# Patient Record
Sex: Male | Born: 1935 | Race: White | Hispanic: No | Smoking: Former smoker
Health system: Southern US, Community
[De-identification: ages and names within clinical notes are randomized; demographics above are authoritative.]

## PROBLEM LIST (undated history)

## (undated) DIAGNOSIS — Z860100 Personal history of colon polyps, unspecified: Secondary | ICD-10-CM

## (undated) DIAGNOSIS — I10 Essential (primary) hypertension: Secondary | ICD-10-CM

## (undated) DIAGNOSIS — K5792 Diverticulitis of intestine, part unspecified, without perforation or abscess without bleeding: Secondary | ICD-10-CM

## (undated) DIAGNOSIS — K219 Gastro-esophageal reflux disease without esophagitis: Secondary | ICD-10-CM

## (undated) DIAGNOSIS — Z8719 Personal history of other diseases of the digestive system: Secondary | ICD-10-CM

## (undated) DIAGNOSIS — G459 Transient cerebral ischemic attack, unspecified: Secondary | ICD-10-CM

## (undated) DIAGNOSIS — C801 Malignant (primary) neoplasm, unspecified: Secondary | ICD-10-CM

## (undated) HISTORY — PX: HERNIA REPAIR: SHX51

## (undated) HISTORY — PX: COLONOSCOPY: SHX174

## (undated) HISTORY — PX: SKIN CANCER EXCISION: SHX779

## (undated) HISTORY — PX: ESOPHAGOGASTRODUODENOSCOPY: SHX1529

---

## 2006-03-19 ENCOUNTER — Ambulatory Visit: Payer: Self-pay | Admitting: Unknown Physician Specialty

## 2008-08-17 ENCOUNTER — Ambulatory Visit: Payer: Self-pay | Admitting: Unknown Physician Specialty

## 2008-11-09 ENCOUNTER — Ambulatory Visit: Payer: Self-pay

## 2009-02-01 ENCOUNTER — Ambulatory Visit: Payer: Self-pay | Admitting: Neurology

## 2009-09-18 ENCOUNTER — Ambulatory Visit: Payer: Self-pay | Admitting: Internal Medicine

## 2009-09-22 ENCOUNTER — Ambulatory Visit: Payer: Self-pay | Admitting: Internal Medicine

## 2009-11-06 ENCOUNTER — Ambulatory Visit: Payer: Self-pay | Admitting: Internal Medicine

## 2012-02-01 ENCOUNTER — Emergency Department: Payer: Self-pay | Admitting: Emergency Medicine

## 2012-02-01 LAB — COMPREHENSIVE METABOLIC PANEL
Alkaline Phosphatase: 50 U/L (ref 50–136)
Anion Gap: 13 (ref 7–16)
BUN: 31 mg/dL — ABNORMAL HIGH (ref 7–18)
Calcium, Total: 8.5 mg/dL (ref 8.5–10.1)
Co2: 24 mmol/L (ref 21–32)
EGFR (Non-African Amer.): >60
Glucose: 98 mg/dL (ref 65–99)
Osmolality: 284 (ref 275–301)
Potassium: 4 mmol/L (ref 3.5–5.1)
Sodium: 139 mmol/L (ref 136–145)

## 2012-02-01 LAB — CBC
RBC: 3.79 10*6/uL — ABNORMAL LOW (ref 4.40–5.90)
RDW: 12.4 % (ref 11.5–14.5)
WBC: 10.4 10*3/uL (ref 3.8–10.6)

## 2012-02-01 LAB — URINALYSIS, COMPLETE
Bacteria: NONE SEEN
Blood: NEGATIVE
Glucose,UR: NEGATIVE mg/dL (ref 0–75)
Protein: NEGATIVE
Specific Gravity: 1.02 (ref 1.003–1.030)
WBC UR: 3 /HPF (ref 0–5)

## 2012-02-01 LAB — TROPONIN I: Troponin-I: <0.02 ng/mL

## 2012-08-26 ENCOUNTER — Other Ambulatory Visit: Payer: Self-pay | Admitting: Orthopedic Surgery

## 2012-08-26 DIAGNOSIS — M542 Cervicalgia: Secondary | ICD-10-CM

## 2012-08-30 ENCOUNTER — Ambulatory Visit
Admission: RE | Admit: 2012-08-30 | Discharge: 2012-08-30 | Disposition: A | Discharge status: Home / Self Care | Payer: Medicare Other | Source: Ambulatory Visit | Attending: Orthopedic Surgery | Admitting: Orthopedic Surgery

## 2012-08-30 DIAGNOSIS — M542 Cervicalgia: Secondary | ICD-10-CM

## 2012-08-31 ENCOUNTER — Other Ambulatory Visit: Payer: Self-pay

## 2013-05-08 ENCOUNTER — Emergency Department: Payer: Self-pay | Admitting: Emergency Medicine

## 2013-05-08 LAB — CBC
HGB: 12.9 g/dL — ABNORMAL LOW (ref 13.0–18.0)
MCHC: 33.8 g/dL (ref 32.0–36.0)
MCV: 94 fL (ref 80–100)
Platelet: 147 10*3/uL — ABNORMAL LOW (ref 150–440)
RBC: 4.06 10*6/uL — ABNORMAL LOW (ref 4.40–5.90)
RDW: 12.8 % (ref 11.5–14.5)

## 2013-05-08 LAB — URINALYSIS, COMPLETE
Bacteria: NONE SEEN
Bilirubin,UR: NEGATIVE
Ketone: NEGATIVE
Nitrite: NEGATIVE
Ph: 5 (ref 4.5–8.0)
RBC,UR: <1 /HPF (ref 0–5)
Specific Gravity: 1.019 (ref 1.003–1.030)
Squamous Epithelial: NONE SEEN

## 2013-05-08 LAB — COMPREHENSIVE METABOLIC PANEL
Anion Gap: 4 — ABNORMAL LOW (ref 7–16)
Calcium, Total: 8.5 mg/dL (ref 8.5–10.1)
Glucose: 111 mg/dL — ABNORMAL HIGH (ref 65–99)
Osmolality: 288 (ref 275–301)
Potassium: 3.9 mmol/L (ref 3.5–5.1)
Sodium: 142 mmol/L (ref 136–145)
Total Protein: 6.6 g/dL (ref 6.4–8.2)

## 2013-05-12 ENCOUNTER — Ambulatory Visit: Payer: Self-pay | Admitting: Internal Medicine

## 2013-05-25 ENCOUNTER — Ambulatory Visit: Payer: Self-pay | Admitting: Internal Medicine

## 2014-05-19 ENCOUNTER — Ambulatory Visit: Payer: Self-pay | Admitting: Unknown Physician Specialty

## 2014-05-20 LAB — PATHOLOGY REPORT

## 2014-07-18 ENCOUNTER — Ambulatory Visit: Payer: Self-pay | Admitting: Orthopedic Surgery

## 2016-02-08 ENCOUNTER — Encounter: Payer: Self-pay | Admitting: Emergency Medicine

## 2016-02-08 ENCOUNTER — Inpatient Hospital Stay
Admission: EM | Admit: 2016-02-08 | Discharge: 2016-02-11 | DRG: 436 | Disposition: A | Discharge status: Home / Self Care | Payer: Medicare Other | Attending: Internal Medicine | Admitting: Internal Medicine

## 2016-02-08 ENCOUNTER — Emergency Department: Payer: Medicare Other

## 2016-02-08 DIAGNOSIS — R630 Anorexia: Secondary | ICD-10-CM | POA: Diagnosis not present

## 2016-02-08 DIAGNOSIS — Z87891 Personal history of nicotine dependence: Secondary | ICD-10-CM | POA: Diagnosis not present

## 2016-02-08 DIAGNOSIS — Z6822 Body mass index (BMI) 22.0-22.9, adult: Secondary | ICD-10-CM | POA: Diagnosis not present

## 2016-02-08 DIAGNOSIS — B9789 Other viral agents as the cause of diseases classified elsewhere: Secondary | ICD-10-CM | POA: Diagnosis present

## 2016-02-08 DIAGNOSIS — I959 Hypotension, unspecified: Secondary | ICD-10-CM | POA: Diagnosis not present

## 2016-02-08 DIAGNOSIS — R17 Unspecified jaundice: Secondary | ICD-10-CM

## 2016-02-08 DIAGNOSIS — K759 Inflammatory liver disease, unspecified: Secondary | ICD-10-CM | POA: Diagnosis present

## 2016-02-08 DIAGNOSIS — N390 Urinary tract infection, site not specified: Secondary | ICD-10-CM | POA: Diagnosis present

## 2016-02-08 DIAGNOSIS — C259 Malignant neoplasm of pancreas, unspecified: Principal | ICD-10-CM | POA: Diagnosis present

## 2016-02-08 DIAGNOSIS — C787 Secondary malignant neoplasm of liver and intrahepatic bile duct: Secondary | ICD-10-CM | POA: Diagnosis present

## 2016-02-08 DIAGNOSIS — K8021 Calculus of gallbladder without cholecystitis with obstruction: Secondary | ICD-10-CM | POA: Diagnosis present

## 2016-02-08 DIAGNOSIS — N179 Acute kidney failure, unspecified: Secondary | ICD-10-CM | POA: Diagnosis present

## 2016-02-08 DIAGNOSIS — I10 Essential (primary) hypertension: Secondary | ICD-10-CM | POA: Diagnosis present

## 2016-02-08 DIAGNOSIS — Z8673 Personal history of transient ischemic attack (TIA), and cerebral infarction without residual deficits: Secondary | ICD-10-CM | POA: Diagnosis not present

## 2016-02-08 DIAGNOSIS — K831 Obstruction of bile duct: Secondary | ICD-10-CM | POA: Diagnosis present

## 2016-02-08 DIAGNOSIS — D490 Neoplasm of unspecified behavior of digestive system: Secondary | ICD-10-CM | POA: Diagnosis not present

## 2016-02-08 DIAGNOSIS — K769 Liver disease, unspecified: Secondary | ICD-10-CM | POA: Insufficient documentation

## 2016-02-08 DIAGNOSIS — E43 Unspecified severe protein-calorie malnutrition: Secondary | ICD-10-CM | POA: Insufficient documentation

## 2016-02-08 DIAGNOSIS — K838 Other specified diseases of biliary tract: Secondary | ICD-10-CM | POA: Diagnosis not present

## 2016-02-08 HISTORY — DX: Transient cerebral ischemic attack, unspecified: G45.9

## 2016-02-08 HISTORY — DX: Diverticulitis of intestine, part unspecified, without perforation or abscess without bleeding: K57.92

## 2016-02-08 HISTORY — DX: Essential (primary) hypertension: I10

## 2016-02-08 LAB — COMPREHENSIVE METABOLIC PANEL
ALK PHOS: 204 U/L — AB (ref 38–126)
ALT: 466 U/L — ABNORMAL HIGH (ref 17–63)
AST: 210 U/L — AB (ref 15–41)
Albumin: 4.1 g/dL (ref 3.5–5.0)
Anion gap: 11 (ref 5–15)
BILIRUBIN TOTAL: 9.3 mg/dL — AB (ref 0.3–1.2)
BUN: 62 mg/dL — AB (ref 6–20)
CALCIUM: 9.5 mg/dL (ref 8.9–10.3)
CO2: 24 mmol/L (ref 22–32)
Chloride: 103 mmol/L (ref 101–111)
Creatinine, Ser: 2.04 mg/dL — ABNORMAL HIGH (ref 0.61–1.24)
GFR calc Af Amer: 34 mL/min — ABNORMAL LOW (ref >60)
GFR, EST NON AFRICAN AMERICAN: 29 mL/min — AB (ref >60)
GLUCOSE: 117 mg/dL — AB (ref 65–99)
Potassium: 4.2 mmol/L (ref 3.5–5.1)
Sodium: 138 mmol/L (ref 135–145)
TOTAL PROTEIN: 7.5 g/dL (ref 6.5–8.1)

## 2016-02-08 LAB — URINALYSIS COMPLETE WITH MICROSCOPIC (ARMC ONLY)
Glucose, UA: NEGATIVE mg/dL
HGB URINE DIPSTICK: NEGATIVE
KETONES UR: NEGATIVE mg/dL
NITRITE: NEGATIVE
Protein, ur: 30 mg/dL — AB
Specific Gravity, Urine: 1.013 (ref 1.005–1.030)
Squamous Epithelial / LPF: NONE SEEN
pH: 5 (ref 5.0–8.0)

## 2016-02-08 LAB — CBC
HEMATOCRIT: 39 % — AB (ref 40.0–52.0)
Hemoglobin: 13.2 g/dL (ref 13.0–18.0)
MCH: 31.5 pg (ref 26.0–34.0)
MCHC: 33.7 g/dL (ref 32.0–36.0)
MCV: 93.4 fL (ref 80.0–100.0)
PLATELETS: 157 10*3/uL (ref 150–440)
RBC: 4.18 MIL/uL — ABNORMAL LOW (ref 4.40–5.90)
RDW: 13.8 % (ref 11.5–14.5)
WBC: 8.5 10*3/uL (ref 3.8–10.6)

## 2016-02-08 LAB — TROPONIN I

## 2016-02-08 LAB — PROTIME-INR
INR: 1.05
Prothrombin Time: 13.9 seconds (ref 11.4–15.0)

## 2016-02-08 LAB — LIPASE, BLOOD: LIPASE: 66 U/L — AB (ref 11–51)

## 2016-02-08 LAB — APTT: APTT: 26 s (ref 24–36)

## 2016-02-08 MED ORDER — SODIUM CHLORIDE 0.9 % IV BOLUS (SEPSIS)
1000.0000 mL | INTRAVENOUS | Status: AC
  Administered 2016-02-08: 1000 mL via INTRAVENOUS

## 2016-02-08 MED ORDER — ONDANSETRON HCL 4 MG/2ML IJ SOLN
4.0000 mg | Freq: Once | INTRAMUSCULAR | Status: AC
  Administered 2016-02-08: 4 mg via INTRAVENOUS
  Filled 2016-02-08: qty 2

## 2016-02-08 MED ORDER — MORPHINE SULFATE (PF) 4 MG/ML IV SOLN
4.0000 mg | Freq: Once | INTRAVENOUS | Status: AC
  Administered 2016-02-08: 4 mg via INTRAVENOUS
  Filled 2016-02-08: qty 1

## 2016-02-08 MED ORDER — DEXTROSE 5 % IV SOLN
1.0000 g | INTRAVENOUS | Status: AC
  Administered 2016-02-08: 1 g via INTRAVENOUS
  Filled 2016-02-08: qty 10

## 2016-02-08 NOTE — ED Notes (Addendum)
Pt transported to US via stretcher.  

## 2016-02-08 NOTE — ED Notes (Signed)
Pt returned from U/S via stretcher. 

## 2016-02-08 NOTE — H&P (Signed)
[place] at [place] NAME: Cody Andrade    MR#:  [id number]  DATE OF BIRTH:  [birth year]  DATE OF ADMISSION:  02/08/2016  PRIMARY CARE PHYSICIAN: Dr Loney Hering   REQUESTING/REFERRING PHYSICIAN: dr Karma Greaser  CHIEF COMPLAINT:  Not feeling well since beginning of the year, 20 pound weight loss, loss of appetite, abdominal pain  HISTORY OF PRESENT ILLNESS:  Cody Andrade  is a 82 y.o. male with a known history of hypertension, TIA comes to the emergency room from [place] clinic after he was found to be jaundiced and had hypotension. Patient has been not feeling well since been in the ER had a 20 pound weight loss along with significant loss of appetite and abdominal pain. He was recently treated with antibiotics as outpatient for acute diverticulitis. This did not seem to help with his abdominal pain. Workup in the emergency room showed patient and total bilirubin of 9.3 his LFTs were elevated and ultrasound showed biliary ductal dilatation with lesions in the liver suggestive of metastatic disease. He is being admitted for further evaluation and management. Case was discussed with Dr. Jenetta Downer from GI.  PAST MEDICAL HISTORY:   Past Medical History  Diagnosis Date  . TIA (transient ischemic attack)   . Hypertension     PAST SURGICAL HISTOIRY:  History reviewed. No pertinent past surgical history.  SOCIAL HISTORY:   Social History  Substance Use Topics  . Smoking status: Former Research scientist (life sciences)  . Smokeless tobacco: Not on file  . Alcohol Use: Yes    FAMILY HISTORY:  No family history on file.  DRUG ALLERGIES:   Allergies  Allergen Reactions  . Iodine Nausea Only  . Lodine [Etodolac] Nausea And Vomiting    REVIEW OF SYSTEMS:  Review of Systems  Constitutional: Positive for weight loss and malaise/fatigue. Negative for fever and chills.  HENT: Negative for ear discharge, ear pain and nosebleeds.   Eyes: Negative for blurred vision, pain and  discharge.       Jaundiced plus  Respiratory: Negative for sputum production, shortness of breath, wheezing and stridor.   Cardiovascular: Negative for chest pain, palpitations, orthopnea and PND.  Gastrointestinal: Positive for abdominal pain. Negative for nausea, vomiting and diarrhea.  Genitourinary: Negative for urgency and frequency.  Musculoskeletal: Negative for back pain and joint pain.  Neurological: Positive for weakness. Negative for sensory change, speech change and focal weakness.  Psychiatric/Behavioral: Negative for depression and hallucinations. The patient is not nervous/anxious.      MEDICATIONS AT HOME:   Prior to Admission medications   Not on File      VITAL SIGNS:  Blood pressure 124/70, pulse 74, temperature 97.8 F (36.6 C), temperature source Oral, resp. rate 14, height 5\' 11"  (1.803 m), weight 75.751 kg (167 lb), SpO2 100 %.  PHYSICAL EXAMINATION:  GENERAL:  82 y.o.-year-old patient lying in the bed with no acute distress.  EYES: Pupils equal, round, reactive to light and accommodation. Positive scleral icterus. Extraocular muscles intact.  HEENT: Head atraumatic, normocephalic. Oropharynx and nasopharynx clear.  NECK:  Supple, no jugular venous distention. No thyroid enlargement, no tenderness.  LUNGS: Normal breath sounds bilaterally, no wheezing, rales,rhonchi or crepitation. No use of accessory muscles of respiration.  CARDIOVASCULAR: S1, S2 normal. No murmurs, rubs, or gallops.  ABDOMEN: Soft, nontender, nondistended. Bowel sounds present. No organomegaly or mass.  EXTREMITIES: No pedal edema, cyanosis, or clubbing.  NEUROLOGIC: Cranial nerves II through XII are intact. Muscle strength 5/5 in all  extremities. Sensation intact. Gait not checked.  PSYCHIATRIC: The patient is alert and oriented x 3.  SKIN: No obvious rash, lesion, or ulcer.   LABORATORY PANEL:   CBC  Recent Labs Lab 02/08/16 1558  WBC 8.5  HGB 13.2  HCT 39.0*  PLT 157    ------------------------------------------------------------------------------------------------------------------  Chemistries   Recent Labs Lab 02/08/16 1558  NA 138  K 4.2  CL 103  CO2 24  GLUCOSE 117*  BUN 62*  CREATININE 2.04*  CALCIUM 9.5  AST 210*  ALT 466*  ALKPHOS 204*  BILITOT 9.3*   ------------------------------------------------------------------------------------------------------------------  Cardiac Enzymes  Recent Labs Lab 02/08/16 1558  TROPONINI <0.03   ------------------------------------------------------------------------------------------------------------------  RADIOLOGY:  US Abdomen Limited Ruq  02/08/2016  CLINICAL DATA:  Abdominal pain and elevated LFTs for several weeks EXAM: US ABDOMEN LIMITED - RIGHT UPPER QUADRANT COMPARISON:  None. FINDINGS: Gallbladder: Well distended with echogenic material floating within the bile consistent with gallbladder sludge and likely small stones. No wall thickening or pericholecystic fluid is noted. Common bile duct: Diameter: Dilated to 11.7 mm. Liver: Multiple hypoechoic lesions are identified within the liver. These are suspicious for underlying metastatic disease. IMPRESSION: Biliary ductal dilatation as well as changes suggestive of hepatic metastatic disease. This may be related to an underlying pancreatic lesion. CT of the abdomen and pelvis with contrast (both oral and IV) is recommended. Electronically Signed   By: Inez Catalina M.D.   On: 02/08/2016 20:12    EKG:    IMPRESSION AND PLAN:   Cody Andrade  is a 82 y.o. male with a known history of hypertension, TIA comes to the emergency room from [facility] clinic after he was found to be jaundiced and had hypotension. Patient has been not feeling well since been in the ER had a 20 pound weight loss along with significant loss of appetite and abdominal pain.  1. Obstructive jaundice with abnormal ultrasound of the abdomen -Patient presented with  weakness, malaise, loss of appetite, weight loss of 20 pounds in 2 months -Admit to medical floor -Ultrasound revealed biliary duct without limitation with lesions in the liver suggestive of possible pancreatic mass -Case discussed with Dr. Candace Cruise from GI who plans doing ERCP in the morning. Before avoid further scanning  2. Acute renal failure -IV fluids for hydration. -Patient has normal baseline creatinine comes in with creatinine of 2.04 -Avoid nephrotoxins, monitor I's and O's, monitor creatinine  3. Hypertension we'll resume home meds Pharmacy tech to place in meds in the morning. -Patient has related hypotension at present. Came in with 90/51 improve with IV hydration to 124/70  4. History of TIA We'll hold off on Aggrenox given ERCP for tomorrow  5. DVT prophylaxis Lovenox once procedure is over tomorrow  The above was discussed with patient and patient's wife and daughter were present in the emergency room   All the records are reviewed and case discussed with ED provider. Management plans discussed with the patient, family and they are in agreement.  CODE STATUS: **Full  TOTAL TIME TAKING CARE OF THIS PATIENT: 50* minutes.    Demerius Podolak M.D on 02/08/2016 at 9:31 PM  Between 7am to 6pm - Pager - [phone]  After 6pm go to [email or web address] - password [email or web address] [facility]  [facility]  Office  [phone]  CC: Primary care physician; No primary care provider on file.

## 2016-02-08 NOTE — ED Notes (Signed)
Brought over from [facility] hx of abd pain with constipation  They noticed vertigo   And low blood pressure

## 2016-02-08 NOTE — ED Provider Notes (Signed)
[facility] Emergency Department Provider Note  ____________________________________________  Time seen: Approximately 6:37 PM  I have reviewed the triage vital signs and the nursing notes.   HISTORY  Chief Complaint Abdominal Pain    HPI Cody Andrade is a 82 y.o. male who is relatively healthy for his age with a history of a prior TIA and hypertension who is sent over from [place] clinic for evaluation of abdominal pain, constipation, hypotension, and jaundice.  The patient reports that he has not been feeling well for weeks.  He was treated for probable diverticulitis about 3 weeks ago and completed his course of treatment about 5 days ago, but he states that he did not feel any better after the medications, and in fact feels worse.  He is frequently nauseated and he describes left lower quadrant and middle abdominal pain that is aching, sometimes sharp, and severe.  It is worse at night and after he eats.  He does have nausea but he has not been vomiting.  He has had minimal stools but he thinks it is because he is not having much to eat or drink.  Over the last few days he reports substantially decreased by mouth intake.  He denies fever/chills, chest pain, shortness of breath, dysuria.  He does report that his urine has been very dark.   Past Medical History  Diagnosis Date  . TIA (transient ischemic attack)   . Hypertension     There are no active problems to display for this patient.   History reviewed. No pertinent past surgical history.  No current outpatient prescriptions on file.  Allergies Lodine  No family history on file.  Social History Social History  Substance Use Topics  . Smoking status: Former Research scientist (life sciences)  . Smokeless tobacco: None  . Alcohol Use: Yes    Review of Systems Constitutional: No fever/chills.  Generalized weakness Eyes: No visual changes. ENT: No sore throat. Cardiovascular: Denies chest pain. Respiratory: Denies  shortness of breath. Gastrointestinal: Left lower quadrant and central abdominal pain with nausea, anorexia, and decreased bowel movements. Genitourinary: Negative for dysuria.  Dark colored urine. Musculoskeletal: Recent development of low back pain, bilateral. Skin: Negative for rash. Neurological: Negative for headaches, focal weakness or numbness.  10-point ROS otherwise negative.  ____________________________________________   PHYSICAL EXAM:  VITAL SIGNS: ED Triage Vitals  Enc Vitals Group     BP 02/08/16 1548 90/51 mmHg     Pulse Rate 02/08/16 1548 83     Resp 02/08/16 1548 20     Temp 02/08/16 1548 97.8 F (36.6 C)     Temp Source 02/08/16 1548 Oral     SpO2 02/08/16 1548 100 %     Weight 02/08/16 1548 167 lb (75.751 kg)     Height 02/08/16 1548 5\' 11"  (1.803 m)     Head Cir --      Peak Flow --      Pain Score 02/08/16 1549 3     Pain Loc --      Pain Edu? --      Excl. in [place]? --     Constitutional: Alert and oriented. No acute distress.  Jaundiced Eyes: Scleral icterus. PERRL. EOMI.   Head: Atraumatic. Nose: No congestion/rhinnorhea. Mouth/Throat: Mucous membranes are dry.  Oropharynx non-erythematous. Neck: No stridor.   Cardiovascular: Normal rate, regular rhythm. Grossly normal heart sounds.  Good peripheral circulation. Respiratory: Normal respiratory effort.  No retractions. Lungs CTAB. Gastrointestinal: Soft with tenderness to palpation of the  right side of his abdomen but a negative Murphy's sign.  No tenderness at McBurney's point.  No tenderness to palpation of the left lower quadrant.  No rebound nor guarding. Musculoskeletal: No lower extremity tenderness nor edema.  No joint effusions. Neurologic:  Normal speech and language. No gross focal neurologic deficits are appreciated.  Skin:  Skin is warm, dry and intact. No rash noted.  Jaundiced. Psychiatric: Mood and affect are normal. Speech and behavior are  normal.  ____________________________________________   LABS (all labs ordered are listed, but only abnormal results are displayed)  Labs Reviewed  LIPASE, BLOOD - Abnormal; Notable for the following:    Lipase 66 (*)    All other components within normal limits  COMPREHENSIVE METABOLIC PANEL - Abnormal; Notable for the following:    Glucose, Bld 117 (*)    BUN 62 (*)    Creatinine, Ser 2.04 (*)    AST 210 (*)    ALT 466 (*)    Alkaline Phosphatase 204 (*)    Total Bilirubin 9.3 (*)    GFR calc non Af Amer 29 (*)    GFR calc Af Amer 34 (*)    All other components within normal limits  CBC - Abnormal; Notable for the following:    RBC 4.18 (*)    HCT 39.0 (*)    All other components within normal limits  URINALYSIS COMPLETEWITH MICROSCOPIC ([facility] ONLY) - Abnormal; Notable for the following:    Color, Urine AMBER (*)    APPearance HAZY (*)    Bilirubin Urine 1+ (*)    Protein, ur 30 (*)    Leukocytes, UA 1+ (*)    Bacteria, UA RARE (*)    All other components within normal limits  URINE CULTURE  TROPONIN I  PROTIME-INR  APTT   ____________________________________________  EKG  ED ECG REPORT I, Vitaly Wanat, the attending physician, personally viewed and interpreted this ECG.  Date: 02/08/2016 EKG Time: 20:42 Rate: 80 Rhythm: normal sinus rhythm QRS Axis: normal Intervals: normal ST/T Wave abnormalities: normal Conduction Disturbances: none Narrative Interpretation: unremarkable  ____________________________________________  RADIOLOGY   US Abdomen Limited Ruq  02/08/2016  CLINICAL DATA:  Abdominal pain and elevated LFTs for several weeks EXAM: US ABDOMEN LIMITED - RIGHT UPPER QUADRANT COMPARISON:  None. FINDINGS: Gallbladder: Well distended with echogenic material floating within the bile consistent with gallbladder sludge and likely small stones. No wall thickening or pericholecystic fluid is noted. Common bile duct: Diameter: Dilated to 11.7 mm. Liver:  Multiple hypoechoic lesions are identified within the liver. These are suspicious for underlying metastatic disease. IMPRESSION: Biliary ductal dilatation as well as changes suggestive of hepatic metastatic disease. This may be related to an underlying pancreatic lesion. CT of the abdomen and pelvis with contrast (both oral and IV) is recommended. Electronically Signed   By: Inez Catalina M.D.   On: 02/08/2016 20:12    ____________________________________________   PROCEDURES  Procedure(s) performed: None  Critical Care performed: No ____________________________________________   INITIAL IMPRESSION / ASSESSMENT AND PLAN / ED COURSE  Pertinent labs & imaging results that were available during my care of the patient were reviewed by me and considered in my medical decision making (see chart for details).  The patient is in no acute distress but has hypotension, acute renal failure, and a severely elevated total bilirubin.  The tenderness palpation of the right side of his abdomen is also concerning although he has no Murphy's sign.  Given his acute renal failure I will  start with a right upper quadrant ultrasound and I am concerned for the possibility of neoplasm.  I am giving 1 L normal saline and ceftriaxone 1 g IV for his UTI and a urine culture was ordered.  ----------------------------------------- 8:54 PM on 02/08/2016 -----------------------------------------  The patient remains comfortable.  His ultrasound is very concerning for metastatic cancer, likely pancreatic source.  Given his renal failure there is no point in pursuing a CT scan at this time; he needs to be rehydrated and see if his creatinine comes down by tomorrow when a more appropriate imaging study can be obtained, although it is possible he has hepatorenal syndrome and his creatinine will improve.  Regardless, I will admit him for further management of his renal failure, abdominal pain, UTI, and probable new diagnosis of  pancreatic cancer with metastases to the liver.  I updated the patient and his family.  I have spoken with Dr. Posey Pronto the hospitalist who will admit.  ____________________________________________  FINAL CLINICAL IMPRESSION(S) / ED DIAGNOSES  Final diagnoses:  Acute renal failure, unspecified acute renal failure type ([place])  Total bilirubin, elevated  Hepatitis  Pancreatic cancer metastasized to liver [facility])  UTI (lower urinary tract infection)      NEW MEDICATIONS STARTED DURING THIS VISIT:  New Prescriptions   No medications on file      Note:  This document was prepared using Dragon voice recognition software and may include unintentional dictation errors.   Hinda Kehr, MD 02/08/16 2055

## 2016-02-08 NOTE — ED Notes (Signed)
Report given to Kate, RN

## 2016-02-09 ENCOUNTER — Encounter
Admission: EM | Disposition: A | Discharge status: Home / Self Care | Payer: Self-pay | Source: Home / Self Care | Attending: Internal Medicine

## 2016-02-09 ENCOUNTER — Inpatient Hospital Stay: Payer: Medicare Other | Admitting: Anesthesiology

## 2016-02-09 DIAGNOSIS — E43 Unspecified severe protein-calorie malnutrition: Secondary | ICD-10-CM | POA: Insufficient documentation

## 2016-02-09 DIAGNOSIS — D490 Neoplasm of unspecified behavior of digestive system: Secondary | ICD-10-CM

## 2016-02-09 DIAGNOSIS — K831 Obstruction of bile duct: Secondary | ICD-10-CM

## 2016-02-09 HISTORY — PX: ERCP: SHX5425

## 2016-02-09 LAB — BASIC METABOLIC PANEL
ANION GAP: 9 (ref 5–15)
BUN: 59 mg/dL — ABNORMAL HIGH (ref 6–20)
CO2: 24 mmol/L (ref 22–32)
Calcium: 8.4 mg/dL — ABNORMAL LOW (ref 8.9–10.3)
Chloride: 107 mmol/L (ref 101–111)
Creatinine, Ser: 1.72 mg/dL — ABNORMAL HIGH (ref 0.61–1.24)
GFR calc Af Amer: 41 mL/min — ABNORMAL LOW (ref >60)
GFR, EST NON AFRICAN AMERICAN: 36 mL/min — AB (ref >60)
Glucose, Bld: 96 mg/dL (ref 65–99)
POTASSIUM: 3.8 mmol/L (ref 3.5–5.1)
SODIUM: 140 mmol/L (ref 135–145)

## 2016-02-09 SURGERY — ERCP, WITH INTERVENTION IF INDICATED
Anesthesia: General

## 2016-02-09 MED ORDER — ONDANSETRON HCL 4 MG PO TABS: 4.0000 mg | ORAL_TABLET | Freq: Four times a day (QID) | ORAL | Status: DC | PRN

## 2016-02-09 MED ORDER — MIDAZOLAM HCL 5 MG/5ML IJ SOLN
INTRAMUSCULAR | Status: DC | PRN
  Administered 2016-02-09: 1 mg via INTRAVENOUS

## 2016-02-09 MED ORDER — LIDOCAINE HCL (CARDIAC) 20 MG/ML IV SOLN
INTRAVENOUS | Status: DC | PRN
  Administered 2016-02-09: 100 mg via INTRAVENOUS

## 2016-02-09 MED ORDER — INDOMETHACIN 50 MG RE SUPP
100.0000 mg | RECTAL | Status: AC
  Administered 2016-02-09: 50 mg via RECTAL
  Filled 2016-02-09: qty 2

## 2016-02-09 MED ORDER — ACETAMINOPHEN 325 MG PO TABS: 650.0000 mg | ORAL_TABLET | Freq: Four times a day (QID) | ORAL | Status: DC | PRN

## 2016-02-09 MED ORDER — SODIUM CHLORIDE 0.9 % IV SOLN
1.5000 g | Freq: Once | INTRAVENOUS | Status: AC
  Administered 2016-02-09: 1.5 g via INTRAVENOUS
  Filled 2016-02-09: qty 1.5

## 2016-02-09 MED ORDER — SENNOSIDES-DOCUSATE SODIUM 8.6-50 MG PO TABS: 1.0000 | ORAL_TABLET | Freq: Every evening | ORAL | Status: DC | PRN

## 2016-02-09 MED ORDER — ACETAMINOPHEN 650 MG RE SUPP: 650.0000 mg | Freq: Four times a day (QID) | RECTAL | Status: DC | PRN

## 2016-02-09 MED ORDER — ZOLPIDEM TARTRATE 5 MG PO TABS
5.0000 mg | ORAL_TABLET | Freq: Every evening | ORAL | Status: DC | PRN
  Administered 2016-02-09 – 2016-02-10 (×3): 5 mg via ORAL
  Filled 2016-02-09 (×3): qty 1

## 2016-02-09 MED ORDER — IPRATROPIUM-ALBUTEROL 0.5-2.5 (3) MG/3ML IN SOLN
3.0000 mL | Freq: Once | RESPIRATORY_TRACT | Status: AC
  Administered 2016-02-09: 3 mL via RESPIRATORY_TRACT

## 2016-02-09 MED ORDER — FENTANYL CITRATE (PF) 100 MCG/2ML IJ SOLN
INTRAMUSCULAR | Status: DC | PRN
  Administered 2016-02-09: 50 ug via INTRAVENOUS

## 2016-02-09 MED ORDER — MORPHINE SULFATE (PF) 2 MG/ML IV SOLN
1.0000 mg | INTRAVENOUS | Status: DC | PRN
  Administered 2016-02-09 (×2): 1 mg via INTRAVENOUS
  Filled 2016-02-09 (×2): qty 1

## 2016-02-09 MED ORDER — ENOXAPARIN SODIUM 30 MG/0.3ML ~~LOC~~ SOLN
30.0000 mg | SUBCUTANEOUS | Status: DC
Start: 2016-02-09 — End: 2016-02-10
  Administered 2016-02-09: 18:00:00 30 mg via SUBCUTANEOUS
  Filled 2016-02-09: qty 0.3

## 2016-02-09 MED ORDER — PROPOFOL 500 MG/50ML IV EMUL
INTRAVENOUS | Status: DC | PRN
  Administered 2016-02-09: 120 ug/kg/min via INTRAVENOUS

## 2016-02-09 MED ORDER — ONDANSETRON HCL 4 MG/2ML IJ SOLN: 4.0000 mg | Freq: Four times a day (QID) | INTRAMUSCULAR | Status: DC | PRN

## 2016-02-09 MED ORDER — OXYCODONE HCL 5 MG PO TABS
5.0000 mg | ORAL_TABLET | ORAL | Status: DC | PRN
  Administered 2016-02-09 – 2016-02-10 (×2): 5 mg via ORAL
  Filled 2016-02-09 (×2): qty 1

## 2016-02-09 MED ORDER — GLYCOPYRROLATE 0.2 MG/ML IJ SOLN
INTRAMUSCULAR | Status: DC | PRN
  Administered 2016-02-09: .1 mg via INTRAVENOUS

## 2016-02-09 MED ORDER — ASPIRIN-DIPYRIDAMOLE ER 25-200 MG PO CP12
1.0000 | ORAL_CAPSULE | Freq: Two times a day (BID) | ORAL | Status: DC
  Administered 2016-02-10 – 2016-02-11 (×3): 1 via ORAL
  Filled 2016-02-09 (×3): qty 1

## 2016-02-09 MED ORDER — SODIUM CHLORIDE 0.9 % IV SOLN
INTRAVENOUS | Status: DC
  Administered 2016-02-09 (×3): via INTRAVENOUS

## 2016-02-09 MED ORDER — SODIUM CHLORIDE 0.9 % IV SOLN: INTRAVENOUS | Status: DC

## 2016-02-09 NOTE — Progress Notes (Signed)
[place] at [place] NAME: Cody Andrade    MR#:  [id number]  DATE OF BIRTH:  Jun 01, 1933  SUBJECTIVE:  CHIEF COMPLAINT:  Patient is resting comfortably. Abdominal pain is manageable with the current pain medications. Nothing by mouth for the procedure  REVIEW OF SYSTEMS:  CONSTITUTIONAL: No fever, fatigue or weakness.  EYES: No blurred or double vision.  EARS, NOSE, AND THROAT: No tinnitus or ear pain.  RESPIRATORY: No cough, shortness of breath, wheezing or hemoptysis.  CARDIOVASCULAR: No chest pain, orthopnea, edema.  GASTROINTESTINAL: No nausea, vomiting, diarrhea or abdominal pain.  GENITOURINARY: No dysuria, hematuria.  ENDOCRINE: No polyuria, nocturia,  HEMATOLOGY: No anemia, easy bruising or bleeding SKIN: No rash or lesion. MUSCULOSKELETAL: No joint pain or arthritis.   NEUROLOGIC: No tingling, numbness, weakness.  PSYCHIATRY: No anxiety or depression.   DRUG ALLERGIES:   Allergies  Allergen Reactions  . Iodine Nausea Only  . Lodine [Etodolac] Nausea And Vomiting    VITALS:  Blood pressure 123/53, pulse 77, temperature 98.4 F (36.9 C), temperature source Oral, resp. rate 17, height 5\' 11"  (1.803 m), weight 74.526 kg (164 lb 4.8 oz), SpO2 99 %.  PHYSICAL EXAMINATION:  GENERAL:  82 y.o.-year-old patient lying in the bed with no acute distress.  EYES: Pupils equal, round, reactive to light and accommodation. No scleral icterus. Extraocular muscles intact.  HEENT: Head atraumatic, normocephalic. Oropharynx and nasopharynx clear.  NECK:  Supple, no jugular venous distention. No thyroid enlargement, no tenderness.  LUNGS: Normal breath sounds bilaterally, no wheezing, rales,rhonchi or crepitation. No use of accessory muscles of respiration.  CARDIOVASCULAR: S1, S2 normal. No murmurs, rubs, or gallops.  ABDOMEN: Soft, some epigastric tenderness noted, no rebound tenderness, nondistended. Bowel sounds present. No  organomegaly or mass.  EXTREMITIES: No pedal edema, cyanosis, or clubbing.  NEUROLOGIC: Cranial nerves II through XII are intact. Muscle strength 5/5 in all extremities. Sensation intact. Gait not checked.  PSYCHIATRIC: The patient is alert and oriented x 3.  SKIN: No obvious rash, lesion, or ulcer.    LABORATORY PANEL:   CBC  Recent Labs Lab 02/08/16 1558  WBC 8.5  HGB 13.2  HCT 39.0*  PLT 157   ------------------------------------------------------------------------------------------------------------------  Chemistries   Recent Labs Lab 02/08/16 1558 02/09/16 0624  NA 138 140  K 4.2 3.8  CL 103 107  CO2 24 24  GLUCOSE 117* 96  BUN 62* 59*  CREATININE 2.04* 1.72*  CALCIUM 9.5 8.4*  AST 210*  --   ALT 466*  --   ALKPHOS 204*  --   BILITOT 9.3*  --    ------------------------------------------------------------------------------------------------------------------  Cardiac Enzymes  Recent Labs Lab 02/08/16 1558  TROPONINI <0.03   ------------------------------------------------------------------------------------------------------------------  RADIOLOGY:  US Abdomen Limited Ruq  02/08/2016  CLINICAL DATA:  Abdominal pain and elevated LFTs for several weeks EXAM: US ABDOMEN LIMITED - RIGHT UPPER QUADRANT COMPARISON:  None. FINDINGS: Gallbladder: Well distended with echogenic material floating within the bile consistent with gallbladder sludge and likely small stones. No wall thickening or pericholecystic fluid is noted. Common bile duct: Diameter: Dilated to 11.7 mm. Liver: Multiple hypoechoic lesions are identified within the liver. These are suspicious for underlying metastatic disease. IMPRESSION: Biliary ductal dilatation as well as changes suggestive of hepatic metastatic disease. This may be related to an underlying pancreatic lesion. CT of the abdomen and pelvis with contrast (both oral and IV) is recommended. Electronically Signed   By: Inez Catalina M.D.    On:  02/08/2016 20:12    EKG:   Orders placed or performed during the hospital encounter of 02/08/16  . ED EKG  . ED EKG  . EKG 12-Lead  . EKG 12-Lead    ASSESSMENT AND PLAN:   Mihcael Andrade is a 82 y.o. male with a known history of hypertension, TIA comes to the emergency room from [facility] clinic after he was found to be jaundiced and had hypotension. Patient has been not feeling well since been in the ER had a 20 pound weight loss along with significant loss of appetite and abdominal pain.  1. Obstructive jaundice with abnormal ultrasound of the abdomen -Patient presented with weakness, malaise, loss of appetite, weight loss of 20 pounds in 2 months -Scheduled for ERCP today -Ultrasound revealed biliary duct without limitation with lesions in the liver suggestive of possible pancreatic mass -Follow-up with gastroenterology   2. Acute renal failure -IV fluids for hydration. -Clinically improving -Patient has normal baseline creatinine comes in with creatinine of 2.04--1.72 -Avoid nephrotoxins, monitor I's and O's, monitor creatinine  3. Hypertension we'll resume home meds Pharmacy tech to place in meds in the morning. -Patient has related hypotension at present. Came in with 90/51 improve with IV hydration to 124/70  4. History of TIA We'll hold off on Aggrenox given ERCP today, can be resumed from tomorrow after procedure  5. DVT prophylaxis Lovenox once procedure is over tomorrow     All the records are reviewed and case discussed with Care Management/Social Workerr. Management plans discussed with the patient, family and they are in agreement.  CODE STATUS: fc  TOTAL TIME TAKING CARE OF THIS PATIENT: 34minutes.   POSSIBLE D/C IN 2-3 DAYS, DEPENDING ON CLINICAL CONDITION.   [name] M.D on 02/09/2016 at 2:37 PM  Between 7am to 6pm - Pager - [phone] After 6pm go to [email or web address] - password [email or web address] [facility]  [facility]  Office   [phone]  CC: Primary care physician; No primary care provider on file.

## 2016-02-09 NOTE — Op Note (Signed)
[facility] Gastroenterology Patient Name: Cody Andrade Procedure Date: 02/09/2016 3:28 PM MRN: [id number] Account #: [id number] Date of Birth: [birth year] Admit Type: Inpatient Age: 82 Room: [facility] ENDO ROOM 4 Gender: Male Note Status: Finalized Procedure:            ERCP Indications:          Jaundice, Tumor of the head of pancreas Providers:            Lucilla Lame, MD Referring MD:         Caprice Renshaw (Referring MD) Medicines:            Propofol per Anesthesia Complications:        No immediate complications. Procedure:            Pre-Anesthesia Assessment:                       - Prior to the procedure, a History and Physical was                        performed, and patient medications and allergies were                        reviewed. The patient's tolerance of previous                        anesthesia was also reviewed. The risks and benefits of                        the procedure and the sedation options and risks were                        discussed with the patient. All questions were                        answered, and informed consent was obtained. Prior                        Anticoagulants: The patient has taken no previous                        anticoagulant or antiplatelet agents. ASA Grade                        Assessment: II - A patient with mild systemic disease.                        After reviewing the risks and benefits, the patient was                        deemed in satisfactory condition to undergo the                        procedure.                       After obtaining informed consent, the scope was passed                        under direct vision. Throughout the procedure, the  patient's blood pressure, pulse, and oxygen saturations                        were monitored continuously. The Enteroscope was                        introduced through the mouth, and used to inject   contrast into and used to inject contrast into the bile                        duct. The ERCP was accomplished with ease. The patient                        tolerated the procedure well. Findings:      The scout film was normal. The esophagus was successfully intubated       under direct vision. The scope was advanced to a normal major papilla in       the descending duodenum without detailed examination of the pharynx,       larynx and associated structures, and upper GI tract. The upper GI tract       was grossly normal. The bile duct was deeply cannulated. Contrast was       injected. I personally interpreted the bile duct images. There was brisk       flow of contrast through the ducts. Image quality was excellent.       Contrast extended to the entire biliary tree. The middle third of the       main bile duct and upper third of the main bile duct were diffusely       dilated, acquired. The lower third of the main bile duct contained a       single segmental stenosis. A wire was passed into the biliary tree.       Biliary sphincterotomy was made with a traction (standard)       sphincterotome using ERBE electrocautery. There was no       post-sphincterotomy bleeding. One 10 Fr by 8 cm metal stent was placed 6       cm into the common bile duct. Bile flowed through the stent. The stent       was in good position. 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                       - The upper third of the main bile duct and middle                        third of the main bile duct were dilated, acquired.                       - A biliary sphincterotomy was performed.                       - One metal stent was placed into the common bile duct. Recommendation:       - Watch for pancreatitis, bleeding, perforation, and                        cholangitis. Procedure Code(s):    --- Professional ---  [id number], Endoscopic retrograde  cholangiopancreatography                        (ERCP); with placement of endoscopic stent into biliary                        or pancreatic duct, including pre- and post-dilation                        and guide wire passage, when performed, including                        sphincterotomy, when performed, each stent                       [id number], Endoscopic catheterization of the biliary ductal                        system, radiological supervision and interpretation Diagnosis Code(s):    --- Professional ---                       D49.0, Neoplasm of unspecified behavior of digestive                        system                       R17, Unspecified jaundice                       K83.1, Obstruction of bile duct CPT copyright 2016 [employer]. All rights reserved. The codes documented in this report are preliminary and upon coder review may  be revised to meet current compliance requirements. Lucilla Lame, MD 02/09/2016 4:15:01 PM This report has been signed electronically. Number of Addenda: 0 Note Initiated On: 02/09/2016 3:28 PM      [facility]

## 2016-02-09 NOTE — Progress Notes (Signed)
Initial Nutrition Assessment  DOCUMENTATION CODES:   Severe malnutrition in context of acute illness/injury  INTERVENTION:   Coordination of Care: await diet progression as medically able Medical Food Supplement Therapy: will recommend Ensure Enlive po BID, each supplement provides 350 kcal and 20 grams of protein, once diet advanced   NUTRITION DIAGNOSIS:   Malnutrition related to poor appetite as evidenced by energy intake < or equal to 50% for > or equal to 5 days, mild depletion of muscle mass, moderate depletions of muscle mass, percent weight loss.  GOAL:   Patient will meet greater than or equal to 90% of their needs  MONITOR:    (Energy Intake, Electrolyte and renal Profile, Anthropometrics, Hepatic Profile)  REASON FOR ASSESSMENT:   Malnutrition Screening Tool    ASSESSMENT:   Pt admitted with obstructive jaundice scheduled for ERCP 3/3. Per MD note, pt with lesions in liver suggestive of metastatic disease.  Past Medical History  Diagnosis Date  . TIA (transient ischemic attack)   . Hypertension     Diet Order:  Diet NPO time specified    Current Nutrition: Pt NPO  Food/Nutrition-Related History: Pt reports poor po intake for 3-3.5 weeks. Pt reports sometimes being able to tolerate some soups or sandwiches. Pt reports if he makes a sandwich he will end up throwing 2/3 out because he has no appetite. Pt reports eating very little. RD notes pt drinks daily per MD note.   Scheduled Medications:  . ampicillin-sulbactam (UNASYN) 1.5 g IVPB  1.5 g Intravenous Once    Continuous Medications:  . sodium chloride 100 mL/hr at 02/09/16 1034  . sodium chloride       Electrolyte/Renal Profile and Glucose Profile:   Recent Labs Lab 02/08/16 1558 02/09/16 0624  NA 138 140  K 4.2 3.8  CL 103 107  CO2 24 24  BUN 62* 59*  CREATININE 2.04* 1.72*  CALCIUM 9.5 8.4*  GLUCOSE 117* 96   Protein Profile:  Recent Labs Lab 02/08/16 1558  ALBUMIN 4.1     Gastrointestinal Profile: Last BM:   Nutrition-Focused Physical Exam Findings: Nutrition-Focused physical exam completed. Findings are no fat depletion, mild-moderate muscle depletion, and no edema.     Weight Change: Pt reports weight of 185-190lbs right before Christmas (11% weight loss in 2 months)   Last BM:  02/06/2016  Height:   Ht Readings from Last 1 Encounters:  02/09/16 5\' 11"  (1.803 m)    Weight:   Wt Readings from Last 1 Encounters:  02/09/16 164 lb 4.8 oz (74.526 kg)     BMI:  Body mass index is 22.93 kg/(m^2).  Estimated Nutritional Needs:   Kcal:  BEE: 1472kcals, TEE: (IF 1.1-1.3)(AF 1.2) 1493-2297kcals  Protein:  75-89g protein (1.0-1.2g/kg)  Fluid:  1863-2223mL of fluid (25-24mL/kg)  EDUCATION NEEDS:   Education needs no appropriate at this time   Avis, RD, LDN Pager [phone] Weekend/On-Call Pager [phone]

## 2016-02-09 NOTE — Anesthesia Postprocedure Evaluation (Signed)
Anesthesia Post Note  Patient: Cody Andrade  Procedure(s) Performed: Procedure(s) (LRB): ENDOSCOPIC RETROGRADE CHOLANGIOPANCREATOGRAPHY (ERCP) (N/A)  Patient location during evaluation: PACU Anesthesia Type: General Level of consciousness: awake and alert and oriented Pain management: pain level controlled Vital Signs Assessment: post-procedure vital signs reviewed and stable Respiratory status: spontaneous breathing Cardiovascular status: blood pressure returned to baseline Anesthetic complications: no    Last Vitals:  Filed Vitals:   02/09/16 1644 02/09/16 1654  BP: 138/71 148/77  Pulse: 87 79  Temp:    Resp: 19 20    Last Pain:  Filed Vitals:   02/09/16 1758  PainSc: 5                  Kirin Pastorino

## 2016-02-09 NOTE — Care Management (Signed)
Admitted to [facility] with the diagnosis of obstructive jaundice. Lives alone. Daughter is Wyona Almas. Legally separated from wife. Last seen Dr. Loney Hering about 2 weeks ago. No Home Health. No skilled facility. No home oxygen. Uses no aids for ambulation. Takes care of all basic and instrumental activities of daily living himself, drives. No falls. Decreased appetite x 3 weeks. Drinks daily. Shelbie Ammons RN MSN CCM Care Management [phone]

## 2016-02-09 NOTE — Anesthesia Preprocedure Evaluation (Signed)
Anesthesia Evaluation  Patient identified by MRN, date of birth, ID band Patient awake    Reviewed: Allergy & Precautions, H&P , NPO status , Patient's Chart, lab work & pertinent test results  History of Anesthesia Complications Negative for: history of anesthetic complications  Airway Mallampati: III  TM Distance: >3 FB Neck ROM: limited    Dental  (+) Poor Dentition, Chipped   Pulmonary neg shortness of breath, former smoker,    Pulmonary exam normal breath sounds clear to auscultation       Cardiovascular Exercise Tolerance: Good hypertension, (-) angina(-) Past MI and (-) DOE Normal cardiovascular exam Rhythm:regular Rate:Normal     Neuro/Psych TIAnegative psych ROS   GI/Hepatic negative GI ROS, Neg liver ROS,   Endo/Other  negative endocrine ROS  Renal/GU negative Renal ROS  negative genitourinary   Musculoskeletal   Abdominal   Peds  Hematology negative hematology ROS (+)   Anesthesia Other Findings Past Medical History:   TIA (transient ischemic attack)                              Hypertension                                                History reviewed. No pertinent surgical history.  BMI    Body Mass Index   22.92 kg/m 2      Reproductive/Obstetrics negative OB ROS                             Anesthesia Physical Anesthesia Plan  ASA: III  Anesthesia Plan: General   Post-op Pain Management:    Induction:   Airway Management Planned:   Additional Equipment:   Intra-op Plan:   Post-operative Plan:   Informed Consent: I have reviewed the patients History and Physical, chart, labs and discussed the procedure including the risks, benefits and alternatives for the proposed anesthesia with the patient or authorized representative who has indicated his/her understanding and acceptance.   Dental Advisory Given  Plan Discussed with: Anesthesiologist, CRNA and  Surgeon  Anesthesia Plan Comments:         Anesthesia Quick Evaluation

## 2016-02-09 NOTE — OR Nursing (Signed)
PT.WAS GIVEN INDOMETHIASIN 100 MGS. AT 1615./V.Abrey Bradway,R.N.

## 2016-02-09 NOTE — Transfer of Care (Signed)
Immediate Anesthesia Transfer of Care Note  Patient: Cody Andrade  Procedure(s) Performed: Procedure(s): ENDOSCOPIC RETROGRADE CHOLANGIOPANCREATOGRAPHY (ERCP) (N/A)  Patient Location: PACU and Endoscopy Unit  Anesthesia Type:General  Level of Consciousness: awake, alert  and oriented  Airway & Oxygen Therapy: Patient Spontanous Breathing and Patient connected to nasal cannula oxygen  Post-op Assessment: Report given to RN and Post -op Vital signs reviewed and stable  Post vital signs: Reviewed and stable  Last Vitals:  Filed Vitals:   02/09/16 1436 02/09/16 1624  BP: 128/51 119/64  Pulse:  91  Temp: 36.2 C 36.1 C  Resp: 18 12    Complications: No apparent anesthesia complications

## 2016-02-10 ENCOUNTER — Encounter: Payer: Self-pay | Admitting: Hematology and Oncology

## 2016-02-10 DIAGNOSIS — K838 Other specified diseases of biliary tract: Secondary | ICD-10-CM

## 2016-02-10 DIAGNOSIS — Z87891 Personal history of nicotine dependence: Secondary | ICD-10-CM

## 2016-02-10 DIAGNOSIS — R5383 Other fatigue: Secondary | ICD-10-CM

## 2016-02-10 DIAGNOSIS — Z8673 Personal history of transient ischemic attack (TIA), and cerebral infarction without residual deficits: Secondary | ICD-10-CM

## 2016-02-10 DIAGNOSIS — R109 Unspecified abdominal pain: Secondary | ICD-10-CM

## 2016-02-10 DIAGNOSIS — I959 Hypotension, unspecified: Secondary | ICD-10-CM

## 2016-02-10 DIAGNOSIS — R634 Abnormal weight loss: Secondary | ICD-10-CM

## 2016-02-10 DIAGNOSIS — R630 Anorexia: Secondary | ICD-10-CM

## 2016-02-10 DIAGNOSIS — R17 Unspecified jaundice: Secondary | ICD-10-CM

## 2016-02-10 DIAGNOSIS — I10 Essential (primary) hypertension: Secondary | ICD-10-CM

## 2016-02-10 LAB — COMPREHENSIVE METABOLIC PANEL
ALBUMIN: 2.9 g/dL — AB (ref 3.5–5.0)
ALK PHOS: 253 U/L — AB (ref 38–126)
ALT: 303 U/L — ABNORMAL HIGH (ref 17–63)
AST: 117 U/L — AB (ref 15–41)
Anion gap: 5 (ref 5–15)
BILIRUBIN TOTAL: 5.3 mg/dL — AB (ref 0.3–1.2)
BUN: 44 mg/dL — AB (ref 6–20)
CALCIUM: 8.2 mg/dL — AB (ref 8.9–10.3)
CO2: 25 mmol/L (ref 22–32)
Chloride: 110 mmol/L (ref 101–111)
Creatinine, Ser: 1.41 mg/dL — ABNORMAL HIGH (ref 0.61–1.24)
GFR calc Af Amer: 53 mL/min — ABNORMAL LOW (ref >60)
GFR calc non Af Amer: 45 mL/min — ABNORMAL LOW (ref >60)
GLUCOSE: 85 mg/dL (ref 65–99)
Potassium: 3.7 mmol/L (ref 3.5–5.1)
Sodium: 140 mmol/L (ref 135–145)
TOTAL PROTEIN: 5.2 g/dL — AB (ref 6.5–8.1)

## 2016-02-10 MED ORDER — OXYCODONE HCL 5 MG PO TABS: 5.0000 mg | ORAL_TABLET | ORAL | Status: DC | PRN

## 2016-02-10 MED ORDER — ZOLPIDEM TARTRATE 5 MG PO TABS: 5.0000 mg | ORAL_TABLET | Freq: Every evening | ORAL | Status: DC | PRN

## 2016-02-10 MED ORDER — SIMETHICONE 80 MG PO CHEW
80.0000 mg | CHEWABLE_TABLET | Freq: Four times a day (QID) | ORAL | Status: DC | PRN
  Administered 2016-02-10 – 2016-02-11 (×2): 80 mg via ORAL
  Filled 2016-02-10 (×4): qty 1

## 2016-02-10 MED ORDER — SALINE SPRAY 0.65 % NA SOLN
1.0000 | NASAL | Status: DC | PRN
  Administered 2016-02-10: 22:00:00 1 via NASAL
  Filled 2016-02-10: qty 44

## 2016-02-10 MED ORDER — ENOXAPARIN SODIUM 40 MG/0.4ML ~~LOC~~ SOLN
40.0000 mg | SUBCUTANEOUS | Status: DC
  Administered 2016-02-10: 16:00:00 40 mg via SUBCUTANEOUS
  Filled 2016-02-10: qty 0.4

## 2016-02-10 NOTE — Progress Notes (Signed)
[facility]  Date of admission:  02/08/2016  Inpatient day:  02/10/2016  Consulting physician:  Dr. Fritzi Mandes  Reason for Consultation:  Pancreatic cancer  Chief Complaint: Cody Andrade is a 82 y.o. male who was admitted with jaundice and hypotension.  HPI:  The patient notes at least a 4 week history of fatigue and not feeling well.  He notes decreased appetite and weight loss of 20 pounds.  He describes being queezy in conjunction with vague abdominal pain.  He was prescribed antibiotics for possible diverticulitis approximately 3 weeks ago.  He states that after a 10 day course of antibiotics, he felt worse.  He was then referred to gastroenterology.  He was noted to be hypotensive and jaundiced and thus referred to the emergency room.    Evaluation in ER revealed a bilirubin of 9.3, AST 210, ALT 466, alkaline phosphatase 204, and bilirubin 9.3.  RUQ ultrasound revealed biliary ductal dilatation as well as changes suggestive of hepatic  metastatic disease. This may be related to an underlying pancreatic lesion. CT of the abdomen and pelvis with contrast (both oral and IV) was recommended.  He underwent ERCP on 02/09/2016 by Dr. Lucilla Lame.  Findings revealed segmental biliary stricture.  The upper third of the main bile duct was dilated.  A biliary sphincterotomy was performed.  A metal stent was placed into the common bile duct.  He tolerated the procedure well.  Bilirubin is 5.3 today.  Past Medical History  Diagnosis Date  . TIA (transient ischemic attack)   . Hypertension   . Diverticulitis     Past Surgical History  Procedure Laterality Date  . Hernia repair      History reviewed. No pertinent family history.  Social History:  reports that he has quit smoking. He does not have any smokeless tobacco history on file. He reports that he drinks alcohol. His drug history is not on file. The patient lives alone.  He is originally from [place].  He is  accompanied by his 2 daughters today.  Allergies:  Allergies  Allergen Reactions  . Iodine Nausea Only  . Lodine [Etodolac] Nausea And Vomiting    Medications Prior to Admission  Medication Sig Dispense Refill  . dipyridamole-aspirin (AGGRENOX) 200-25 MG 12hr capsule Take 1 capsule by mouth 2 (two) times daily.    Marland Kitchen lisinopril-hydrochlorothiazide (PRINZIDE,ZESTORETIC) 20-25 MG tablet Take 1 tablet by mouth daily.    Marland Kitchen omeprazole (PRILOSEC) 20 MG capsule Take 1 capsule by mouth daily.    . ondansetron (ZOFRAN-ODT) 4 MG disintegrating tablet Take 1 tablet by mouth every 6 (six) hours as needed.  0  . traZODone (DESYREL) 50 MG tablet Take 50 mg by mouth at bedtime.  0  . ciprofloxacin (CIPRO) 500 MG tablet Take 1 tablet by mouth 2 (two) times daily. Reported on 02/08/2016  0    Review of Systems: GENERAL:  Fatigue.  No fevers or sweats.  Weight loss of 20 pounds. PERFORMANCE STATUS (ECOG):  1-2 HEENT:  No visual changes, runny nose, sore throat, mouth sores or tenderness. Lungs: No shortness of breath or cough.  No hemoptysis. Cardiac:  No chest pain, palpitations, orthopnea, or PND. GI:  Abdominal pain.  Nausea.  Poor appetite.  No vomiting, diarrhea, constipation, melena or hematochezia. GU:  No urgency, frequency, dysuria, or hematuria. Musculoskeletal:  Back pain.  No joint pain.  No muscle tenderness. Extremities:  No pain or swelling. Skin:  No rashes or skin changes. Neuro:  General  weakness.  No headache, numbness or weakness, balance or coordination issues. Endocrine:  No diabetes, thyroid issues, hot flashes or night sweats. Psych:  No mood changes, depression or anxiety. Pain:  No focal pain. Review of systems:  All other systems reviewed and found to be negative.  Physical Exam:  Blood pressure 127/47, pulse 79, temperature 98.4 F (36.9 C), temperature source Oral, resp. rate 20, height 5' 11"  (1.803 m), weight 164 lb (74.39 kg), SpO2 100 %.  GENERAL:  Well developed,  well nourished, sitting comfortably on the medical unit in no acute distress. MENTAL STATUS:  Alert and oriented to person, place and time. HEAD:  Pearline Cables hair.  Normocephalic, atraumatic, face symmetric, no Cushingoid features. EYES:  Hazel eyes.  Scleral icterus.  Pupils equal round and reactive to light and accomodation.  No conjunctivitis. ENT:  Oropharynx clear without lesion.  Tongue normal. Mucous membranes moist.  RESPIRATORY:  Clear to auscultation without rales, wheezes or rhonchi. CARDIOVASCULAR:  Regular rate and rhythm without murmur, rub or gallop. ABDOMEN:  Soft, slightly ender in the epigastric region.  No guarding or rebound tenderness.  Active bowel sounds and no hepatosplenomegaly.  No masses. SKIN:  No rashes, ulcers or lesions. EXTREMITIES: No edema, no skin discoloration or tenderness.  No palpable cords. LYMPH NODES: No palpable cervical, supraclavicular, axillary or inguinal adenopathy  NEUROLOGICAL: Unremarkable. PSYCH:  Appropriate.  Results for orders placed or performed during the hospital encounter of 02/08/16 (from the past 48 hour(s))  Lipase, blood     Status: Abnormal   Collection Time: 02/08/16  3:58 PM  Result Value Ref Range   Lipase 66 (H) 11 - 51 U/L  Comprehensive metabolic panel     Status: Abnormal   Collection Time: 02/08/16  3:58 PM  Result Value Ref Range   Sodium 138 135 - 145 mmol/L   Potassium 4.2 3.5 - 5.1 mmol/L   Chloride 103 101 - 111 mmol/L   CO2 24 22 - 32 mmol/L   Glucose, Bld 117 (H) 65 - 99 mg/dL   BUN 62 (H) 6 - 20 mg/dL   Creatinine, Ser 2.04 (H) 0.61 - 1.24 mg/dL   Calcium 9.5 8.9 - 10.3 mg/dL   Total Protein 7.5 6.5 - 8.1 g/dL   Albumin 4.1 3.5 - 5.0 g/dL   AST 210 (H) 15 - 41 U/L   ALT 466 (H) 17 - 63 U/L   Alkaline Phosphatase 204 (H) 38 - 126 U/L   Total Bilirubin 9.3 (H) 0.3 - 1.2 mg/dL   GFR calc non Af Amer 29 (L) >60 mL/min   GFR calc Af Amer 34 (L) >60 mL/min    Comment: (NOTE) The eGFR has been calculated using  the CKD EPI equation. This calculation has not been validated in all clinical situations. eGFR's persistently <60 mL/min signify possible Chronic Kidney Disease.    Anion gap 11 5 - 15  CBC     Status: Abnormal   Collection Time: 02/08/16  3:58 PM  Result Value Ref Range   WBC 8.5 3.8 - 10.6 K/uL   RBC 4.18 (L) 4.40 - 5.90 MIL/uL   Hemoglobin 13.2 13.0 - 18.0 g/dL   HCT 39.0 (L) 40.0 - 52.0 %   MCV 93.4 80.0 - 100.0 fL   MCH 31.5 26.0 - 34.0 pg   MCHC 33.7 32.0 - 36.0 g/dL   RDW 13.8 11.5 - 14.5 %   Platelets 157 150 - 440 K/uL    Comment: PLATELET COUNT CONFIRMED BY  SMEAR  Urinalysis complete, with microscopic ([facility] only)     Status: Abnormal   Collection Time: 02/08/16  3:58 PM  Result Value Ref Range   Color, Urine AMBER (A) YELLOW   APPearance HAZY (A) CLEAR   Glucose, UA NEGATIVE NEGATIVE mg/dL   Bilirubin Urine 1+ (A) NEGATIVE   Ketones, ur NEGATIVE NEGATIVE mg/dL   Specific Gravity, Urine 1.013 1.005 - 1.030   Hgb urine dipstick NEGATIVE NEGATIVE   pH 5.0 5.0 - 8.0   Protein, ur 30 (A) NEGATIVE mg/dL   Nitrite NEGATIVE NEGATIVE   Leukocytes, UA 1+ (A) NEGATIVE   RBC / HPF 0-5 0 - 5 RBC/hpf   WBC, UA TOO NUMEROUS TO COUNT 0 - 5 WBC/hpf   Bacteria, UA RARE (A) NONE SEEN   Squamous Epithelial / LPF NONE SEEN NONE SEEN   Mucous PRESENT   Urine culture     Status: None (Preliminary result)   Collection Time: 02/08/16  3:58 PM  Result Value Ref Range   Specimen Description URINE, CLEAN CATCH    Special Requests Normal    Culture NO GROWTH 1 DAY    Report Status PENDING   Troponin I     Status: None   Collection Time: 02/08/16  3:58 PM  Result Value Ref Range   Troponin I <0.03 <0.031 ng/mL    Comment:        NO INDICATION OF MYOCARDIAL INJURY.   Protime-INR     Status: None   Collection Time: 02/08/16  3:58 PM  Result Value Ref Range   Prothrombin Time 13.9 11.4 - 15.0 seconds   INR 1.05   APTT     Status: None   Collection Time: 02/08/16  3:58 PM  Result  Value Ref Range   aPTT 26 24 - 36 seconds  Basic metabolic panel     Status: Abnormal   Collection Time: 02/09/16  6:24 AM  Result Value Ref Range   Sodium 140 135 - 145 mmol/L   Potassium 3.8 3.5 - 5.1 mmol/L   Chloride 107 101 - 111 mmol/L   CO2 24 22 - 32 mmol/L   Glucose, Bld 96 65 - 99 mg/dL   BUN 59 (H) 6 - 20 mg/dL   Creatinine, Ser 1.72 (H) 0.61 - 1.24 mg/dL   Calcium 8.4 (L) 8.9 - 10.3 mg/dL   GFR calc non Af Amer 36 (L) >60 mL/min   GFR calc Af Amer 41 (L) >60 mL/min    Comment: (NOTE) The eGFR has been calculated using the CKD EPI equation. This calculation has not been validated in all clinical situations. eGFR's persistently <60 mL/min signify possible Chronic Kidney Disease.    Anion gap 9 5 - 15  Comprehensive metabolic panel     Status: Abnormal   Collection Time: 02/10/16  4:28 AM  Result Value Ref Range   Sodium 140 135 - 145 mmol/L   Potassium 3.7 3.5 - 5.1 mmol/L   Chloride 110 101 - 111 mmol/L   CO2 25 22 - 32 mmol/L   Glucose, Bld 85 65 - 99 mg/dL   BUN 44 (H) 6 - 20 mg/dL   Creatinine, Ser 1.41 (H) 0.61 - 1.24 mg/dL   Calcium 8.2 (L) 8.9 - 10.3 mg/dL   Total Protein 5.2 (L) 6.5 - 8.1 g/dL   Albumin 2.9 (L) 3.5 - 5.0 g/dL   AST 117 (H) 15 - 41 U/L   ALT 303 (H) 17 - 63 U/L   Alkaline  Phosphatase 253 (H) 38 - 126 U/L   Total Bilirubin 5.3 (H) 0.3 - 1.2 mg/dL   GFR calc non Af Amer 45 (L) >60 mL/min   GFR calc Af Amer 53 (L) >60 mL/min    Comment: (NOTE) The eGFR has been calculated using the CKD EPI equation. This calculation has not been validated in all clinical situations. eGFR's persistently <60 mL/min signify possible Chronic Kidney Disease.    Anion gap 5 5 - 15   US Abdomen Limited Ruq  02/08/2016  CLINICAL DATA:  Abdominal pain and elevated LFTs for several weeks EXAM: US ABDOMEN LIMITED - RIGHT UPPER QUADRANT COMPARISON:  None. FINDINGS: Gallbladder: Well distended with echogenic material floating within the bile consistent with  gallbladder sludge and likely small stones. No wall thickening or pericholecystic fluid is noted. Common bile duct: Diameter: Dilated to 11.7 mm. Liver: Multiple hypoechoic lesions are identified within the liver. These are suspicious for underlying metastatic disease. IMPRESSION: Biliary ductal dilatation as well as changes suggestive of hepatic metastatic disease. This may be related to an underlying pancreatic lesion. CT of the abdomen and pelvis with contrast (both oral and IV) is recommended. Electronically Signed   By: Inez Catalina M.D.   On: 02/08/2016 20:12    Assessment:  The patient is a 82 y.o. gentleman with possible metastatic pancreatic cancer or cholangiocarcinoma.  He presented with fatigue, 20 pound weight loss, abdominal discomfort and jaundice.  RUQ ultrasound revealed biliary ductal dilatation as well as changes suggestive of hepatic  metastatic disease.  ERCP on 02/09/2016 revealed segmental biliary stricture.  The upper third of the main bile duct was dilated.  A biliary sphincterotomy was performed and a metal stent placed.  Plan:   1.  Review findings of imaging and ERCP.  Discuss concern for metastatic pancreatic cancer.  CA19-9 drawn (results pending).  Discuss plan for outpatient imaging with PET scan and biopsy.  Discuss palliative chemotherapy.  Discuss port-a-cath placement.  Numerous questions asked and answered. 2.  Follow-up CA19-9. 3.  Schedule outpatient PET scan followed by biopsy. 4.  Contact patient on 02/12/2016 with appointment schedule.   Thank you for allowing me to participate in Cody Andrade 's care.  I will follow him closely with you while hospitalized and after discharge in the outpatient department.  Lequita Asal, MD  02/10/2016, 2:30 PM

## 2016-02-10 NOTE — Discharge Summary (Addendum)
[place] [facility] [place] NAME: [name]    MR#:  [id number]  DATE OF BIRTH:  08/05/1933  DATE OF ADMISSION:  02/08/2016 ADMITTING PHYSICIAN: Fritzi Mandes, MD  DATE OF DISCHARGE: 02/11/16  PRIMARY CARE PHYSICIAN: No primary care provider on file.    ADMISSION DIAGNOSIS:  Hepatitis [K75.9] UTI (lower urinary tract infection) [N39.0] Total bilirubin, elevated [R17] Pancreatic cancer metastasized to liver (HCC) [C25.9, C78.7] Acute renal failure, unspecified acute renal failure type ([place]) [N17.9]  DISCHARGE DIAGNOSIS:  Obstructive juandice s/p ERCP with stent placement due to biliary stricture Metastatic lesions in the liver-w/u as out pt to determine the primary malignancy  HTN H/o TIA SECONDARY DIAGNOSIS:   Past Medical History  Diagnosis Date  . TIA (transient ischemic attack)   . Hypertension     HOSPITAL COURSE:   Cody Andrade is a 82 y.o. male with a known history of hypertension, TIA comes to the emergency room from [facility] clinic after he was found to be jaundiced and had hypotension. Patient has been not feeling well since been in the ER had a 20 pound weight loss along with significant loss of appetite and abdominal pain.  1. Obstructive jaundice with abnormal ultrasound of the abdomen -Patient presented with weakness, malaise, loss of appetite, weight loss of 20 pounds in 2 months -S/p ERCP on 02/09/16 with metal stent placement for biliary stricture. No biospy done per GI -Dr corcoran's input noted. Out pt w/u has been d/w the pt -Ultrasound revealed biliary duct without limitation with lesions in the liver suggestive of possible pancreatic mass -total bilirubin improving  2. Acute renal failure -received IV fluids for hydration.---improved creat down to 1.4 -Clinically improving -Patient has normal baseline creatinine comes in with creatinine of 2.04--1.72--1.4 -d/c IVF  3. Hypertension we'll resume home  meds  4. History of TIA  on Aggrenox   5. DVT prophylaxis Lovenox   D/ctoday  CONSULTS OBTAINED:  Treatment Team:  Lucilla Lame, MD Lequita Asal, MD  DRUG ALLERGIES:   Allergies  Allergen Reactions  . Iodine Nausea Only  . Lodine [Etodolac] Nausea And Vomiting    DISCHARGE MEDICATIONS:   Current Discharge Medication List    START taking these medications   Details  oxyCODONE (OXY IR/ROXICODONE) 5 MG immediate release tablet Take 1 tablet (5 mg total) by mouth every 4 (four) hours as needed for moderate pain. Qty: 30 tablet, Refills: 0    zolpidem (AMBIEN) 5 MG tablet Take 1 tablet (5 mg total) by mouth at bedtime as needed for sleep. Qty: 30 tablet, Refills: 0      CONTINUE these medications which have NOT CHANGED   Details  dipyridamole-aspirin (AGGRENOX) 200-25 MG 12hr capsule Take 1 capsule by mouth 2 (two) times daily.    lisinopril-hydrochlorothiazide (PRINZIDE,ZESTORETIC) 20-25 MG tablet Take 1 tablet by mouth daily.    omeprazole (PRILOSEC) 20 MG capsule Take 1 capsule by mouth daily.    ondansetron (ZOFRAN-ODT) 4 MG disintegrating tablet Take 1 tablet by mouth every 6 (six) hours as needed. Refills: 0    traZODone (DESYREL) 50 MG tablet Take 50 mg by mouth at bedtime. Refills: 0      STOP taking these medications     ciprofloxacin (CIPRO) 500 MG tablet         If you experience worsening of your admission symptoms, develop shortness of breath, life threatening emergency, suicidal or homicidal thoughts you must seek medical attention immediately by calling 911  or calling your MD immediately  if symptoms less severe.  You Must read complete instructions/literature along with all the possible adverse reactions/side effects for all the Medicines you take and that have been prescribed to you. Take any new Medicines after you have completely understood and accept all the possible adverse reactions/side effects.   Please note  You were cared for by  a hospitalist during your hospital stay. If you have any questions about your discharge medications or the care you received while you were in the hospital after you are discharged, you can call the unit and asked to speak with the hospitalist on call if the hospitalist that took care of you is not available. Once you are discharged, your primary care physician will handle any further medical issues. Please note that NO REFILLS for any discharge medications will be authorized once you are discharged, as it is imperative that you return to your primary care physician (or establish a relationship with a primary care physician if you do not have one) for your aftercare needs so that they can reassess your need for medications and monitor your lab values. Today   SUBJECTIVE   Doing ok VITAL SIGNS:  Blood pressure 139/48, pulse 74, temperature 98 F (36.7 C), temperature source Oral, resp. rate 18, height 5\' 11"  (1.803 m), weight 74.39 kg (164 lb), SpO2 99 %.  I/O:   Intake/Output Summary (Last 24 hours) at 02/10/16 1133 Last data filed at 02/10/16 [id number]  Gross per 24 hour  Intake   2172 ml  Output      4 ml  Net   2168 ml    PHYSICAL EXAMINATION:  GENERAL:  82 y.o.-year-old patient lying in the bed with no acute distress.  EYES: Pupils equal, round, reactive to light and accommodation. ++scleral icterus. Extraocular muscles intact.  HEENT: Head atraumatic, normocephalic. Oropharynx and nasopharynx clear.  NECK:  Supple, no jugular venous distention. No thyroid enlargement, no tenderness.  LUNGS: Normal breath sounds bilaterally, no wheezing, rales,rhonchi or crepitation. No use of accessory muscles of respiration.  CARDIOVASCULAR: S1, S2 normal. No murmurs, rubs, or gallops.  ABDOMEN: Soft, non-tender, non-distended. Bowel sounds present. No organomegaly or mass.  EXTREMITIES: No pedal edema, cyanosis, or clubbing.  NEUROLOGIC: Cranial nerves II through XII are intact. Muscle strength 5/5 in  all extremities. Sensation intact. Gait not checked.  PSYCHIATRIC: The patient is alert and oriented x 3.  SKIN: No obvious rash, lesion, or ulcer.   DATA REVIEW:   CBC   Recent Labs Lab 02/08/16 1558  WBC 8.5  HGB 13.2  HCT 39.0*  PLT 157    Chemistries   Recent Labs Lab 02/10/16 0428  NA 140  K 3.7  CL 110  CO2 25  GLUCOSE 85  BUN 44*  CREATININE 1.41*  CALCIUM 8.2*  AST 117*  ALT 303*  ALKPHOS 253*  BILITOT 5.3*    Microbiology Results   Recent Results (from the past 240 hour(s))  Urine culture     Status: None (Preliminary result)   Collection Time: 02/08/16  3:58 PM  Result Value Ref Range Status   Specimen Description URINE, CLEAN CATCH  Final   Special Requests Normal  Final   Culture NO GROWTH 1 DAY  Final   Report Status PENDING  Incomplete    RADIOLOGY:  US Abdomen Limited Ruq  02/08/2016  CLINICAL DATA:  Abdominal pain and elevated LFTs for several weeks EXAM: US ABDOMEN LIMITED - RIGHT UPPER QUADRANT COMPARISON:  None.  FINDINGS: Gallbladder: Well distended with echogenic material floating within the bile consistent with gallbladder sludge and likely small stones. No wall thickening or pericholecystic fluid is noted. Common bile duct: Diameter: Dilated to 11.7 mm. Liver: Multiple hypoechoic lesions are identified within the liver. These are suspicious for underlying metastatic disease. IMPRESSION: Biliary ductal dilatation as well as changes suggestive of hepatic metastatic disease. This may be related to an underlying pancreatic lesion. CT of the abdomen and pelvis with contrast (both oral and IV) is recommended. Electronically Signed   By: Inez Catalina M.D.   On: 02/08/2016 20:12     Management plans discussed with the patient, family and they are in agreement.  CODE STATUS:     Code Status Orders        Start     Ordered   02/09/16 0033  Full code   Continuous     02/09/16 0032    Code Status History    Date Active Date Inactive Code  Status Order ID Comments User Context   This patient has a current code status but no historical code status.      TOTAL TIME TAKING CARE OF THIS PATIENT: 40 minutes.    Stamatia Masri M.D on 02/11/2016  Between 7am to 6pm - Pager - [phone] After 6pm go to [email or web address] - password [email or web address] [facility]  [facility]  Office  [phone]  CC: Primary care physician; No primary care provider on file.

## 2016-02-11 LAB — URINE CULTURE
Culture: NO GROWTH
Special Requests: NORMAL

## 2016-02-11 NOTE — Progress Notes (Signed)
Patient ID: Cody Andrade, male   DOB: 29-Aug-1933, 82 y.o.   MRN: [id number] [place] [facility] [place] NAME: Cody Andrade    MR#:  [id number]  DATE OF BIRTH:  Apr 16, 1933  SUBJECTIVE:  Some gas pains. Appetite little better  REVIEW OF SYSTEMS:   Review of Systems  Constitutional: Positive for malaise/fatigue. Negative for fever, chills and weight loss.  HENT: Negative for ear discharge, ear pain and nosebleeds.   Eyes: Negative for blurred vision, pain and discharge.  Respiratory: Negative for sputum production, shortness of breath, wheezing and stridor.   Cardiovascular: Negative for chest pain, palpitations, orthopnea and PND.  Gastrointestinal: Positive for vomiting and abdominal pain. Negative for nausea and diarrhea.  Genitourinary: Negative for urgency and frequency.  Musculoskeletal: Negative for back pain and joint pain.  Neurological: Positive for weakness. Negative for sensory change, speech change and focal weakness.  Psychiatric/Behavioral: Negative for depression and hallucinations. The patient is not nervous/anxious.   All other systems reviewed and are negative.  Tolerating Diet:some Tolerating PT: not needed  DRUG ALLERGIES:   Allergies  Allergen Reactions  . Iodine Nausea Only  . Lodine [Etodolac] Nausea And Vomiting    VITALS:  Blood pressure 135/66, pulse 87, temperature 98.9 F (37.2 C), temperature source Oral, resp. rate 18, height 5\' 11"  (1.803 m), weight 74.39 kg (164 lb), SpO2 98 %.  PHYSICAL EXAMINATION:   Physical Exam  GENERAL:  82 y.o.-year-old patient lying in the bed with no acute distress.  EYES: Pupils equal, round, reactive to light and accommodation. ++ scleral icterus. Extraocular muscles intact.  HEENT: Head atraumatic, normocephalic. Oropharynx and nasopharynx clear.  NECK:  Supple, no jugular venous distention. No thyroid enlargement, no tenderness.  LUNGS: Normal breath sounds  bilaterally, no wheezing, rales, rhonchi. No use of accessory muscles of respiration.  CARDIOVASCULAR: S1, S2 normal. No murmurs, rubs, or gallops.  ABDOMEN: Soft, nontender, nondistended. Bowel sounds present. No organomegaly or mass.  EXTREMITIES: No cyanosis, clubbing or edema b/l.    NEUROLOGIC: Cranial nerves II through XII are intact. No focal Motor or sensory deficits b/l.   PSYCHIATRIC:  patient is alert and oriented x 3.  SKIN: No obvious rash, lesion, or ulcer.   LABORATORY PANEL:  CBC  Recent Labs Lab 02/08/16 1558  WBC 8.5  HGB 13.2  HCT 39.0*  PLT 157    Chemistries   Recent Labs Lab 02/10/16 0428  NA 140  K 3.7  CL 110  CO2 25  GLUCOSE 85  BUN 44*  CREATININE 1.41*  CALCIUM 8.2*  AST 117*  ALT 303*  ALKPHOS 253*  BILITOT 5.3*   Cardiac Enzymes  Recent Labs Lab 02/08/16 1558  TROPONINI <0.03   RADIOLOGY:  No results found. ASSESSMENT AND PLAN:  Cody Andrade is a 82 y.o. male with a known history of hypertension, TIA comes to the emergency room from [facility] clinic after he was found to be jaundiced and had hypotension. Patient has been not feeling well since been in the ER had a 20 pound weight loss along with significant loss of appetite and abdominal pain.  1. Obstructive jaundice with abnormal ultrasound of the abdomen -Patient presented with weakness, malaise, loss of appetite, weight loss of 20 pounds in 2 months -S/p ERCP on 02/09/16 with metal stent placement for biliary stricture. No biospy done per GI -Dr corcoran's consulted -Ultrasound revealed biliary duct without limitation with lesions in the liver suggestive of possible pancreatic mass -  total bilirubin improving  2. Acute renal failure -received IV fluids for hydration.---improved creat down to 1.4 -Clinically improving -Patient has normal baseline creatinine comes in with creatinine of 2.04--1.72--1.4 -d/c IVF  3. Hypertension we'll resume home meds  4. History of TIA on  Aggrenox  Case discussed with Care Management/Social Worker. Management plans discussed with the patient, family and they are in agreement.  CODE STATUS: full  DVT Prophylaxis: lovenox  TOTAL TIME TAKING CARE OF THIS PATIENT: 35 minutes.  >50% time spent on counselling and coordination of care  POSSIBLE D/C IN1-2DAYS, DEPENDING ON CLINICAL CONDITION.  Note: This dictation was prepared with Dragon dictation along with smaller phrase technology. Any transcriptional errors that result from this process are unintentional.  Jakara Blatter M.D   Between 7am to 6pm - Pager - [phone]  After 6pm go to [email or web address] - password [email or web address] [facility]  [facility]  Office  [phone]  CC: Primary care physician; No primary care provider on file.

## 2016-02-12 ENCOUNTER — Encounter: Payer: Self-pay | Admitting: Gastroenterology

## 2016-02-12 ENCOUNTER — Other Ambulatory Visit: Payer: Self-pay | Admitting: Hematology and Oncology

## 2016-02-12 DIAGNOSIS — D490 Neoplasm of unspecified behavior of digestive system: Secondary | ICD-10-CM

## 2016-02-12 DIAGNOSIS — K831 Obstruction of bile duct: Secondary | ICD-10-CM

## 2016-02-12 DIAGNOSIS — K769 Liver disease, unspecified: Secondary | ICD-10-CM

## 2016-02-14 ENCOUNTER — Emergency Department
Admission: EM | Admit: 2016-02-14 | Discharge: 2016-02-15 | Disposition: A | Discharge status: Home / Self Care | Payer: Medicare Other | Attending: Emergency Medicine | Admitting: Emergency Medicine

## 2016-02-14 ENCOUNTER — Telehealth: Payer: Self-pay | Admitting: Gastroenterology

## 2016-02-14 ENCOUNTER — Emergency Department: Payer: Medicare Other

## 2016-02-14 ENCOUNTER — Encounter: Payer: Self-pay | Admitting: *Deleted

## 2016-02-14 DIAGNOSIS — R509 Fever, unspecified: Secondary | ICD-10-CM | POA: Insufficient documentation

## 2016-02-14 DIAGNOSIS — Z79899 Other long term (current) drug therapy: Secondary | ICD-10-CM | POA: Insufficient documentation

## 2016-02-14 DIAGNOSIS — I10 Essential (primary) hypertension: Secondary | ICD-10-CM | POA: Diagnosis not present

## 2016-02-14 DIAGNOSIS — Z9689 Presence of other specified functional implants: Secondary | ICD-10-CM | POA: Diagnosis not present

## 2016-02-14 DIAGNOSIS — Z87891 Personal history of nicotine dependence: Secondary | ICD-10-CM | POA: Insufficient documentation

## 2016-02-14 DIAGNOSIS — K8689 Other specified diseases of pancreas: Secondary | ICD-10-CM | POA: Diagnosis not present

## 2016-02-14 DIAGNOSIS — Z8719 Personal history of other diseases of the digestive system: Secondary | ICD-10-CM | POA: Diagnosis not present

## 2016-02-14 DIAGNOSIS — K9189 Other postprocedural complications and disorders of digestive system: Secondary | ICD-10-CM | POA: Diagnosis present

## 2016-02-14 DIAGNOSIS — Z7982 Long term (current) use of aspirin: Secondary | ICD-10-CM | POA: Diagnosis not present

## 2016-02-14 HISTORY — DX: Malignant (primary) neoplasm, unspecified: C80.1

## 2016-02-14 LAB — COMPREHENSIVE METABOLIC PANEL
ALBUMIN: 3.3 g/dL — AB (ref 3.5–5.0)
ALK PHOS: 310 U/L — AB (ref 38–126)
ALT: 191 U/L — ABNORMAL HIGH (ref 17–63)
AST: 74 U/L — AB (ref 15–41)
Anion gap: 8 (ref 5–15)
BILIRUBIN TOTAL: 2.4 mg/dL — AB (ref 0.3–1.2)
BUN: 25 mg/dL — AB (ref 6–20)
CALCIUM: 8.6 mg/dL — AB (ref 8.9–10.3)
CO2: 26 mmol/L (ref 22–32)
Chloride: 99 mmol/L — ABNORMAL LOW (ref 101–111)
Creatinine, Ser: 1.23 mg/dL (ref 0.61–1.24)
GFR calc Af Amer: >60 mL/min (ref >60)
GFR, EST NON AFRICAN AMERICAN: 54 mL/min — AB (ref >60)
GLUCOSE: 126 mg/dL — AB (ref 65–99)
Potassium: 4.2 mmol/L (ref 3.5–5.1)
Sodium: 133 mmol/L — ABNORMAL LOW (ref 135–145)
TOTAL PROTEIN: 6.1 g/dL — AB (ref 6.5–8.1)

## 2016-02-14 LAB — URINALYSIS COMPLETE WITH MICROSCOPIC (ARMC ONLY)
BILIRUBIN URINE: NEGATIVE
Bacteria, UA: NONE SEEN
Glucose, UA: NEGATIVE mg/dL
Hgb urine dipstick: NEGATIVE
Leukocytes, UA: NEGATIVE
Nitrite: NEGATIVE
PH: 5 (ref 5.0–8.0)
Protein, ur: 30 mg/dL — AB
Specific Gravity, Urine: 1.026 (ref 1.005–1.030)

## 2016-02-14 LAB — CBC WITH DIFFERENTIAL/PLATELET
BASOS ABS: 0 10*3/uL (ref 0–0.1)
EOS ABS: 0 10*3/uL (ref 0–0.7)
HEMATOCRIT: 33.6 % — AB (ref 40.0–52.0)
Hemoglobin: 11.5 g/dL — ABNORMAL LOW (ref 13.0–18.0)
Lymphocytes Relative: 5 %
Lymphs Abs: 0.4 10*3/uL — ABNORMAL LOW (ref 1.0–3.6)
MCH: 31.4 pg (ref 26.0–34.0)
MCHC: 34.1 g/dL (ref 32.0–36.0)
MCV: 92.2 fL (ref 80.0–100.0)
MONO ABS: 0.7 10*3/uL (ref 0.2–1.0)
Neutro Abs: 6.6 10*3/uL — ABNORMAL HIGH (ref 1.4–6.5)
Neutrophils Relative %: 86 %
PLATELETS: 99 10*3/uL — AB (ref 150–440)
RBC: 3.65 MIL/uL — ABNORMAL LOW (ref 4.40–5.90)
RDW: 13.6 % (ref 11.5–14.5)
WBC: 7.8 10*3/uL (ref 3.8–10.6)

## 2016-02-14 LAB — LIPASE, BLOOD: LIPASE: 30 U/L (ref 11–51)

## 2016-02-14 LAB — RAPID INFLUENZA A&B ANTIGENS (ARMC ONLY)
INFLUENZA A (ARMC): NEGATIVE
INFLUENZA B (ARMC): NEGATIVE

## 2016-02-14 MED ORDER — LEVOFLOXACIN 750 MG PO TABS: 750.0000 mg | ORAL_TABLET | Freq: Every day | ORAL | Status: AC

## 2016-02-14 MED ORDER — IOHEXOL 240 MG/ML SOLN
25.0000 mL | Freq: Once | INTRAMUSCULAR | Status: AC | PRN
  Administered 2016-02-14: 25 mL via ORAL

## 2016-02-14 MED ORDER — IOHEXOL 300 MG/ML  SOLN
100.0000 mL | Freq: Once | INTRAMUSCULAR | Status: AC | PRN
  Administered 2016-02-14: 100 mL via INTRAVENOUS

## 2016-02-14 MED ORDER — DEXTROSE 5 % IV SOLN
1.0000 g | Freq: Once | INTRAVENOUS | Status: AC
  Administered 2016-02-14: 1 g via INTRAVENOUS
  Filled 2016-02-14: qty 10

## 2016-02-14 MED ORDER — SODIUM CHLORIDE 0.9 % IV BOLUS (SEPSIS)
1000.0000 mL | Freq: Once | INTRAVENOUS | Status: AC
  Administered 2016-02-14: 1000 mL via INTRAVENOUS

## 2016-02-14 NOTE — ED Notes (Signed)
Patient transported to CT 

## 2016-02-14 NOTE — ED Provider Notes (Addendum)
[facility] Emergency Department Provider Note  ____________________________________________   I have reviewed the triage vital signs and the nursing notes.   HISTORY  Chief Complaint Post-op Problem    HPI Cody Andrade is a 82 y.o. male was recently discharged from the hospital after admission for possible UTI, acute renal injury, and what turned out to be likely pancreatic cancer with metastases to liver. Patient scheduled for PET scan tomorrow. He has had abdominal pain and discomfort for the last several months. He is here today because he had a fever MAXIMUM TEMPERATURE of 101 last night. Patient did have an ERCP during his recent hospital. He has had no other new or acute symptoms. Specifically he denies cough although the family states he has occasional dry cough. He does have seasonal allergies and some mild rhinorrhea. Over that is not acutely different. He denies any nausea vomiting or diarrhea. He states his abdominal pain is no better or no worse than it normally is. In the epigastric region. he denies feeling numb or weak. He is eating and drinking well and would like to eat right now.  Past Medical History  Diagnosis Date  . TIA (transient ischemic attack)   . Hypertension   . Diverticulitis     Patient Active Problem List   Diagnosis Date Noted  . Protein-calorie malnutrition, severe 02/09/2016  . Neoplasm of digestive system   . Acquired hyperbilirubinemia   . Obstruction of bile duct   . Obstructive jaundice 02/08/2016  . Liver lesion 02/08/2016    Past Surgical History  Procedure Laterality Date  . Hernia repair    . Ercp N/A 02/09/2016    Procedure: ENDOSCOPIC RETROGRADE CHOLANGIOPANCREATOGRAPHY (ERCP);  Surgeon: Lucilla Lame, MD;  Location: [facility] ENDOSCOPY;  Service: Endoscopy;  Laterality: N/A;    Current Outpatient Rx  Name  Route  Sig  Dispense  Refill  . dipyridamole-aspirin (AGGRENOX) 200-25 MG 12hr capsule   Oral   Take 1  capsule by mouth 2 (two) times daily.         Marland Kitchen lisinopril-hydrochlorothiazide (PRINZIDE,ZESTORETIC) 20-25 MG tablet   Oral   Take 1 tablet by mouth daily.         Marland Kitchen omeprazole (PRILOSEC) 20 MG capsule   Oral   Take 1 capsule by mouth daily.         . ondansetron (ZOFRAN-ODT) 4 MG disintegrating tablet   Oral   Take 1 tablet by mouth every 6 (six) hours as needed.      0   . oxyCODONE (OXY IR/ROXICODONE) 5 MG immediate release tablet   Oral   Take 1 tablet (5 mg total) by mouth every 4 (four) hours as needed for moderate pain.   30 tablet   0   . traZODone (DESYREL) 50 MG tablet   Oral   Take 50 mg by mouth at bedtime.      0   . zolpidem (AMBIEN) 5 MG tablet   Oral   Take 1 tablet (5 mg total) by mouth at bedtime as needed for sleep.   30 tablet   0     Allergies Iodine and Lodine  No family history on file.  Social History Social History  Substance Use Topics  . Smoking status: Former Research scientist (life sciences)  . Smokeless tobacco: None  . Alcohol Use: Yes    Review of Systems Constitutional: Positive fever/chills Eyes: No visual changes. ENT: No sore throat. No stiff neck no neck pain Cardiovascular: Denies chest pain. Respiratory:  Denies shortness of breath. Gastrointestinal:   no vomiting.  No diarrhea.  No constipation. Genitourinary: Negative for dysuria. Musculoskeletal: Negative lower extremity swelling Skin: Negative for rash. Neurological: Negative for headaches, focal weakness or numbness. 10-point ROS otherwise negative.  ____________________________________________   PHYSICAL EXAM:  VITAL SIGNS: ED Triage Vitals  Enc Vitals Group     BP 02/14/16 1752 131/69 mmHg     Pulse Rate 02/14/16 1752 90     Resp 02/14/16 1752 20     Temp 02/14/16 1752 98.4 F (36.9 C)     Temp Source 02/14/16 1752 Oral     SpO2 02/14/16 1752 99 %     Weight 02/14/16 1752 165 lb (74.844 kg)     Height 02/14/16 1752 5\' 11"  (1.803 m)     Head Cir --      Peak Flow  --      Pain Score 02/14/16 1754 4     Pain Loc --      Pain Edu? --      Excl. in [place]? --     Constitutional: Alert and oriented. Well appearing and in no acute distress. Eyes: Conjunctivae are normal. PERRL. EOMI. Head: Atraumatic. Nose: No congestion/rhinnorhea. Mouth/Throat: Mucous membranes are moist.  Oropharynx non-erythematous. Neck: No stridor.   Nontender with no meningismus Cardiovascular: Normal rate, regular rhythm. Grossly normal heart sounds.  Good peripheral circulation. Respiratory: Normal respiratory effort.  No retractions. Lungs CTAB. Abdominal: Minimal epigastric tenderness noted. No distention. No guarding no rebound Back:  There is no focal tenderness or step off there is no midline tenderness there are no lesions noted. there is no CVA tenderness Musculoskeletal: No lower extremity tenderness. No joint effusions, no DVT signs strong distal pulses no edema Neurologic:  Normal speech and language. No gross focal neurologic deficits are appreciated.  Skin:  Skin is warm, dry and intact. No rash noted. Psychiatric: Mood and affect are normal. Speech and behavior are normal.  ____________________________________________   LABS (all labs ordered are listed, but only abnormal results are displayed)  Labs Reviewed  COMPREHENSIVE METABOLIC PANEL - Abnormal; Notable for the following:    Sodium 133 (*)    Chloride 99 (*)    Glucose, Bld 126 (*)    BUN 25 (*)    Calcium 8.6 (*)    Total Protein 6.1 (*)    Albumin 3.3 (*)    AST 74 (*)    ALT 191 (*)    Alkaline Phosphatase 310 (*)    Total Bilirubin 2.4 (*)    GFR calc non Af Amer 54 (*)    All other components within normal limits  CBC WITH DIFFERENTIAL/PLATELET - Abnormal; Notable for the following:    RBC 3.65 (*)    Hemoglobin 11.5 (*)    HCT 33.6 (*)    Platelets 99 (*)    Neutro Abs 6.6 (*)    Lymphs Abs 0.4 (*)    All other components within normal limits  RAPID INFLUENZA A&B ANTIGENS ([facility] ONLY)   URINE CULTURE  LIPASE, BLOOD  URINALYSIS COMPLETEWITH MICROSCOPIC ([facility] ONLY)   ____________________________________________  EKG  I personally interpreted any EKGs ordered by me or triage  ____________________________________________  RADIOLOGY  I reviewed any imaging ordered by me or triage that were performed during my shift and, if possible, patient and/or family made aware of any abnormal findings. ____________________________________________   PROCEDURES  Procedure(s) performed: None  Critical Care performed: None  ____________________________________________   INITIAL IMPRESSION / ASSESSMENT  AND PLAN / ED COURSE  Pertinent labs & imaging results that were available during my care of the patient were reviewed by me and considered in my medical decision making (see chart for details).  Patient with a fever, no clear source at this time. Very well-appearing given his baseline. Kidney function is actually improved over last admission bilirubin is improved over last admission sodium is slightly low but not dangerously so, abdomen is nonsurgical. Patient may have a fever completely unrelated to his underlying pathologies. We'll check an influenza as this is peak flu season, we will give him IV fluids, we'll obtain a CT of 7 and pelvis given his recent pathologies and instrumentation, we will check a chest x-ray and a urinalysis and reassess. Patient well-appearing despite his complaints. ----------------------------------------- 11:42 PM on 02/14/2016 -----------------------------------------  Discussed with Dr. Allen Norris, the patient's GI doctor who agrees with plan. We discussed all the findings including the pneumobilia from the recent stent placement. Dr. Allen Norris feels that this is a normal finding. He does not think there is any indication for acute admission. Patient continues to look very well and his only request is that he can eat something. No fever here no elevated white  count and liver functions are turning better etc. I did discuss with Dr. Rogue Bussing, from the patient's oncology group. He was asked me to culture the patient which we have done, given empiric shot of Rocephin and send him home on Levaquin and they will see him tomorrow. As I cover him pending culture which I think is not unreasonable. Otherwise, very well appearing patient who is eager to go home and we will see if we can facilitate that.Patient is very comfortable with the discharge plan. Customary extensive return precautions and follow-up explained to and understood by the patient.  Questions answered. ____________________________________________   FINAL CLINICAL IMPRESSION(S) / ED DIAGNOSES  Final diagnoses:  None      This chart was dictated using voice recognition software.  Despite best efforts to proofread,  errors can occur which can change meaning.     Schuyler Amor, MD 02/14/16 2202  Schuyler Amor, MD 02/15/16 [phone]

## 2016-02-14 NOTE — Telephone Encounter (Signed)
Spoke with [name] (daughter) and she stated that he is not running a fever now, but she is using a head thermometer and she doesn't trust it. She stated he looked a little yellow yesterday but not so much today. Having some abdominal pain but they don't know if it is coming from the stent or the pancreatic mass. The abdominal pain is tolerable at the moment. He has been taking his pain medicine but hasn't had any since 3:00am. Daughter will continue monitoring him today.  Advised her if the abdominal pain worsens, he develops a fever or becomes more jaundice to go to the ER.  Told her Dr. Allen Norris is on call today and will be able to see him.

## 2016-02-14 NOTE — ED Notes (Addendum)
Pt had ERCP 5 days ago by dr Allen Norris.  Pt now has a fever and abd pain.  Pt has nausea.  No v/d/   Pt states he had u/s last week and a mass is on pancreas.  Pt had pet scan scheduled this week.   Pt alert.

## 2016-02-14 NOTE — Discharge Instructions (Signed)
It is not clear exactly what the source of your fever is. If you have increased pain, vomiting, fever that persists and is elevated, weakness, diarrhea, cough, shortness of breath, lethargy, or you feel worse in any way, return to the emergency department. Follow closely with her doctors tomorrow.

## 2016-02-14 NOTE — Telephone Encounter (Signed)
Patient had ERCP WITH SPHINCTEROTOMY,PLACEMENT OF METAL STENT on 02/09/16 with Dr Allen Norris. Patients daughter, Cody Andrade, is calling this morning because he is not feeling well, he looks yellow and is running a fever. She would like to speak with you about this and what she should do. Please call her cell phone at [phone].

## 2016-02-14 NOTE — ED Notes (Addendum)
Pt reports he was seen at a gastroenterologist on Thursday, was sent to ED due to hypotension, In ED pt had US performed which came back with a mass on the pts pancreas and liver. Pt was admitted for mass and jaundice. On Friday, Pt reports he had a ERCP, with stent placed for bile reflow, DC on Sunday from hospital. Pt states he has had abdominal and back pain, along with low grade fevers (100-101) x2 days. Dr. Verl Blalock advised pt to report to ED for evaluation. Pt states he is to have PET scan tomorrow. ED doctor, [place] notified.

## 2016-02-15 ENCOUNTER — Telehealth: Payer: Self-pay | Admitting: *Deleted

## 2016-02-15 ENCOUNTER — Ambulatory Visit
Admission: RE | Admit: 2016-02-15 | Discharge: 2016-02-15 | Disposition: A | Discharge status: Home / Self Care | Payer: Medicare Other | Source: Ambulatory Visit | Attending: Hematology and Oncology | Admitting: Hematology and Oncology

## 2016-02-15 ENCOUNTER — Telehealth: Payer: Self-pay | Admitting: Emergency Medicine

## 2016-02-15 DIAGNOSIS — D490 Neoplasm of unspecified behavior of digestive system: Secondary | ICD-10-CM

## 2016-02-15 DIAGNOSIS — C7951 Secondary malignant neoplasm of bone: Secondary | ICD-10-CM

## 2016-02-15 DIAGNOSIS — R918 Other nonspecific abnormal finding of lung field: Secondary | ICD-10-CM

## 2016-02-15 DIAGNOSIS — K838 Other specified diseases of biliary tract: Secondary | ICD-10-CM | POA: Insufficient documentation

## 2016-02-15 DIAGNOSIS — K8689 Other specified diseases of pancreas: Secondary | ICD-10-CM | POA: Insufficient documentation

## 2016-02-15 DIAGNOSIS — K831 Obstruction of bile duct: Secondary | ICD-10-CM

## 2016-02-15 DIAGNOSIS — K769 Liver disease, unspecified: Secondary | ICD-10-CM

## 2016-02-15 DIAGNOSIS — C787 Secondary malignant neoplasm of liver and intrahepatic bile duct: Secondary | ICD-10-CM | POA: Insufficient documentation

## 2016-02-15 DIAGNOSIS — C259 Malignant neoplasm of pancreas, unspecified: Secondary | ICD-10-CM | POA: Insufficient documentation

## 2016-02-15 LAB — GLUCOSE, CAPILLARY: Glucose-Capillary: 109 mg/dL — ABNORMAL HIGH (ref 65–99)

## 2016-02-15 MED ORDER — FLUDEOXYGLUCOSE F - 18 (FDG) INJECTION
13.7500 | Freq: Once | INTRAVENOUS | Status: AC | PRN
  Administered 2016-02-15: 13.75 via INTRAVENOUS

## 2016-02-15 NOTE — ED Notes (Signed)
Wife called and was unsure if pt was supposed to take the levaquin now or wait for cultures to come back.  i explained that both the instructions and dr note indicate that he is to start the levaquin.  His blood cultures are not resulted at all and his urine has no growth at one day.  She says he came in to get the pet scan done today, but did not see the doctor.  i told her to call them to remind them he was in the ED, and to watch his cultures.  She agrees.

## 2016-02-15 NOTE — Telephone Encounter (Signed)
Person who left message states pt went to ER yest. Had pet scan. Was put on levaquin. Sleeping a lot.  Waiting to hear back from cultures. Phone # [phone]. Called and spoke to pt and he states he is not feeling better than when he went to ER but no worse either.  He states he had pet scan and he understands that he has appt 3/14 but md Corcoran when I spoke to her before calling pt wanted the appt changed to tom at 1:30. And pt agreeable to the change of appt.  I asked if he was having a fever and he said not that he knows of but his daughter is coming to check temp but not got here yet. I gave him directions to cancer center in [place].

## 2016-02-16 ENCOUNTER — Other Ambulatory Visit: Payer: Self-pay | Admitting: Hematology and Oncology

## 2016-02-16 ENCOUNTER — Other Ambulatory Visit: Payer: Self-pay

## 2016-02-16 ENCOUNTER — Inpatient Hospital Stay: Payer: Medicare Other | Attending: Hematology and Oncology | Admitting: Hematology and Oncology

## 2016-02-16 VITALS — BP 109/67 | HR 109 | Temp 98.8°F | Resp 18 | Ht 71.0 in | Wt 170.0 lb

## 2016-02-16 DIAGNOSIS — K869 Disease of pancreas, unspecified: Secondary | ICD-10-CM

## 2016-02-16 DIAGNOSIS — Z808 Family history of malignant neoplasm of other organs or systems: Secondary | ICD-10-CM | POA: Diagnosis not present

## 2016-02-16 DIAGNOSIS — Z79899 Other long term (current) drug therapy: Secondary | ICD-10-CM

## 2016-02-16 DIAGNOSIS — R918 Other nonspecific abnormal finding of lung field: Secondary | ICD-10-CM | POA: Diagnosis not present

## 2016-02-16 DIAGNOSIS — Z8673 Personal history of transient ischemic attack (TIA), and cerebral infarction without residual deficits: Secondary | ICD-10-CM | POA: Insufficient documentation

## 2016-02-16 DIAGNOSIS — R110 Nausea: Secondary | ICD-10-CM

## 2016-02-16 DIAGNOSIS — C779 Secondary and unspecified malignant neoplasm of lymph node, unspecified: Secondary | ICD-10-CM | POA: Insufficient documentation

## 2016-02-16 DIAGNOSIS — K8689 Other specified diseases of pancreas: Secondary | ICD-10-CM

## 2016-02-16 DIAGNOSIS — R978 Other abnormal tumor markers: Secondary | ICD-10-CM | POA: Diagnosis not present

## 2016-02-16 DIAGNOSIS — C787 Secondary malignant neoplasm of liver and intrahepatic bile duct: Secondary | ICD-10-CM | POA: Diagnosis not present

## 2016-02-16 DIAGNOSIS — K831 Obstruction of bile duct: Secondary | ICD-10-CM | POA: Diagnosis not present

## 2016-02-16 DIAGNOSIS — Z87891 Personal history of nicotine dependence: Secondary | ICD-10-CM | POA: Diagnosis not present

## 2016-02-16 DIAGNOSIS — K7689 Other specified diseases of liver: Secondary | ICD-10-CM

## 2016-02-16 DIAGNOSIS — R634 Abnormal weight loss: Secondary | ICD-10-CM

## 2016-02-16 DIAGNOSIS — E43 Unspecified severe protein-calorie malnutrition: Secondary | ICD-10-CM

## 2016-02-16 DIAGNOSIS — D696 Thrombocytopenia, unspecified: Secondary | ICD-10-CM | POA: Insufficient documentation

## 2016-02-16 DIAGNOSIS — K219 Gastro-esophageal reflux disease without esophagitis: Secondary | ICD-10-CM | POA: Insufficient documentation

## 2016-02-16 DIAGNOSIS — C259 Malignant neoplasm of pancreas, unspecified: Secondary | ICD-10-CM | POA: Insufficient documentation

## 2016-02-16 DIAGNOSIS — Z5111 Encounter for antineoplastic chemotherapy: Secondary | ICD-10-CM | POA: Diagnosis not present

## 2016-02-16 DIAGNOSIS — R5383 Other fatigue: Secondary | ICD-10-CM | POA: Insufficient documentation

## 2016-02-16 DIAGNOSIS — Z801 Family history of malignant neoplasm of trachea, bronchus and lung: Secondary | ICD-10-CM | POA: Insufficient documentation

## 2016-02-16 DIAGNOSIS — K769 Liver disease, unspecified: Secondary | ICD-10-CM

## 2016-02-16 DIAGNOSIS — I10 Essential (primary) hypertension: Secondary | ICD-10-CM | POA: Diagnosis not present

## 2016-02-16 DIAGNOSIS — C250 Malignant neoplasm of head of pancreas: Secondary | ICD-10-CM

## 2016-02-16 LAB — URINE CULTURE: CULTURE: NO GROWTH

## 2016-02-16 MED ORDER — ONDANSETRON 4 MG PO TBDP: 4.0000 mg | ORAL_TABLET | Freq: Four times a day (QID) | ORAL | Status: DC | PRN

## 2016-02-16 NOTE — Patient Instructions (Signed)
Nanoparticle Albumin-Bound Paclitaxel injection  What is this medicine?  NANOPARTICLE ALBUMIN-BOUND PACLITAXEL (Na no PAHR ti kuhl al BYOO muhn-bound PAK li TAX el) is a chemotherapy drug. It targets fast dividing cells, like cancer cells, and causes these cells to die. This medicine is used to treat advanced breast cancer and advanced lung cancer.  This medicine may be used for other purposes; ask your health care provider or pharmacist if you have questions.  What should I tell my health care provider before I take this medicine?  They need to know if you have any of these conditions:  -kidney disease  -liver disease  -low blood counts, like low platelets, red blood cells, or white blood cells  -recent or ongoing radiation therapy  -an unusual or allergic reaction to paclitaxel, albumin, other chemotherapy, other medicines, foods, dyes, or preservatives  -pregnant or trying to get pregnant  -breast-feeding  How should I use this medicine?  This drug is given as an infusion into a vein. It is administered in a hospital or clinic by a specially trained health care professional.  Talk to your pediatrician regarding the use of this medicine in children. Special care may be needed.  Overdosage: If you think you have taken too much of this medicine contact a poison control center or emergency room at once.  NOTE: This medicine is only for you. Do not share this medicine with others.  What if I miss a dose?  It is important not to miss your dose. Call your doctor or health care professional if you are unable to keep an appointment.  What may interact with this medicine?  -cyclosporine  -diazepam  -ketoconazole  -medicines to increase blood counts like filgrastim, pegfilgrastim, sargramostim  -other chemotherapy drugs like cisplatin, doxorubicin, epirubicin, etoposide, teniposide, vincristine  -quinidine  -testosterone  -vaccines  -verapamil  Talk to your doctor or health care professional before taking any of these  medicines:  -acetaminophen  -aspirin  -ibuprofen  -ketoprofen  -naproxen  This list may not describe all possible interactions. Give your health care provider a list of all the medicines, herbs, non-prescription drugs, or dietary supplements you use. Also tell them if you smoke, drink alcohol, or use illegal drugs. Some items may interact with your medicine.  What should I watch for while using this medicine?  Your condition will be monitored carefully while you are receiving this medicine. You will need important blood work done while you are taking this medicine.  This drug may make you feel generally unwell. This is not uncommon, as chemotherapy can affect healthy cells as well as cancer cells. Report any side effects. Continue your course of treatment even though you feel ill unless your doctor tells you to stop.  In some cases, you may be given additional medicines to help with side effects. Follow all directions for their use.  Call your doctor or health care professional for advice if you get a fever, chills or sore throat, or other symptoms of a cold or flu. Do not treat yourself. This drug decreases your body's ability to fight infections. Try to avoid being around people who are sick.  This medicine may increase your risk to bruise or bleed. Call your doctor or health care professional if you notice any unusual bleeding.  Be careful brushing and flossing your teeth or using a toothpick because you may get an infection or bleed more easily. If you have any dental work done, tell your dentist you are receiving this   medicine.  Avoid taking products that contain aspirin, acetaminophen, ibuprofen, naproxen, or ketoprofen unless instructed by your doctor. These medicines may hide a fever.  Do not become pregnant while taking this medicine. Women should inform their doctor if they wish to become pregnant or think they might be pregnant. There is a potential for serious side effects to an unborn child. Talk to  your health care professional or pharmacist for more information. Do not breast-feed an infant while taking this medicine.  Men are advised not to father a child while receiving this medicine.  What side effects may I notice from receiving this medicine?  Side effects that you should report to your doctor or health care professional as soon as possible:  -allergic reactions like skin rash, itching or hives, swelling of the face, lips, or tongue  -low blood counts - This drug may decrease the number of white blood cells, red blood cells and platelets. You may be at increased risk for infections and bleeding.  -signs of infection - fever or chills, cough, sore throat, pain or difficulty passing urine  -signs of decreased platelets or bleeding - bruising, pinpoint red spots on the skin, black, tarry stools, nosebleeds  -signs of decreased red blood cells - unusually weak or tired, fainting spells, lightheadedness  -breathing problems  -changes in vision  -chest pain  -high or low blood pressure  -mouth sores  -nausea and vomiting  -pain, swelling, redness or irritation at the injection site  -pain, tingling, numbness in the hands or feet  -slow or irregular heartbeat  -swelling of the ankle, feet, hands  Side effects that usually do not require medical attention (report to your doctor or health care professional if they continue or are bothersome):  -aches, pains  -changes in the color of fingernails  -diarrhea  -hair loss  -loss of appetite  This list may not describe all possible side effects. Call your doctor for medical advice about side effects. You may report side effects to FDA at 1-800-FDA-1088.  Where should I keep my medicine?  This drug is given in a hospital or clinic and will not be stored at home.  NOTE: This sheet is a summary. It may not cover all possible information. If you have questions about this medicine, talk to your doctor, pharmacist, or health care provider.      2016, Elsevier/Gold Standard.  (2013-01-18 16:48:50)  Gemcitabine injection  What is this medicine?  GEMCITABINE (jem SIT a been) is a chemotherapy drug. This medicine is used to treat many types of cancer like breast cancer, lung cancer, pancreatic cancer, and ovarian cancer.  This medicine may be used for other purposes; ask your health care provider or pharmacist if you have questions.  What should I tell my health care provider before I take this medicine?  They need to know if you have any of these conditions:  -blood disorders  -infection  -kidney disease  -liver disease  -recent or ongoing radiation therapy  -an unusual or allergic reaction to gemcitabine, other chemotherapy, other medicines, foods, dyes, or preservatives  -pregnant or trying to get pregnant  -breast-feeding  How should I use this medicine?  This drug is given as an infusion into a vein. It is administered in a hospital or clinic by a specially trained health care professional.  Talk to your pediatrician regarding the use of this medicine in children. Special care may be needed.  Overdosage: If you think you have taken too much of this   medicine contact a poison control center or emergency room at once.  NOTE: This medicine is only for you. Do not share this medicine with others.  What if I miss a dose?  It is important not to miss your dose. Call your doctor or health care professional if you are unable to keep an appointment.  What may interact with this medicine?  -medicines to increase blood counts like filgrastim, pegfilgrastim, sargramostim  -some other chemotherapy drugs like cisplatin  -vaccines  Talk to your doctor or health care professional before taking any of these medicines:  -acetaminophen  -aspirin  -ibuprofen  -ketoprofen  -naproxen  This list may not describe all possible interactions. Give your health care provider a list of all the medicines, herbs, non-prescription drugs, or dietary supplements you use. Also tell them if you smoke, drink alcohol, or use  illegal drugs. Some items may interact with your medicine.  What should I watch for while using this medicine?  Visit your doctor for checks on your progress. This drug may make you feel generally unwell. This is not uncommon, as chemotherapy can affect healthy cells as well as cancer cells. Report any side effects. Continue your course of treatment even though you feel ill unless your doctor tells you to stop.  In some cases, you may be given additional medicines to help with side effects. Follow all directions for their use.  Call your doctor or health care professional for advice if you get a fever, chills or sore throat, or other symptoms of a cold or flu. Do not treat yourself. This drug decreases your body's ability to fight infections. Try to avoid being around people who are sick.  This medicine may increase your risk to bruise or bleed. Call your doctor or health care professional if you notice any unusual bleeding.  Be careful brushing and flossing your teeth or using a toothpick because you may get an infection or bleed more easily. If you have any dental work done, tell your dentist you are receiving this medicine.  Avoid taking products that contain aspirin, acetaminophen, ibuprofen, naproxen, or ketoprofen unless instructed by your doctor. These medicines may hide a fever.  Women should inform their doctor if they wish to become pregnant or think they might be pregnant. There is a potential for serious side effects to an unborn child. Talk to your health care professional or pharmacist for more information. Do not breast-feed an infant while taking this medicine.  What side effects may I notice from receiving this medicine?  Side effects that you should report to your doctor or health care professional as soon as possible:  -allergic reactions like skin rash, itching or hives, swelling of the face, lips, or tongue  -low blood counts - this medicine may decrease the number of white blood cells, red  blood cells and platelets. You may be at increased risk for infections and bleeding.  -signs of infection - fever or chills, cough, sore throat, pain or difficulty passing urine  -signs of decreased platelets or bleeding - bruising, pinpoint red spots on the skin, black, tarry stools, blood in the urine  -signs of decreased red blood cells - unusually weak or tired, fainting spells, lightheadedness  -breathing problems  -chest pain  -mouth sores  -nausea and vomiting  -pain, swelling, redness at site where injected  -pain, tingling, numbness in the hands or feet  -stomach pain  -swelling of ankles, feet, hands  -unusual bleeding  Side effects that usually do

## 2016-02-19 ENCOUNTER — Other Ambulatory Visit: Payer: Self-pay | Admitting: Hematology and Oncology

## 2016-02-19 ENCOUNTER — Other Ambulatory Visit: Payer: Self-pay | Admitting: General Surgery

## 2016-02-19 ENCOUNTER — Inpatient Hospital Stay: Payer: Medicare Other

## 2016-02-19 ENCOUNTER — Telehealth: Payer: Self-pay | Admitting: Hematology and Oncology

## 2016-02-19 ENCOUNTER — Telehealth: Payer: Self-pay

## 2016-02-19 DIAGNOSIS — D696 Thrombocytopenia, unspecified: Secondary | ICD-10-CM | POA: Insufficient documentation

## 2016-02-19 DIAGNOSIS — K769 Liver disease, unspecified: Secondary | ICD-10-CM

## 2016-02-19 DIAGNOSIS — C259 Malignant neoplasm of pancreas, unspecified: Secondary | ICD-10-CM | POA: Diagnosis not present

## 2016-02-19 DIAGNOSIS — K8689 Other specified diseases of pancreas: Secondary | ICD-10-CM

## 2016-02-19 LAB — CBC WITH DIFFERENTIAL/PLATELET
Basophils Absolute: 0 10*3/uL (ref 0–0.1)
Basophils Absolute: 0 10*3/uL (ref 0–0.1)
Basophils Relative: 1 %
Basophils Relative: 1 %
Eosinophils Absolute: 0.1 10*3/uL (ref 0–0.7)
Eosinophils Absolute: 0.1 10*3/uL (ref 0–0.7)
Eosinophils Relative: 1 %
Eosinophils Relative: 1 %
HCT: 34.5 % — ABNORMAL LOW (ref 40.0–52.0)
HCT: 32.9 % — ABNORMAL LOW (ref 40.0–52.0)
Hemoglobin: 11.8 g/dL — ABNORMAL LOW (ref 13.0–18.0)
Hemoglobin: 11.1 g/dL — ABNORMAL LOW (ref 13.0–18.0)
Lymphocytes Relative: 10 %
Lymphocytes Relative: 9 %
Lymphs Abs: 0.8 10*3/uL — ABNORMAL LOW (ref 1.0–3.6)
Lymphs Abs: 0.6 10*3/uL — ABNORMAL LOW (ref 1.0–3.6)
MCH: 31.2 pg (ref 26.0–34.0)
MCH: 31 pg (ref 26.0–34.0)
MCHC: 34.1 g/dL (ref 32.0–36.0)
MCHC: 33.8 g/dL (ref 32.0–36.0)
MCV: 91.5 fL (ref 80.0–100.0)
MCV: 91.6 fL (ref 80.0–100.0)
Monocytes Absolute: 0.9 10*3/uL (ref 0.2–1.0)
Monocytes Absolute: 0.6 10*3/uL (ref 0.2–1.0)
Monocytes Relative: 11 %
Monocytes Relative: 9 %
Neutro Abs: 5.8 10*3/uL (ref 1.4–6.5)
Neutro Abs: 5.3 10*3/uL (ref 1.4–6.5)
Neutrophils Relative %: 77 %
Neutrophils Relative %: 80 %
Platelets: 77 10*3/uL — ABNORMAL LOW (ref 150–440)
Platelets: 70 10*3/uL — ABNORMAL LOW (ref 150–400)
RBC: 3.77 MIL/uL — ABNORMAL LOW (ref 4.40–5.90)
RBC: 3.59 MIL/uL — ABNORMAL LOW (ref 4.40–5.90)
RDW: 13.9 % (ref 11.5–14.5)
RDW: 14.1 % (ref 11.5–14.5)
WBC: 7.5 10*3/uL (ref 3.8–10.6)
WBC: 6.5 10*3/uL (ref 3.8–10.6)

## 2016-02-19 LAB — CULTURE, BLOOD (ROUTINE X 2)
Culture: NO GROWTH
Culture: NO GROWTH

## 2016-02-19 LAB — PROTIME-INR
INR: 1.09
Prothrombin Time: 14.3 seconds (ref 11.4–15.0)

## 2016-02-19 LAB — BASIC METABOLIC PANEL
Anion gap: 6 (ref 5–15)
BUN: 24 mg/dL — ABNORMAL HIGH (ref 6–20)
CO2: 27 mmol/L (ref 22–32)
Calcium: 8.1 mg/dL — ABNORMAL LOW (ref 8.9–10.3)
Chloride: 99 mmol/L — ABNORMAL LOW (ref 101–111)
Creatinine, Ser: 1.15 mg/dL (ref 0.61–1.24)
GFR calc Af Amer: >60 mL/min (ref >60)
GFR calc non Af Amer: 58 mL/min — ABNORMAL LOW (ref >60)
Glucose, Bld: 130 mg/dL — ABNORMAL HIGH (ref 65–99)
Potassium: 3.4 mmol/L — ABNORMAL LOW (ref 3.5–5.1)
Sodium: 132 mmol/L — ABNORMAL LOW (ref 135–145)

## 2016-02-19 LAB — APTT: aPTT: 32 seconds (ref 24–36)

## 2016-02-19 LAB — FIBRINOGEN: Fibrinogen: 296 mg/dL (ref 210–470)

## 2016-02-19 NOTE — Telephone Encounter (Signed)
Re:  Thrombocytopenia  Called patient about unexpected low platelet count (77,000).  Patient denies any herbal products or new medications (except for Levaquin). He does not some frankincense that his daughter has put on his feet. He notes a rash on his chest and back.  He denies any bruising or bleeding.  Discuss postponing his liver biopsy and port-a-cath placement until his platelet count returns to normal. His CT guided biopsy will be cancelled.  His referral for port placement will be notified.  Nursing to contact.  Labs will be ordered for evaluation.  Lequita Asal, MD

## 2016-02-19 NOTE — Telephone Encounter (Signed)
Called and spoke pt about needing to come back today about labs per MD for low platelet count.  Scheduling aware to cancel biopsy for tomorrow.  Pt to see MD Thursday after Chemo class.  No other concerns noted.

## 2016-02-19 NOTE — Telephone Encounter (Signed)
Called and spoke with Pam in radiology ultrasound guided liver biopsy cancelled for Tuesday per MD Dr. Mike Gip

## 2016-02-20 ENCOUNTER — Ambulatory Visit: Payer: Medicare Other

## 2016-02-20 ENCOUNTER — Ambulatory Visit: Admission: RE | Admit: 2016-02-20 | Payer: Medicare Other | Source: Ambulatory Visit

## 2016-02-20 ENCOUNTER — Ambulatory Visit: Payer: Medicare Other | Admitting: Hematology and Oncology

## 2016-02-20 LAB — PATHOLOGIST SMEAR REVIEW: Path Review: ADEQUATE

## 2016-02-20 LAB — CANCER ANTIGEN 19-9: CA 19-9: 227038 U/mL — ABNORMAL HIGH (ref 0–35)

## 2016-02-20 LAB — HEPATITIS B SURFACE ANTIGEN: Hepatitis B Surface Ag: NEGATIVE

## 2016-02-21 ENCOUNTER — Ambulatory Visit (INDEPENDENT_AMBULATORY_CARE_PROVIDER_SITE_OTHER): Payer: Medicare Other | Admitting: General Surgery

## 2016-02-21 ENCOUNTER — Encounter: Payer: Self-pay | Admitting: General Surgery

## 2016-02-21 VITALS — BP 140/82 | HR 88 | Resp 12 | Ht 71.0 in | Wt 170.0 lb

## 2016-02-21 DIAGNOSIS — C259 Malignant neoplasm of pancreas, unspecified: Secondary | ICD-10-CM | POA: Diagnosis not present

## 2016-02-21 LAB — CA 19-9 (SERIAL): CA 19 9: 83355 U/mL — AB (ref 0–35)

## 2016-02-21 NOTE — Patient Instructions (Addendum)
The patient is aware to call back for any questions or concerns. Implanted [facility] Guide An implanted port is a type of central line that is placed under the skin. Central lines are used to provide IV access when treatment or nutrition needs to be given through a person's veins. Implanted ports are used for long-term IV access. An implanted port may be placed because:   You need IV medicine that would be irritating to the small veins in your hands or arms.   You need long-term IV medicines, such as antibiotics.   You need IV nutrition for a long period.   You need frequent blood draws for lab tests.   You need dialysis.  Implanted ports are usually placed in the chest area, but they can also be placed in the upper arm, the abdomen, or the leg. An implanted port has two main parts:   Reservoir. The reservoir is round and will appear as a small, raised area under your skin. The reservoir is the part where a needle is inserted to give medicines or draw blood.   Catheter. The catheter is a thin, flexible tube that extends from the reservoir. The catheter is placed into a large vein. Medicine that is inserted into the reservoir goes into the catheter and then into the vein.  HOW WILL I CARE FOR MY INCISION SITE? Do not get the incision site wet. Bathe or shower as directed by your health care provider.  HOW IS MY PORT ACCESSED? Special steps must be taken to access the port:   Before the port is accessed, a numbing cream can be placed on the skin. This helps numb the skin over the port site.   Your health care provider uses a sterile technique to access the port.  Your health care provider must put on a mask and sterile gloves.  The skin over your port is cleaned carefully with an antiseptic and allowed to dry.  The port is gently pinched between sterile gloves, and a needle is inserted into the port.  Only "non-coring" port needles should be used to access the port. Once the  port is accessed, a blood return should be checked. This helps ensure that the port is in the vein and is not clogged.   If your port needs to remain accessed for a constant infusion, a clear (transparent) bandage will be placed over the needle site. The bandage and needle will need to be changed every week, or as directed by your health care provider.   Keep the bandage covering the needle clean and dry. Do not get it wet. Follow your health care provider's instructions on how to take a shower or bath while the port is accessed.   If your port does not need to stay accessed, no bandage is needed over the port.  WHAT IS FLUSHING? Flushing helps keep the port from getting clogged. Follow your health care provider's instructions on how and when to flush the port. Ports are usually flushed with saline solution or a medicine called heparin. The need for flushing will depend on how the port is used.   If the port is used for intermittent medicines or blood draws, the port will need to be flushed:   After medicines have been given.   After blood has been drawn.   As part of routine maintenance.   If a constant infusion is running, the port may not need to be flushed.  HOW LONG WILL MY PORT STAY IMPLANTED?  The port can stay in for as long as your health care provider thinks it is needed. When it is time for the port to come out, surgery will be done to remove it. The procedure is similar to the one performed when the port was put in.  WHEN SHOULD I SEEK IMMEDIATE MEDICAL CARE? When you have an implanted port, you should seek immediate medical care if:   You notice a bad smell coming from the incision site.   You have swelling, redness, or drainage at the incision site.   You have more swelling or pain at the port site or the surrounding area.   You have a fever that is not controlled with medicine.   This information is not intended to replace advice given to you by your health  care provider. Make sure you discuss any questions you have with your health care provider.   Document Released: 11/25/2005 Document Revised: 09/15/2013 Document Reviewed: 08/02/2013 Elsevier Interactive Patient Education 2016 [place].  Patient's surgery has been scheduled for 02-27-16 at [facility].

## 2016-02-21 NOTE — Progress Notes (Signed)
Patient ID: Cody Andrade, male   DOB: 06/08/1933, 82 y.o.   MRN: [id number]  Chief Complaint  Patient presents with  . Other    port placement    HPI [name] is a 82 y.o. male.  Here today to discuss having a port placed for chemotherapy for metastatic pancreatic cancer. A true biopsy has not been completed, platelets are low. He is here today with his daughter, Cody Andrade. Initial symptoms were abdominal pain and jaundice. PET scan 02/15/2016  He underwent ERCP with stent on 02/09/2016 by Dr. Lucilla Lame. I have reviewed the history of present illness with the patient. HPI  Past Medical History  Diagnosis Date  . TIA (transient ischemic attack)   . Hypertension   . Diverticulitis   . Cancer [facility])     pancreatic ca    Past Surgical History  Procedure Laterality Date  . Hernia repair    . Ercp N/A 02/09/2016    Procedure: ENDOSCOPIC RETROGRADE CHOLANGIOPANCREATOGRAPHY (ERCP);  Surgeon: Lucilla Lame, MD;  Location: [facility] ENDOSCOPY;  Service: Endoscopy;  Laterality: N/A;    Family History  Problem Relation Age of Onset  . Cancer Father 50    lung  . Breast cancer Maternal Aunt   . Brain cancer Maternal Aunt     Social History Social History  Substance Use Topics  . Smoking status: Former Smoker -- 25 years    Quit date: 12/09/1974  . Smokeless tobacco: Never Used  . Alcohol Use: 0.0 oz/week    0 Standard drinks or equivalent per week    Allergies  Allergen Reactions  . Lodine [Etodolac] Nausea And Vomiting    Current Outpatient Prescriptions  Medication Sig Dispense Refill  . gabapentin (NEURONTIN) 600 MG tablet Take 1 tablet by mouth 3 (three) times daily.    Marland Kitchen levofloxacin (LEVAQUIN) 750 MG tablet Take 1 tablet (750 mg total) by mouth daily. 14 tablet 0  . lisinopril-hydrochlorothiazide (PRINZIDE,ZESTORETIC) 20-25 MG tablet Take 1 tablet by mouth daily.    Marland Kitchen omeprazole (PRILOSEC) 20 MG capsule Take 1 capsule by mouth daily.    . ondansetron (ZOFRAN-ODT) 4  MG disintegrating tablet Take 1 tablet (4 mg total) by mouth every 6 (six) hours as needed for nausea. Reported on 02/14/2016 30 tablet 0  . oxyCODONE (OXY IR/ROXICODONE) 5 MG immediate release tablet Take 1 tablet (5 mg total) by mouth every 4 (four) hours as needed for moderate pain. 30 tablet 0  . simethicone (MYLICON) 80 MG chewable tablet Chew 80 mg by mouth every 6 (six) hours as needed for flatulence.    Marland Kitchen zolpidem (AMBIEN) 5 MG tablet Take 1 tablet (5 mg total) by mouth at bedtime as needed for sleep. 30 tablet 0   No current facility-administered medications for this visit.    Review of Systems Review of Systems  Constitutional: Negative.   Respiratory: Negative.   Cardiovascular: Negative.   Gastrointestinal: Positive for constipation. Negative for nausea and vomiting.    Blood pressure 140/82, pulse 88, resp. rate 12, height 5\' 11"  (1.803 m), weight 170 lb (77.111 kg).  Physical Exam Physical Exam  Constitutional: He is oriented to person, place, and time. He appears well-developed and well-nourished.  HENT:  Mouth/Throat: Oropharynx is clear and moist.  Eyes: Conjunctivae are normal. No scleral icterus.  Neck: Neck supple.  Cardiovascular: Normal rate, regular rhythm and normal heart sounds.   Pulmonary/Chest: Effort normal and breath sounds normal.  Abdominal: Soft. Normal appearance and bowel sounds are normal. There  is no hepatomegaly. There is no tenderness. No hernia.  Lymphadenopathy:    He has no cervical adenopathy.  Neurological: He is alert and oriented to person, place, and time.  Skin: Skin is warm and dry.  Psychiatric: His behavior is normal.    Data Reviewed Notes reviewed CA 19-9 is markedly elevted. Platelet count has benn trending down , was 70K 2 days ago Assessment    Suspected pancreatic cancer. Chemotherapy is currently scheduled and this visit is to discuss port placement. Discussed port placement in full with pt and he is agreeable.. Assuming  platelet count remains above 70K will proceed with port placement.     Plan    The risks associated with central venous access including arterial, pulmonary and venous injury were reviewed. The possible need for additional treatment if pulmonary injury occurs (chest tube placement) was discussed.      PCP:  Derinda Late This information has been scribed by Karie Fetch RNBC.    SANKAR,SEEPLAPUTHUR G 02/21/2016, 12:08 PM

## 2016-02-22 ENCOUNTER — Encounter: Payer: Self-pay | Admitting: Hematology and Oncology

## 2016-02-22 ENCOUNTER — Other Ambulatory Visit: Payer: Self-pay

## 2016-02-22 ENCOUNTER — Other Ambulatory Visit: Payer: Self-pay | Admitting: Hematology and Oncology

## 2016-02-22 ENCOUNTER — Inpatient Hospital Stay: Payer: Medicare Other

## 2016-02-22 ENCOUNTER — Inpatient Hospital Stay (HOSPITAL_BASED_OUTPATIENT_CLINIC_OR_DEPARTMENT_OTHER): Payer: Medicare Other | Admitting: Hematology and Oncology

## 2016-02-22 DIAGNOSIS — K7689 Other specified diseases of liver: Secondary | ICD-10-CM | POA: Diagnosis not present

## 2016-02-22 DIAGNOSIS — K869 Disease of pancreas, unspecified: Secondary | ICD-10-CM | POA: Diagnosis not present

## 2016-02-22 DIAGNOSIS — C259 Malignant neoplasm of pancreas, unspecified: Secondary | ICD-10-CM | POA: Diagnosis not present

## 2016-02-22 DIAGNOSIS — C250 Malignant neoplasm of head of pancreas: Secondary | ICD-10-CM

## 2016-02-22 DIAGNOSIS — Z79899 Other long term (current) drug therapy: Secondary | ICD-10-CM

## 2016-02-22 DIAGNOSIS — K831 Obstruction of bile duct: Secondary | ICD-10-CM

## 2016-02-22 DIAGNOSIS — Z87891 Personal history of nicotine dependence: Secondary | ICD-10-CM

## 2016-02-22 DIAGNOSIS — D696 Thrombocytopenia, unspecified: Secondary | ICD-10-CM | POA: Diagnosis not present

## 2016-02-22 DIAGNOSIS — R634 Abnormal weight loss: Secondary | ICD-10-CM | POA: Diagnosis not present

## 2016-02-22 LAB — CBC WITH DIFFERENTIAL/PLATELET
Basophils Absolute: 0.1 10*3/uL (ref 0–0.1)
Basophils Relative: 1 %
Eosinophils Absolute: 0.1 10*3/uL (ref 0–0.7)
Eosinophils Relative: 1 %
HCT: 32.7 % — ABNORMAL LOW (ref 40.0–52.0)
Hemoglobin: 11 g/dL — ABNORMAL LOW (ref 13.0–18.0)
Lymphocytes Relative: 6 %
Lymphs Abs: 0.6 10*3/uL — ABNORMAL LOW (ref 1.0–3.6)
MCH: 31.1 pg (ref 26.0–34.0)
MCHC: 33.8 g/dL (ref 32.0–36.0)
MCV: 92 fL (ref 80.0–100.0)
Monocytes Absolute: 1 10*3/uL (ref 0.2–1.0)
Monocytes Relative: 9 %
Neutro Abs: 9 10*3/uL — ABNORMAL HIGH (ref 1.4–6.5)
Neutrophils Relative %: 83 %
Platelets: 104 10*3/uL — ABNORMAL LOW (ref 150–440)
RBC: 3.55 MIL/uL — ABNORMAL LOW (ref 4.40–5.90)
RDW: 13.6 % (ref 11.5–14.5)
WBC: 10.7 10*3/uL — ABNORMAL HIGH (ref 3.8–10.6)

## 2016-02-22 LAB — HEPATIC FUNCTION PANEL
ALT: 77 U/L — ABNORMAL HIGH (ref 17–63)
AST: 51 U/L — ABNORMAL HIGH (ref 15–41)
Albumin: 3.3 g/dL — ABNORMAL LOW (ref 3.5–5.0)
Alkaline Phosphatase: 404 U/L — ABNORMAL HIGH (ref 38–126)
Bilirubin, Direct: 0.8 mg/dL — ABNORMAL HIGH (ref 0.1–0.5)
Indirect Bilirubin: 1.1 mg/dL — ABNORMAL HIGH (ref 0.3–0.9)
Total Bilirubin: 1.9 mg/dL — ABNORMAL HIGH (ref 0.3–1.2)
Total Protein: 6.4 g/dL — ABNORMAL LOW (ref 6.5–8.1)

## 2016-02-22 MED ORDER — LIDOCAINE-PRILOCAINE 2.5-2.5 % EX CREA: 1.0000 "application " | TOPICAL_CREAM | CUTANEOUS | Status: DC | PRN

## 2016-02-22 NOTE — Progress Notes (Signed)
Patient was not able to have his port placed due to low platelets and was rescheduled to 02/27/16.  Attended chemo class this morning and found this to be informative.  Does have constipation with a small bowel movement yesterday and a good bowel movement 4 days before that.  Was advised in chemo class that he can take Miralax for constipation during treatments so he is going to try Miralax.  Does not have any abdominal pain right now but the pain does seem to worsen late in the day and that is when he will take a pain medication.  He has not taken his Lisinopril/HCTZ for the past month because his blood pressure was within normal limits but has noticed an increase in ankle edema since stopping medication.

## 2016-02-22 NOTE — Progress Notes (Signed)
[facility] day:  02/16/2016  Chief Complaint: Cody Andrade is a 82 y.o. male with probable metastatic pancreatic cancer who is seen for initial clinic assessment after recent hospitalization.  HPI:  The patient was admitted to [facility] from 02/08/2016 - 02/11/2016.  He presented with fatigue, 20 pound weight loss, abdominal discomfort and jaundice. RUQ ultrasound on 02/08/2016 revealed biliary ductal dilatation as well as changes suggestive of hepatic metastatic disease. ERCP on 02/09/2016 by Dr. Jiles Garter revealed segmental biliary stricture. The upper third of the main bile duct was dilated. A biliary sphincterotomy was performed and a metal stent placed.  His bilirubin has been monitored.  Bilirubin was 9.3 on 02/08/2016 and 5.3 on 02/10/2016 and 2.4 on 02/14/2016.  Initial AST/ALT was 2100/466 with an alkaline phosphatase of 204.  On 02/14/2016, AST/ALT was 74/191 with an alkaline phosphatase of 310.  While hospitalized, creatinine improved from 2.04 to 1.23 with hydration.  CA19-9 was 83,355 on 02/10/2016.  During his hospitalization, I discussed his likely diagnosis of metastatic pancreatic cancer and palliative chemotherapy.  He presented to the emergency room on 02/14/2016 with fever 101.  Abdominal pain was stable.  Abdomen and pelvic CT scan on 02/14/2016 revealed a 2.3 x 3.2 cm mass at the head/ body of pancreas with pancreatic atrophy and ductal dilatation consistent with adenocarcinoma. There waslocal infiltration into the peripancreatic fat and celiac axis.  There were multiple hepatic metastases. There were indeterminate nodules in the left lung base.  There was a biliary stent with associated pneumobilia.  Blood cultures were drawn and are negative to date.  He received IVF and Rocephin.  He was discharged on Levaquin.  He underwent PET scan on 02/15/2016.  Imaging revealed a mass in body of pancreas which was intensely hypermetabolic  compatible with primary adenocarcinoma of the pancreas.  There were multifocal hypermetabolic liver metastases.  There was hypermetabolic mediastinal and left supraclavicular lymph nodes compatible with metastatic adenopathy.  There were numerous small pulmonary nodules, too small to characterize by PET-CT but suspicious for metastatic disease.  Symptomatically, he is doing "ok".  His appetite remains poor.  He is trying to eat.  He notes "indigestion pain".  He has lost 25 pounds since becoming symptomatic.  He is fatigued.  He is using Ambien to help with sleep.  Past Medical History  Diagnosis Date  . TIA (transient ischemic attack)   . Hypertension   . Diverticulitis   . Cancer [facility])     pancreatic ca    Past Surgical History  Procedure Laterality Date  . Hernia repair    . Ercp N/A 02/09/2016    Procedure: ENDOSCOPIC RETROGRADE CHOLANGIOPANCREATOGRAPHY (ERCP);  Surgeon: Lucilla Lame, MD;  Location: [facility] ENDOSCOPY;  Service: Endoscopy;  Laterality: N/A;    Family History  Problem Relation Age of Onset  . Cancer Father 28    lung  . Breast cancer Maternal Aunt   . Brain cancer Maternal Aunt     Social History:  reports that he quit smoking about 41 years ago. He has never used smokeless tobacco. He reports that he drinks alcohol. He reports that he does not use illicit drugs.  The patient is accompanied by his wife and 2 daughters today.  Allergies:  Allergies  Allergen Reactions  . Lodine [Etodolac] Nausea And Vomiting    Current Medications: Current Outpatient Prescriptions  Medication Sig Dispense Refill  . gabapentin (NEURONTIN) 600 MG tablet Take 1 tablet by mouth 3 (  three) times daily.    Marland Kitchen lisinopril-hydrochlorothiazide (PRINZIDE,ZESTORETIC) 20-25 MG tablet Take 1 tablet by mouth daily.    Marland Kitchen omeprazole (PRILOSEC) 20 MG capsule Take 1 capsule by mouth daily.    Marland Kitchen oxyCODONE (OXY IR/ROXICODONE) 5 MG immediate release tablet Take 1 tablet (5 mg total) by mouth every 4  (four) hours as needed for moderate pain. 30 tablet 0  . simethicone (MYLICON) 80 MG chewable tablet Chew 80 mg by mouth every 6 (six) hours as needed for flatulence.    Marland Kitchen zolpidem (AMBIEN) 5 MG tablet Take 1 tablet (5 mg total) by mouth at bedtime as needed for sleep. 30 tablet 0  . ondansetron (ZOFRAN-ODT) 4 MG disintegrating tablet Take 1 tablet (4 mg total) by mouth every 6 (six) hours as needed for nausea. Reported on 02/14/2016 30 tablet 0   No current facility-administered medications for this visit.    Review of Systems:  GENERAL:  Fatigue.  No fevers or sweats.  Weight loss of 25 pounds. PERFORMANCE STATUS (ECOG):  1-2 HEENT:  No visual changes, runny nose, sore throat, mouth sores or tenderness. Lungs: No shortness of breath or cough.  No hemoptysis. Cardiac:  No chest pain, palpitations, orthopnea, or PND. GI:  Indigestion pain.  Poor appetite.  Little nausea.  Constipation.  No vomiting, diarrhea, melena or hematochezia. GU:  No urgency, frequency, dysuria, or hematuria. Musculoskeletal:  Back pain.  No joint pain.  No muscle tenderness. Extremities:  No pain or swelling. Skin:  No rashes or skin changes. Neuro:  General weakness.  No headache, numbness or weakness, balance or coordination issues. Endocrine:  No diabetes, thyroid issues, hot flashes or night sweats. Psych:  No mood changes, depression or anxiety. Pain:  No focal pain. Review of systems:  All other systems reviewed and found to be negative.  Physical Exam: Blood pressure 109/67, pulse 109, temperature 98.8 F (37.1 C), temperature source Tympanic, resp. rate 18, height 5' 11"  (1.803 m), weight 169 lb 15.6 oz (77.1 kg), SpO2 98 %. GENERAL:  Well developed, well nourished, sitting comfortably in the exam room in no acute distress. MENTAL STATUS:  Alert and oriented to person, place and time. HEAD:  Pearline Cables hair.  Normocephalic, atraumatic, face symmetric, no Cushingoid features. EYES:  Glasses.  Hazel eyes.  Slight  icterus (improved).  Pupils equal round and reactive to light and accomodation.  No conjunctivitis. ENT:  Oropharynx clear without lesion.  Tongue normal. Mucous membranes moist.  RESPIRATORY:  Clear to auscultation without rales, wheezes or rhonchi. CARDIOVASCULAR:  Regular rate and rhythm without murmur, rub or gallop. ABDOMEN:  Soft, slightly tender in the epigastric region and RUQ without guarding or rebound tenderness. Active bowel sounds and no hepatosplenomegaly.  No masses. SKIN:  No rashes, ulcers or lesions. EXTREMITIES: No edema, no skin discoloration or tenderness.  No palpable cords. LYMPH NODES: No palpable cervical, supraclavicular, axillary or inguinal adenopathy  NEUROLOGICAL: Unremarkable. PSYCH:  Appropriate.  Hospital Outpatient Visit on 02/15/2016  Component Date Value Ref Range Status  . Glucose-Capillary 02/15/2016 109* 65 - 99 mg/dL Final  Admission on 02/14/2016, Discharged on 02/15/2016  Component Date Value Ref Range Status  . Sodium 02/14/2016 133* 135 - 145 mmol/L Final  . Potassium 02/14/2016 4.2  3.5 - 5.1 mmol/L Final  . Chloride 02/14/2016 99* 101 - 111 mmol/L Final  . CO2 02/14/2016 26  22 - 32 mmol/L Final  . Glucose, Bld 02/14/2016 126* 65 - 99 mg/dL Final  . BUN 02/14/2016 25* 6 -  20 mg/dL Final  . Creatinine, Ser 02/14/2016 1.23  0.61 - 1.24 mg/dL Final  . Calcium 02/14/2016 8.6* 8.9 - 10.3 mg/dL Final  . Total Protein 02/14/2016 6.1* 6.5 - 8.1 g/dL Final  . Albumin 02/14/2016 3.3* 3.5 - 5.0 g/dL Final  . AST 02/14/2016 74* 15 - 41 U/L Final  . ALT 02/14/2016 191* 17 - 63 U/L Final  . Alkaline Phosphatase 02/14/2016 310* 38 - 126 U/L Final  . Total Bilirubin 02/14/2016 2.4* 0.3 - 1.2 mg/dL Final  . GFR calc non Af Amer 02/14/2016 54* >60 mL/min Final  . GFR calc Af Amer 02/14/2016 >60  >60 mL/min Final   Comment: (NOTE) The eGFR has been calculated using the CKD EPI equation. This calculation has not been validated in all clinical  situations. eGFR's persistently <60 mL/min signify possible Chronic Kidney Disease.   . Anion gap 02/14/2016 8  5 - 15 Final  . Lipase 02/14/2016 30  11 - 51 U/L Final  . WBC 02/14/2016 7.8  3.8 - 10.6 K/uL Final  . RBC 02/14/2016 3.65* 4.40 - 5.90 MIL/uL Final  . Hemoglobin 02/14/2016 11.5* 13.0 - 18.0 g/dL Final  . HCT 02/14/2016 33.6* 40.0 - 52.0 % Final  . MCV 02/14/2016 92.2  80.0 - 100.0 fL Final  . MCH 02/14/2016 31.4  26.0 - 34.0 pg Final  . MCHC 02/14/2016 34.1  32.0 - 36.0 g/dL Final  . RDW 02/14/2016 13.6  11.5 - 14.5 % Final  . Platelets 02/14/2016 99* 150 - 440 K/uL Final  . Neutrophils Relative % 02/14/2016 86%   Final  . Neutro Abs 02/14/2016 6.6* 1.4 - 6.5 K/uL Final  . Lymphocytes Relative 02/14/2016 5%   Final  . Lymphs Abs 02/14/2016 0.4* 1.0 - 3.6 K/uL Final  . Monocytes Relative 02/14/2016 9%   Final  . Monocytes Absolute 02/14/2016 0.7  0.2 - 1.0 K/uL Final  . Eosinophils Relative 02/14/2016 0%   Final  . Eosinophils Absolute 02/14/2016 0.0  0 - 0.7 K/uL Final  . Basophils Relative 02/14/2016 0%   Final  . Basophils Absolute 02/14/2016 0.0  0 - 0.1 K/uL Final  . Influenza A ([facility]) 02/14/2016 NEGATIVE  NEGATIVE Final  . Influenza B ([facility]) 02/14/2016 NEGATIVE  NEGATIVE Final  . Specimen Description 02/14/2016 URINE, RANDOM   Final  . Special Requests 02/14/2016 NONE   Final  . Culture 02/14/2016 NO GROWTH 2 DAYS   Final  . Report Status 02/14/2016 02/16/2016 FINAL   Final  . Color, Urine 02/14/2016 AMBER* YELLOW Final  . APPearance 02/14/2016 CLEAR* CLEAR Final  . Glucose, UA 02/14/2016 NEGATIVE  NEGATIVE mg/dL Final  . Bilirubin Urine 02/14/2016 NEGATIVE  NEGATIVE Final  . Ketones, ur 02/14/2016 TRACE* NEGATIVE mg/dL Final  . Specific Gravity, Urine 02/14/2016 1.026  1.005 - 1.030 Final  . Hgb urine dipstick 02/14/2016 NEGATIVE  NEGATIVE Final  . pH 02/14/2016 5.0  5.0 - 8.0 Final  . Protein, ur 02/14/2016 30* NEGATIVE mg/dL Final  . Nitrite 02/14/2016  NEGATIVE  NEGATIVE Final  . Leukocytes, UA 02/14/2016 NEGATIVE  NEGATIVE Final  . RBC / HPF 02/14/2016 0-5  0 - 5 RBC/hpf Final  . WBC, UA 02/14/2016 0-5  0 - 5 WBC/hpf Final  . Bacteria, UA 02/14/2016 NONE SEEN  NONE SEEN Final  . Squamous Epithelial / LPF 02/14/2016 0-5* NONE SEEN Final  . Mucous 02/14/2016 PRESENT   Final  . Specimen Description 02/14/2016 BLOOD RIGHT ANTECUBITAL   Final  . Special Requests  02/14/2016 BOTTLES DRAWN AEROBIC AND ANAEROBIC 5ML   Final  . Culture 02/14/2016 NO GROWTH 5 DAYS   Final  . Report Status 02/14/2016 02/19/2016 FINAL   Final  . Specimen Description 02/14/2016 BLOOD LEFT ANTECUBITAL   Final  . Special Requests 02/14/2016 BOTTLES DRAWN AEROBIC AND ANAEROBIC 5ML   Final  . Culture 02/14/2016 NO GROWTH 5 DAYS   Final  . Report Status 02/14/2016 02/19/2016 FINAL   Final    Assessment:  Cody Andrade is a 82 y.o. male with probable metastatic pancreatic cancer.  He presented on 02/08/2016 with fatigue, 20 pound weight loss, abdominal discomfort and jaundice (bilirubin 9.3). ERCP on 02/09/2016 revealed segmental biliary stricture. The upper third of the main bile duct was dilated. A biliary sphincterotomy was performed and a metal stent placed.  CA19-9 was 83,355 on 02/10/2016.  PET scan on 02/15/2016 revealed a mass in body of pancreas which was intensely hypermetabolic compatible with primary adenocarcinoma of the pancreas.  There were multifocal hypermetabolic liver metastases.  There was hypermetabolic mediastinal and left supraclavicular lymph nodes compatible with metastatic adenopathy.  There were numerous small pulmonary nodules, too small to characterize by PET-CT but suspicious for metastatic disease.  He presented to the emergency room on 02/14/2016 with fever.  Abdomen and pelvic CT scan on 02/14/2016 revealed a 2.3 x 3.2 cm mass at the head/ body of pancreas with pancreatic atrophy and ductal dilatation consistent with adenocarcinoma. There  waslocal infiltration into the peripancreatic fat and celiac axis.  There were multiple hepatic metastases. There were indeterminate nodules in the left lung base.  There was a biliary stent with associated pneumobilia.  Cultures were drawn and are negative to date.  He received IVF and Rocephin.  He was discharged on Levaquin.  Symptomatically, he is fatigued.  He has lost 25 pounds.  Exam reveals improved scleral icterus.  He is slightly tender int he epigastric region and RUQ.  Plan: 1.  Review labs and imaging to date.  Discuss diagnosis of metastatic pancreatic cancer.  Discuss prognosis and natural history of disease.  Discuss biopsy of a liver lesion to confirm diagnosis.  Discuss palliative chemotherapy with gemcitabine and Abraxane.  Discus response rates and side effects.  Discuss chemotherapy class.  Discuss port-a-cath placement.  Discuss completion of antibiotics.  Follow-up cultures. 2.  Schedule CT guided liver biopsy.  Patient is to hold Aggrenox. 3.  Labs prior to procedure (CBC with diff, PT/INR). 4.  Schedule chemotherapy class. 5.  Schedule port-a-cath placement.   6.  Preauthorize gemcitabine and Abraxane. 7.  Discuss importance of nutrition and fluid intake. 8.  Rx:  Ondansetron 4 mg SL q 6 hours prn nausea. 9.  RTC after biopsy to finalize treatment plan.   Lequita Asal, MD  02/16/2016

## 2016-02-22 NOTE — Progress Notes (Signed)
[facility] day:  02/22/2016  Chief Complaint: Cody Andrade is a 82 y.o. male with probable metastatic pancreatic cancer who is seen for reassessment.  HPI:  The patient was last seen in the medical oncology clinic on 02/16/2016.  At that time, he was seen for assessment following his initial hospitalization.  We discussed plans for ultrasound guided biopsy of a liver lesion to confirm metastatic pancreatic cancer.  We discussed chemotherapy with gemcitabine and Abraxane.  We discussed port-a-cath placement.   Prior to scheduled procedures, he underwent pre-op labs.  CBC on 02/19/2016 unexpectantly revealed a platelet count of 77,000.  PT was 14.3 with an INR of 1.09.  Procedures were cancelled.  He did meet with Dr Jamal Collin on 02/21/2016 to discuss port-a-cath placement.  He underwent work-up of his thrombocytopenia on 02/19/2016.  Normal labs included a PTT (32), fibrinogen (296), creatinine (1.15), ANA, hepatitis B surface antigen, hepatitis B core total antibody, and hepatitis C antibody.  Platelet count in a blue top tube ruled out pseudo-thrombocytopenia.  Hepatin induced thrombocytopenia (HIT) testing and serotonin release assay are pending.  Peripheral smear revealed a mild normocytic anemia with rare red cell fragments, absolute lymphopenia, thrombocytopenia with variation in size and normal granularity.  He attended the chemotherapy class.  He has had some issues with constipation.  He is planning on using Miralax.  He has some abdominal discomfort at night.  He notes some increased ankle edema since discontinuation of lisinopril/HCTZ.   Past Medical History  Diagnosis Date  . TIA (transient ischemic attack)   . Hypertension   . Diverticulitis   . Cancer ([place])     pancreatic ca  . GERD (gastroesophageal reflux disease)   . History of hiatal hernia   . History of colonic polyps     Past Surgical History  Procedure Laterality Date  .  Hernia repair    . Ercp N/A 02/09/2016    Procedure: ENDOSCOPIC RETROGRADE CHOLANGIOPANCREATOGRAPHY (ERCP);  Surgeon: Lucilla Lame, MD;  Location[facility] ENDOSCOPY;  Service: Endoscopy;  Laterality: N/A;  . Skin cancer excision      x 3  . Colonoscopy    . Esophagogastroduodenoscopy      Family History  Problem Relation Age of Onset  . Cancer Father 16    lung  . Breast cancer Maternal Aunt   . Brain cancer Maternal Aunt     Social History:  reports that he quit smoking about 41 years ago. He has never used smokeless tobacco. He reports that he drinks alcohol. He reports that he does not use illicit drugs.  The patient is accompanied by his daughter today.  Allergies:  Allergies  Allergen Reactions  . Lodine [Etodolac] Nausea And Vomiting    Current Medications: Current Outpatient Prescriptions  Medication Sig Dispense Refill  . ondansetron (ZOFRAN-ODT) 4 MG disintegrating tablet Take 1 tablet (4 mg total) by mouth every 6 (six) hours as needed for nausea. Reported on 02/14/2016 30 tablet 0  . oxyCODONE (OXY IR/ROXICODONE) 5 MG immediate release tablet Take 1 tablet (5 mg total) by mouth every 4 (four) hours as needed for moderate pain. 30 tablet 0  . simethicone (MYLICON) 80 MG chewable tablet Chew 80 mg by mouth every 6 (six) hours as needed for flatulence.    . gabapentin (NEURONTIN) 600 MG tablet Take 1 tablet by mouth 3 (three) times daily. Reported on 02/23/2016    . levofloxacin (LEVAQUIN) 750 MG tablet Take 750 mg by  mouth daily.    Marland Kitchen lidocaine-prilocaine (EMLA) cream Apply 1 application topically as needed. Apply 1/2 tablespoon 1-2 hours prior port being used and cover 30 g 0  . lisinopril-hydrochlorothiazide (PRINZIDE,ZESTORETIC) 20-25 MG tablet Take 1 tablet by mouth daily. Reported on 02/23/2016    . omeprazole (PRILOSEC) 20 MG capsule Take 1 capsule by mouth daily. Reported on 02/23/2016    . Polyethylene Glycol 3350 (MIRALAX PO) Take 1 Dose by mouth daily.    Marland Kitchen zolpidem  (AMBIEN) 5 MG tablet Take 1 tablet (5 mg total) by mouth at bedtime as needed for sleep. 30 tablet 0   No current facility-administered medications for this visit.    Review of Systems:  GENERAL:  Fatigue.  No fevers or sweats.  Weight loss of 25 pounds. PERFORMANCE STATUS (ECOG):  1-2 HEENT:  No visual changes, runny nose, sore throat, mouth sores or tenderness. Lungs: No shortness of breath or cough.  No hemoptysis. Cardiac:  No chest pain, palpitations, orthopnea, or PND. GI:  Indigestion pain.  Poor appetite.  Little nausea.  Constipation.  No vomiting, diarrhea, melena or hematochezia. GU:  No urgency, frequency, dysuria, or hematuria. Musculoskeletal:  Back pain.  No joint pain.  No muscle tenderness. Extremities:  No pain or swelling. Skin:  No rashes or skin changes. Neuro:  General weakness.  No headache, numbness or weakness, balance or coordination issues. Endocrine:  No diabetes, thyroid issues, hot flashes or night sweats. Psych:  No mood changes, depression or anxiety. Pain:  No focal pain. Review of systems:  All other systems reviewed and found to be negative.  Physical Exam: There were no vitals taken for this visit. GENERAL:  Well developed, well nourished, sitting comfortably in the exam room in no acute distress. MENTAL STATUS:  Alert and oriented to person, place and time. HEAD:  Pearline Cables hair.  Normocephalic, atraumatic, face symmetric, no Cushingoid features. EYES:  Glasses.  Hazel eyes.  Slight icterus.  No conjunctivitis. EXTREMITIES: Trace ankle edema.  No skin discoloration or tenderness.  No palpable cords. NEUROLOGICAL: Unremarkable. PSYCH:  Appropriate.  Office Visit on 02/22/2016  Component Date Value Ref Range Status  . WBC 02/22/2016 10.7* 3.8 - 10.6 K/uL Final  . RBC 02/22/2016 3.55* 4.40 - 5.90 MIL/uL Final  . Hemoglobin 02/22/2016 11.0* 13.0 - 18.0 g/dL Final  . HCT 02/22/2016 32.7* 40.0 - 52.0 % Final  . MCV 02/22/2016 92.0  80.0 - 100.0 fL Final   . MCH 02/22/2016 31.1  26.0 - 34.0 pg Final  . MCHC 02/22/2016 33.8  32.0 - 36.0 g/dL Final  . RDW 02/22/2016 13.6  11.5 - 14.5 % Final  . Platelets 02/22/2016 104* 150 - 440 K/uL Final  . Neutrophils Relative % 02/22/2016 83   Final  . Neutro Abs 02/22/2016 9.0* 1.4 - 6.5 K/uL Final  . Lymphocytes Relative 02/22/2016 6   Final  . Lymphs Abs 02/22/2016 0.6* 1.0 - 3.6 K/uL Final  . Monocytes Relative 02/22/2016 9   Final  . Monocytes Absolute 02/22/2016 1.0  0.2 - 1.0 K/uL Final  . Eosinophils Relative 02/22/2016 1   Final  . Eosinophils Absolute 02/22/2016 0.1  0 - 0.7 K/uL Final  . Basophils Relative 02/22/2016 1   Final  . Basophils Absolute 02/22/2016 0.1  0 - 0.1 K/uL Final  . Total Protein 02/22/2016 6.4* 6.5 - 8.1 g/dL Final  . Albumin 02/22/2016 3.3* 3.5 - 5.0 g/dL Final  . AST 02/22/2016 51* 15 - 41 U/L Final  .  ALT 02/22/2016 77* 17 - 63 U/L Final  . Alkaline Phosphatase 02/22/2016 404* 38 - 126 U/L Final  . Total Bilirubin 02/22/2016 1.9* 0.3 - 1.2 mg/dL Final  . Bilirubin, Direct 02/22/2016 0.8* 0.1 - 0.5 mg/dL Final  . Indirect Bilirubin 02/22/2016 1.1* 0.3 - 0.9 mg/dL Final    Assessment:  Cody Andrade is a 82 y.o. male with probable metastatic pancreatic cancer.  He presented on 02/08/2016 with fatigue, 20 pound weight loss, abdominal discomfort and jaundice (bilirubin 9.3). ERCP on 02/09/2016 revealed segmental biliary stricture. The upper third of the main bile duct was dilated. A biliary sphincterotomy was performed and a metal stent placed.  CA19-9 was 83,355 on 02/10/2016 and 227,038 on 02/19/2016.  PET scan on 02/15/2016 revealed a mass in body of pancreas which was intensely hypermetabolic compatible with primary adenocarcinoma of the pancreas.  There were multifocal hypermetabolic liver metastases.  There was hypermetabolic mediastinal and left supraclavicular lymph nodes compatible with metastatic adenopathy.  There were numerous small pulmonary nodules, too  small to characterize by PET-CT but suspicious for metastatic disease.  He presented to the emergency room on 02/14/2016 with fever.  Abdomen and pelvic CT scan on 02/14/2016 revealed a 2.3 x 3.2 cm mass at the head/ body of pancreas with pancreatic atrophy and ductal dilatation consistent with adenocarcinoma. There waslocal infiltration into the peripancreatic fat and celiac axis.  There were multiple hepatic metastases. There were indeterminate nodules in the left lung base.  There was a biliary stent with associated pneumobilia.  Cultures are negative.  He received Rocephin and was discharged on Levaquin.  He developed thrombocytopenia (platelet count 77,0000) on 02/19/2016.  Work-up reveals the following normal labs:  PTT, fibrinogen, creatinine, ANA, hepatitis B surface antigen, hepatitis B core total antibody, and hepatitis C antibody.  Hepatin induced thrombocytopenia (HIT) testing and serotonin release assay are pending.  Symptomatically, he is fatigued.  He has lost 25 pounds.  Exam reveals improved scleral icterus.  He denies any bruising or bleeding.  Plan: 1.  Review thrombocytopenia work-up and pending labs.  Discuss unclear etiology of thrombocytopenia (drug induced) or consumption due to infection.  Incidence of thrombocytopenia secondary to Levaquin < 1%. 2.  Reschedule procedures (ultrasound guided liver biopsy and port-a-cath placement) once platelet count improves/normalizes. 3.  Continue to hold Aggrenox. 4.  Schedule pre-op labs (CBC with diff, PT/INR) prior to rescheduled procedures. 5.  Labs today:  CBC with diff, LFTs 6.  RTC for MD assessment, review of biopsy, labs (CBC with diff, CMP, Mg), and cycle #1 Abraxane and gemcitabine.  Addendum:  Platelet count is 104,000 today.  HIT assay and serotonin release assay are negative.  There is no evidence of heparin associated thrombocytopenia.  Reschedule procedures.   Lequita Asal, MD  02/22/2016, 12:08 PM

## 2016-02-23 ENCOUNTER — Other Ambulatory Visit: Payer: Self-pay

## 2016-02-23 ENCOUNTER — Telehealth: Payer: Self-pay | Admitting: *Deleted

## 2016-02-23 ENCOUNTER — Encounter
Admission: RE | Admit: 2016-02-23 | Discharge: 2016-02-23 | Disposition: A | Discharge status: Home / Self Care | Payer: Medicare Other | Source: Ambulatory Visit | Attending: General Surgery | Admitting: General Surgery

## 2016-02-23 ENCOUNTER — Other Ambulatory Visit: Payer: Self-pay | Admitting: Hematology and Oncology

## 2016-02-23 ENCOUNTER — Ambulatory Visit: Payer: Medicare Other | Admitting: Hematology and Oncology

## 2016-02-23 DIAGNOSIS — C250 Malignant neoplasm of head of pancreas: Secondary | ICD-10-CM | POA: Insufficient documentation

## 2016-02-23 DIAGNOSIS — K838 Other specified diseases of biliary tract: Secondary | ICD-10-CM | POA: Diagnosis not present

## 2016-02-23 DIAGNOSIS — K7689 Other specified diseases of liver: Secondary | ICD-10-CM | POA: Insufficient documentation

## 2016-02-23 DIAGNOSIS — D490 Neoplasm of unspecified behavior of digestive system: Secondary | ICD-10-CM | POA: Diagnosis not present

## 2016-02-23 DIAGNOSIS — E43 Unspecified severe protein-calorie malnutrition: Secondary | ICD-10-CM | POA: Insufficient documentation

## 2016-02-23 DIAGNOSIS — I10 Essential (primary) hypertension: Secondary | ICD-10-CM | POA: Diagnosis not present

## 2016-02-23 DIAGNOSIS — Z8673 Personal history of transient ischemic attack (TIA), and cerebral infarction without residual deficits: Secondary | ICD-10-CM | POA: Insufficient documentation

## 2016-02-23 HISTORY — DX: Personal history of colon polyps, unspecified: Z86.0100

## 2016-02-23 HISTORY — DX: Personal history of other diseases of the digestive system: Z87.19

## 2016-02-23 HISTORY — DX: Personal history of colonic polyps: Z86.010

## 2016-02-23 HISTORY — DX: Gastro-esophageal reflux disease without esophagitis: K21.9

## 2016-02-23 LAB — SURGICAL PCR SCREEN
MRSA, PCR: NEGATIVE
Staphylococcus aureus: NEGATIVE

## 2016-02-23 NOTE — Patient Instructions (Signed)
  Your procedure is scheduled on: Tuesday 02/27/16 Report to Day Surgery. 2ND FLOOR MEDICAL MALL ENTRANCE To find out your arrival time please call [phone] between 1PM - 3PM on Monday 02/26/16.  Remember: Instructions that are not followed completely may result in serious medical risk, up to and including death, or upon the discretion of your surgeon and anesthesiologist your surgery may need to be rescheduled.    __X__ 1. Do not eat food or drink liquids after midnight. No gum chewing or hard candies.     __X__ 2. No Alcohol for 24 hours before or after surgery.   ____ 3. Bring all medications with you on the day of surgery if instructed.    __X__ 4. Notify your doctor if there is any change in your medical condition     (cold, fever, infections).     Do not wear jewelry, make-up, hairpins, clips or nail polish.  Do not wear lotions, powders, or perfumes. .  Do not shave 48 hours prior to surgery. Men may shave face and neck.  Do not bring valuables to the hospital.    [facility] is not responsible for any belongings or valuables.               Contacts, dentures or bridgework may not be worn into surgery.  Leave your suitcase in the car. After surgery it may be brought to your room.  For patients admitted to the hospital, discharge time is determined by your                treatment team.   Patients discharged the day of surgery will not be allowed to drive home.   Please read over the following fact sheets that you were given:   MRSA Information and Surgical Site Infection Prevention   __X__ Take these medicines the morning of surgery with A SIP OF WATER:    1. OMEPRAZOLE AT BEDTIME THE NIGHT BEFORE AND THE MORNING OF SURGERY  2. OXYCODONE IF NEEDED  3.   4.  5.  6.  ____ Fleet Enema (as directed)   __X__ Use CHG Soap as directed  ____ Use inhalers on the day of surgery  ____ Stop metformin 2 days prior to surgery    ____ Take 1/2 of usual insulin dose the  night before surgery and none on the morning of surgery.   ____ Stop Coumadin/Plavix/aspirin on   ____ Stop Anti-inflammatories on    ____ Stop supplements until after surgery.    ____ Bring C-Pap to the hospital.

## 2016-02-23 NOTE — Telephone Encounter (Signed)
Called Cody Andrade is specials due to Dr. Mike Gip has sent message to schedulers to have pt come and get labs the morning of his bx but he has to be there at 8 am for 9 am procedure. The clinic is not open earlier enough for pt to come to cancer center. When I spoke to Dr. Mike Gip  She would prefer that pt get labs done the morning of procedure but due to the patient having to be at the hospital at 8 am the clinic is not open soon enough to get labs at cancer center.  When I spoke to [place] she states that they will draw them the morning of his procedure. I asked her if I need to enter the PT, PTT, and cbc for that date of 3/27 and she said no. She would enter what labs the radiologist requires in order to perform the bx. Dr. Mike Gip informed that it is taken care of.

## 2016-02-24 ENCOUNTER — Emergency Department
Admission: EM | Admit: 2016-02-24 | Discharge: 2016-02-24 | Disposition: A | Discharge status: Home / Self Care | Payer: Medicare Other | Attending: Emergency Medicine | Admitting: Emergency Medicine

## 2016-02-24 ENCOUNTER — Emergency Department: Payer: Medicare Other

## 2016-02-24 ENCOUNTER — Encounter: Payer: Self-pay | Admitting: Emergency Medicine

## 2016-02-24 DIAGNOSIS — Z87891 Personal history of nicotine dependence: Secondary | ICD-10-CM | POA: Insufficient documentation

## 2016-02-24 DIAGNOSIS — Z8507 Personal history of malignant neoplasm of pancreas: Secondary | ICD-10-CM | POA: Diagnosis not present

## 2016-02-24 DIAGNOSIS — Z8673 Personal history of transient ischemic attack (TIA), and cerebral infarction without residual deficits: Secondary | ICD-10-CM | POA: Diagnosis not present

## 2016-02-24 DIAGNOSIS — R600 Localized edema: Secondary | ICD-10-CM | POA: Insufficient documentation

## 2016-02-24 DIAGNOSIS — Z79899 Other long term (current) drug therapy: Secondary | ICD-10-CM | POA: Insufficient documentation

## 2016-02-24 DIAGNOSIS — I10 Essential (primary) hypertension: Secondary | ICD-10-CM | POA: Diagnosis not present

## 2016-02-24 DIAGNOSIS — R2242 Localized swelling, mass and lump, left lower limb: Secondary | ICD-10-CM | POA: Diagnosis present

## 2016-02-24 LAB — CBC WITH DIFFERENTIAL/PLATELET
BASOS ABS: 0.1 10*3/uL (ref 0–0.1)
Basophils Relative: 1 %
EOS ABS: 0.1 10*3/uL (ref 0–0.7)
EOS PCT: 1 %
HCT: 32.3 % — ABNORMAL LOW (ref 40.0–52.0)
HEMOGLOBIN: 10.8 g/dL — AB (ref 13.0–18.0)
LYMPHS PCT: 4 %
Lymphs Abs: 0.5 10*3/uL — ABNORMAL LOW (ref 1.0–3.6)
MCH: 30.7 pg (ref 26.0–34.0)
MCHC: 33.5 g/dL (ref 32.0–36.0)
MCV: 91.5 fL (ref 80.0–100.0)
Monocytes Absolute: 1.1 10*3/uL — ABNORMAL HIGH (ref 0.2–1.0)
Monocytes Relative: 10 %
NEUTROS PCT: 84 %
Neutro Abs: 9.4 10*3/uL — ABNORMAL HIGH (ref 1.4–6.5)
PLATELETS: 134 10*3/uL — AB (ref 150–440)
RBC: 3.53 MIL/uL — AB (ref 4.40–5.90)
RDW: 14.2 % (ref 11.5–14.5)
WBC: 11.2 10*3/uL — AB (ref 3.8–10.6)

## 2016-02-24 LAB — COMPREHENSIVE METABOLIC PANEL
ALT: 64 U/L — AB (ref 17–63)
AST: 54 U/L — AB (ref 15–41)
Albumin: 3.2 g/dL — ABNORMAL LOW (ref 3.5–5.0)
Alkaline Phosphatase: 395 U/L — ABNORMAL HIGH (ref 38–126)
Anion gap: 6 (ref 5–15)
BUN: 25 mg/dL — AB (ref 6–20)
CHLORIDE: 101 mmol/L (ref 101–111)
CO2: 28 mmol/L (ref 22–32)
CREATININE: 1.25 mg/dL — AB (ref 0.61–1.24)
Calcium: 8.4 mg/dL — ABNORMAL LOW (ref 8.9–10.3)
GFR calc Af Amer: >60 mL/min (ref >60)
GFR calc non Af Amer: 53 mL/min — ABNORMAL LOW (ref >60)
GLUCOSE: 154 mg/dL — AB (ref 65–99)
Potassium: 4 mmol/L (ref 3.5–5.1)
SODIUM: 135 mmol/L (ref 135–145)
Total Bilirubin: 1.9 mg/dL — ABNORMAL HIGH (ref 0.3–1.2)
Total Protein: 6.2 g/dL — ABNORMAL LOW (ref 6.5–8.1)

## 2016-02-24 NOTE — Discharge Instructions (Signed)

## 2016-02-24 NOTE — ED Notes (Signed)
Left leg swelling x 3 days, denies injury.

## 2016-02-24 NOTE — ED Provider Notes (Addendum)
[facility] Emergency Department Provider Note     Time seen: ----------------------------------------- 4:02 PM on 02/24/2016 -----------------------------------------    I have reviewed the triage vital signs and the nursing notes.   HISTORY  Chief Complaint Leg Swelling    HPI Cody Andrade is a 82 y.o. male who presents to ER for leg swelling.Patient states the leg swelling started worsening over the last 24 hours. Patient denies significant pain, denies recent illness or recent changes in his medicines. Patient does have a history of leg swelling he states intermittently, uncertain etiology.   Past Medical History  Diagnosis Date  . TIA (transient ischemic attack)   . Hypertension   . Diverticulitis   . Cancer ([place])     pancreatic ca  . GERD (gastroesophageal reflux disease)   . History of hiatal hernia   . History of colonic polyps     Patient Active Problem List   Diagnosis Date Noted  . Malignant neoplasm of head of pancreas ([place]) 02/23/2016  . Thrombocytopenia ([place]) 02/19/2016  . Pancreatic mass 02/15/2016  . Pancreatic cancer ([place]) 02/15/2016  . Protein-calorie malnutrition, severe 02/09/2016  . Neoplasm of digestive system   . Acquired hyperbilirubinemia   . Obstruction of bile duct   . Obstructive jaundice 02/08/2016  . Liver lesion 02/08/2016    Past Surgical History  Procedure Laterality Date  . Hernia repair    . Ercp N/A 02/09/2016    Procedure: ENDOSCOPIC RETROGRADE CHOLANGIOPANCREATOGRAPHY (ERCP);  Surgeon: Lucilla Lame, MD;  Location: [facility] ENDOSCOPY;  Service: Endoscopy;  Laterality: N/A;  . Skin cancer excision      x 3  . Colonoscopy    . Esophagogastroduodenoscopy      Allergies Lodine  Social History Social History  Substance Use Topics  . Smoking status: Former Smoker -- 25 years    Quit date: 12/09/1974  . Smokeless tobacco: Never Used  . Alcohol Use: 0.0 oz/week    0 Standard drinks or equivalent  per week    Review of Systems Constitutional: Negative for fever. Eyes: Negative for visual changes. ENT: Negative for sore throat. Cardiovascular: Negative for chest pain. Respiratory: Negative for shortness of breath. Gastrointestinal: Negative for abdominal pain, vomiting and diarrhea. Genitourinary: Negative for dysuria. Musculoskeletal: Positive for left leg swelling Skin: Negative for rash. Neurological: Negative for headaches, focal weakness or numbness.  10-point ROS otherwise negative.  ____________________________________________   PHYSICAL EXAM:  VITAL SIGNS: ED Triage Vitals  Enc Vitals Group     BP 02/24/16 1421 132/72 mmHg     Pulse Rate 02/24/16 1421 103     Resp 02/24/16 1421 20     Temp 02/24/16 1421 97.5 F (36.4 C)     Temp Source 02/24/16 1421 Oral     SpO2 02/24/16 1421 98 %     Weight 02/24/16 1421 170 lb (77.111 kg)     Height 02/24/16 1421 5\' 11"  (1.803 m)     Head Cir --      Peak Flow --      Pain Score 02/24/16 1422 6     Pain Loc --      Pain Edu? --      Excl. in [place]? --     Constitutional: Alert and oriented. Well appearing and in no distress. Eyes: Conjunctivae are normal. PERRL. Normal extraocular movements. Cardiovascular: Normal rate, regular rhythm. Normal and symmetric distal pulses are present in all extremities. No murmurs, rubs, or gallops. Respiratory: Normal respiratory effort without tachypnea nor  retractions. Breath sounds are clear and equal bilaterally. No wheezes/rales/rhonchi. Musculoskeletal: Significant edema of the left lower extremity compared to the right. Normal pulses distally. Neurologic:  Normal speech and language. No gross focal neurologic deficits are appreciated.  Skin:  Skin is warm, dry and intact. Mild erythema noted around the left ankle Psychiatric: Mood and affect are normal.  ____________________________________________  ED COURSE:  Pertinent labs & imaging results that were available during my  care of the patient were reviewed by me and considered in my medical decision making (see chart for details). Patient with unexplained edema of the left lower extremity, will obtain ultrasound and reevaluate. ____________________________________________    LABS (pertinent positives/negatives)  Labs Reviewed  CBC WITH DIFFERENTIAL/PLATELET - Abnormal; Notable for the following:    WBC 11.2 (*)    RBC 3.53 (*)    Hemoglobin 10.8 (*)    HCT 32.3 (*)    Platelets 134 (*)    Neutro Abs 9.4 (*)    Lymphs Abs 0.5 (*)    Monocytes Absolute 1.1 (*)    All other components within normal limits  COMPREHENSIVE METABOLIC PANEL - Abnormal; Notable for the following:    Glucose, Bld 154 (*)    BUN 25 (*)    Creatinine, Ser 1.25 (*)    Calcium 8.4 (*)    Total Protein 6.2 (*)    Albumin 3.2 (*)    AST 54 (*)    ALT 64 (*)    Alkaline Phosphatase 395 (*)    Total Bilirubin 1.9 (*)    GFR calc non Af Amer 53 (*)    All other components within normal limits    RADIOLOGY Images were viewed by me   IMPRESSION: No evidence of deep venous thrombosis.  ____________________________________________  FINAL ASSESSMENT AND PLAN  Lower extremity edema  Plan: Patient with labs and imaging as dictated above. Patient clinically does have swelling, uncertain etiology. No signs of cellulitis. We'll advise compression stockings and follow up with his doctor for reevaluation.   Earleen Newport, MD   Earleen Newport, MD 02/24/16 [place], MD 02/24/16 [phone]

## 2016-02-24 NOTE — ED Notes (Addendum)
IV placed in left ac per request of pt, stated that was his best vein.

## 2016-02-26 ENCOUNTER — Encounter: Payer: Self-pay | Admitting: Hematology and Oncology

## 2016-02-26 LAB — HEPARIN INDUCED PLATELET AB (HIT ANTIBODY): Heparin Induced Plt Ab: 0.262 OD (ref 0.000–0.400)

## 2016-02-26 LAB — HEPATITIS B CORE ANTIBODY, TOTAL: Hep B Core Total Ab: NEGATIVE

## 2016-02-26 LAB — HEPATITIS C ANTIBODY: HCV Ab: <0.1 s/co ratio (ref 0.0–0.9)

## 2016-02-26 LAB — SEROTONIN RELEASE ASSAY (SRA)
SRA .2 IU/mL UFH Ser-aCnc: 1 % (ref 0–20)
SRA 100IU/mL UFH Ser-aCnc: 1 % (ref 0–20)

## 2016-02-26 LAB — ANA W/REFLEX: Anti Nuclear Antibody(ANA): NEGATIVE

## 2016-02-27 ENCOUNTER — Ambulatory Visit: Payer: Medicare Other

## 2016-02-27 ENCOUNTER — Ambulatory Visit: Payer: Medicare Other | Admitting: Anesthesiology

## 2016-02-27 ENCOUNTER — Encounter
Admission: RE | Disposition: A | Discharge status: Home / Self Care | Payer: Self-pay | Source: Ambulatory Visit | Attending: General Surgery

## 2016-02-27 ENCOUNTER — Ambulatory Visit
Admission: RE | Admit: 2016-02-27 | Discharge: 2016-02-27 | Disposition: A | Discharge status: Home / Self Care | Payer: Medicare Other | Source: Ambulatory Visit | Attending: General Surgery | Admitting: General Surgery

## 2016-02-27 DIAGNOSIS — Z87891 Personal history of nicotine dependence: Secondary | ICD-10-CM | POA: Insufficient documentation

## 2016-02-27 DIAGNOSIS — Z803 Family history of malignant neoplasm of breast: Secondary | ICD-10-CM | POA: Insufficient documentation

## 2016-02-27 DIAGNOSIS — Z79899 Other long term (current) drug therapy: Secondary | ICD-10-CM | POA: Diagnosis not present

## 2016-02-27 DIAGNOSIS — Z452 Encounter for adjustment and management of vascular access device: Secondary | ICD-10-CM | POA: Insufficient documentation

## 2016-02-27 DIAGNOSIS — Z8673 Personal history of transient ischemic attack (TIA), and cerebral infarction without residual deficits: Secondary | ICD-10-CM | POA: Diagnosis not present

## 2016-02-27 DIAGNOSIS — Z95828 Presence of other vascular implants and grafts: Secondary | ICD-10-CM

## 2016-02-27 DIAGNOSIS — Z9889 Other specified postprocedural states: Secondary | ICD-10-CM | POA: Insufficient documentation

## 2016-02-27 DIAGNOSIS — Z79891 Long term (current) use of opiate analgesic: Secondary | ICD-10-CM | POA: Insufficient documentation

## 2016-02-27 DIAGNOSIS — I10 Essential (primary) hypertension: Secondary | ICD-10-CM | POA: Insufficient documentation

## 2016-02-27 DIAGNOSIS — C259 Malignant neoplasm of pancreas, unspecified: Secondary | ICD-10-CM | POA: Insufficient documentation

## 2016-02-27 DIAGNOSIS — Z808 Family history of malignant neoplasm of other organs or systems: Secondary | ICD-10-CM | POA: Insufficient documentation

## 2016-02-27 DIAGNOSIS — Z801 Family history of malignant neoplasm of trachea, bronchus and lung: Secondary | ICD-10-CM | POA: Insufficient documentation

## 2016-02-27 DIAGNOSIS — D696 Thrombocytopenia, unspecified: Secondary | ICD-10-CM | POA: Diagnosis not present

## 2016-02-27 HISTORY — PX: PORTACATH PLACEMENT: SHX2246

## 2016-02-27 SURGERY — INSERTION, TUNNELED CENTRAL VENOUS DEVICE, WITH PORT
Anesthesia: General | Laterality: Left | Wound class: Clean

## 2016-02-27 MED ORDER — SODIUM CHLORIDE 0.9 % IV SOLN
INTRAVENOUS | Status: DC | PRN
  Administered 2016-02-27: 9 mL via INTRAMUSCULAR

## 2016-02-27 MED ORDER — LIDOCAINE HCL (CARDIAC) 20 MG/ML IV SOLN
INTRAVENOUS | Status: DC | PRN
Start: 2016-02-27 — End: 2016-02-27
  Administered 2016-02-27: 60 mg via INTRATRACHEAL

## 2016-02-27 MED ORDER — FENTANYL CITRATE (PF) 100 MCG/2ML IJ SOLN: 25.0000 ug | INTRAMUSCULAR | Status: DC | PRN

## 2016-02-27 MED ORDER — CEFAZOLIN SODIUM-DEXTROSE 2-3 GM-% IV SOLR
2.0000 g | INTRAVENOUS | Status: AC
  Administered 2016-02-27: 2 g via INTRAVENOUS

## 2016-02-27 MED ORDER — CHLORHEXIDINE GLUCONATE 4 % EX LIQD: 1.0000 "application " | Freq: Once | CUTANEOUS | Status: DC

## 2016-02-27 MED ORDER — MIDAZOLAM HCL 2 MG/2ML IJ SOLN
INTRAMUSCULAR | Status: DC | PRN
  Administered 2016-02-27: 1 mg via INTRAVENOUS

## 2016-02-27 MED ORDER — LACTATED RINGERS IV SOLN
INTRAVENOUS | Status: DC
  Administered 2016-02-27: 11:00:00 via INTRAVENOUS

## 2016-02-27 MED ORDER — ONDANSETRON HCL 4 MG/2ML IJ SOLN
INTRAMUSCULAR | Status: DC | PRN
  Administered 2016-02-27: 4 mg via INTRAVENOUS

## 2016-02-27 MED ORDER — BUPIVACAINE HCL (PF) 0.5 % IJ SOLN
INTRAMUSCULAR | Status: AC
  Filled 2016-02-27: qty 30

## 2016-02-27 MED ORDER — ACETAMINOPHEN 10 MG/ML IV SOLN
INTRAVENOUS | Status: AC
  Filled 2016-02-27: qty 100

## 2016-02-27 MED ORDER — PROPOFOL 10 MG/ML IV BOLUS
INTRAVENOUS | Status: DC | PRN
  Administered 2016-02-27: 40 mg via INTRAVENOUS

## 2016-02-27 MED ORDER — LIDOCAINE HCL (PF) 1 % IJ SOLN
INTRAMUSCULAR | Status: AC
  Filled 2016-02-27: qty 30

## 2016-02-27 MED ORDER — PROPOFOL 500 MG/50ML IV EMUL
INTRAVENOUS | Status: DC | PRN
  Administered 2016-02-27: 100 ug/kg/min via INTRAVENOUS

## 2016-02-27 MED ORDER — FENTANYL CITRATE (PF) 100 MCG/2ML IJ SOLN
INTRAMUSCULAR | Status: DC | PRN
  Administered 2016-02-27: 25 ug via INTRAVENOUS

## 2016-02-27 MED ORDER — SODIUM CHLORIDE 0.9 % IV SOLN: INTRAVENOUS | Status: DC

## 2016-02-27 MED ORDER — HEPARIN SODIUM (PORCINE) 5000 UNIT/ML IJ SOLN
INTRAMUSCULAR | Status: AC
  Filled 2016-02-27: qty 1

## 2016-02-27 MED ORDER — LIDOCAINE HCL 1 % IJ SOLN
INTRAMUSCULAR | Status: DC | PRN
  Administered 2016-02-27: 14 mL via INTRAMUSCULAR

## 2016-02-27 MED ORDER — ACETAMINOPHEN 10 MG/ML IV SOLN
INTRAVENOUS | Status: DC | PRN
  Administered 2016-02-27: 1000 mg via INTRAVENOUS

## 2016-02-27 MED ORDER — CEFAZOLIN SODIUM-DEXTROSE 2-3 GM-% IV SOLR
INTRAVENOUS | Status: AC
  Administered 2016-02-27: 2 g via INTRAVENOUS
  Filled 2016-02-27: qty 50

## 2016-02-27 MED ORDER — ONDANSETRON HCL 4 MG/2ML IJ SOLN: 4.0000 mg | Freq: Once | INTRAMUSCULAR | Status: DC | PRN

## 2016-02-27 MED ORDER — TRAMADOL HCL 50 MG PO TABS: 50.0000 mg | ORAL_TABLET | Freq: Four times a day (QID) | ORAL | Status: DC | PRN

## 2016-02-27 SURGICAL SUPPLY — 29 items
BAG DECANTER FOR FLEXI CONT (MISCELLANEOUS) ×3 IMPLANT
BLADE CLIPPER SURG (BLADE) ×3 IMPLANT
BLADE SURG 15 STRL SS SAFETY (BLADE) ×3 IMPLANT
CANISTER SUCT 1200ML W/VALVE (MISCELLANEOUS) ×3 IMPLANT
CHLORAPREP W/TINT 26ML (MISCELLANEOUS) ×3 IMPLANT
COVER LIGHT HANDLE STERIS (MISCELLANEOUS) ×6 IMPLANT
DECANTER SPIKE VIAL GLASS SM (MISCELLANEOUS) IMPLANT
DRAPE C-ARM XRAY 36X54 (DRAPES) ×3 IMPLANT
ELECT REM PT RETURN 9FT ADLT (ELECTROSURGICAL) ×3
ELECTRODE REM PT RTRN 9FT ADLT (ELECTROSURGICAL) ×1 IMPLANT
GLOVE BIO SURGEON STRL SZ7 (GLOVE) ×18 IMPLANT
GOWN STRL REUS W/ TWL LRG LVL3 (GOWN DISPOSABLE) ×2 IMPLANT
GOWN STRL REUS W/TWL LRG LVL3 (GOWN DISPOSABLE) ×4
IV NS 500ML (IV SOLUTION) ×2
IV NS 500ML BAXH (IV SOLUTION) ×1 IMPLANT
KIT PORT POWER 8FR ISP CVUE (Catheter) ×3 IMPLANT
KIT RM TURNOVER STRD PROC AR (KITS) ×3 IMPLANT
LABEL OR SOLS (LABEL) ×3 IMPLANT
LIQUID BAND (GAUZE/BANDAGES/DRESSINGS) ×3 IMPLANT
NEEDLE FILTER BLUNT 18X 1/2SAF (NEEDLE) ×2
NEEDLE FILTER BLUNT 18X1 1/2 (NEEDLE) ×1 IMPLANT
NEEDLE HYPO 25GX1X1/2 BEV (NEEDLE) ×3 IMPLANT
NS IRRIG 500ML POUR BTL (IV SOLUTION) ×3 IMPLANT
PACK PORT-A-CATH (MISCELLANEOUS) ×3 IMPLANT
SUT PROLENE 2 0 SH DA (SUTURE) ×3 IMPLANT
SUT VIC AB 3-0 SH 27 (SUTURE) ×2
SUT VIC AB 3-0 SH 27X BRD (SUTURE) ×1 IMPLANT
SUT VIC AB 4-0 FS2 27 (SUTURE) ×3 IMPLANT
SYR 3ML LL SCALE MARK (SYRINGE) ×3 IMPLANT

## 2016-02-27 NOTE — Discharge Instructions (Signed)
AMBULATORY SURGERY  ?DISCHARGE INSTRUCTIONS ? ? ?The drugs that you were given will stay in your system until tomorrow so for the next 24 hours you should not: ? ?Drive an automobile ?Make any legal decisions ?Drink any alcoholic beverage ? ? ?You may resume regular meals tomorrow.  Today it is better to start with liquids and gradually work up to solid foods. ? ?You may eat anything you prefer, but it is better to start with liquids, then soup and crackers, and gradually work up to solid foods. ? ? ?Please notify your doctor immediately if you have any unusual bleeding, trouble breathing, redness and pain at the surgery site, drainage, fever, or pain not relieved by medication. ? ? ? ?Additional Instructions: ? ? ? ?Please contact your physician with any problems or Same Day Surgery at [phone], Monday through Friday 6 am to 4 pm, or [place] [facility] number at [phone].  ?

## 2016-02-27 NOTE — Progress Notes (Signed)
CXR done

## 2016-02-27 NOTE — Op Note (Signed)
Preop diagnosis: Carcinoma pancreas  Post op diagnosis: Same  Operation: Insertion venous access port  Surgeon: S.G.Nurse, children's:     Anesthesia: Monitored anesthesia care  Complications: None  EBL: Less than 20 mL  Drains: None  Description: Patient was placed the supine position the operating table. The left upper chest and neck area were prepped and draped as sterile field after adequate sedation and monitoring. Timeout was performed. Ultrasound probe was brought up to the field in the subclavian vein identified beneath the lateral end of the clavicle. Patient was positioned in Trendelenburg at this point to allow for filling of the subclavian vein. Local anesthetic a half percent Marcaine mixed with 1% Xylocaine was instilled and a small 1 cm incision was made. Using ultrasound guidance the needle was successfully positioned in the subclavian vein and a guidewire positioned. Following this the Seldinger technique was used and the catheter was positioned going towards the distal superior vena cava skin mark being proximally 27 cm. Fluoroscopy was used for this portion of the procedure. This port side was selected and the second costal cartilage area and interspace. After instillation of local anesthetic skin incision was made and subcutaneous pocket created with cautery. Catheter was tunneled through to the side and cut to approximate length and then fixed to the prefilled port. The port was placed in the pocket and anchored with 3 stitches of 2-0 Prolene. The catheter was smoothed out at this initial puncture site beneath the clavicle and fluoroscopy appeared to ensure a smooth curve of the catheter. It also noted be lying in this distal SVC. The port was flushed through with heparinized saline. Port side was closed with 3-0 Vicryl the subcutaneous tissue and the skin incision both closed with subcuticular 4-0 Vicryl. Liqui ban was applied. Patient subsequently returned recovery room  stable condition

## 2016-02-27 NOTE — Transfer of Care (Signed)
Immediate Anesthesia Transfer of Care Note  Patient: Cody Andrade  Procedure(s) Performed: Procedure(s): INSERTION PORT-A-CATH (Left)  Patient Location: PACU  Anesthesia Type:General  Level of Consciousness: awake, alert , oriented and patient cooperative  Airway & Oxygen Therapy: Patient Spontanous Breathing and Patient connected to face mask oxygen  Post-op Assessment: Report given to RN, Post -op Vital signs reviewed and stable and Patient moving all extremities X 4  Post vital signs: Reviewed and stable  Last Vitals:  Filed Vitals:   02/27/16 1022  BP: 141/84  Pulse: 114  Temp: 36.8 C  Resp: 16    Complications: No apparent anesthesia complications

## 2016-02-27 NOTE — Interval H&P Note (Signed)
History and Physical Interval Note:  02/27/2016 10:48 AM  Cody Andrade  has presented today for surgery, with the diagnosis of cancer pancreas  The various methods of treatment have been discussed with the patient and family. After consideration of risks, benefits and other options for treatment, the patient has consented to  Procedure(s): INSERTION PORT-A-CATH (N/A) as a surgical intervention .  The patient's history has been reviewed, patient examined, no change in status, stable for surgery.  I have reviewed the patient's chart and labs.  Questions were answered to the patient's satisfaction.     SANKAR,SEEPLAPUTHUR G

## 2016-02-27 NOTE — H&P (View-Only) (Signed)
Patient ID: Cody Andrade, male   DOB: [birth year], 82 y.o.   MRN: [id number]  Chief Complaint  Patient presents with  . Other    port placement    HPI Cody Andrade is a 82 y.o. male.  Here today to discuss having a port placed for chemotherapy for metastatic pancreatic cancer. A true biopsy has not been completed, platelets are low. He is here today with his daughter, Cody Andrade. Initial symptoms were abdominal pain and jaundice. PET scan 02/15/2016  He underwent ERCP with stent on 02/09/2016 by Dr. Lucilla Lame. I have reviewed the history of present illness with the patient. HPI  Past Medical History  Diagnosis Date  . TIA (transient ischemic attack)   . Hypertension   . Diverticulitis   . Cancer [facility])     pancreatic ca    Past Surgical History  Procedure Laterality Date  . Hernia repair    . Ercp N/A 02/09/2016    Procedure: ENDOSCOPIC RETROGRADE CHOLANGIOPANCREATOGRAPHY (ERCP);  Surgeon: Lucilla Lame, MD;  Location: [facility] ENDOSCOPY;  Service: Endoscopy;  Laterality: N/A;    Family History  Problem Relation Age of Onset  . Cancer Father 18    lung  . Breast cancer Maternal Aunt   . Brain cancer Maternal Aunt     Social History Social History  Substance Use Topics  . Smoking status: Former Smoker -- 25 years    Quit date: 12/09/1974  . Smokeless tobacco: Never Used  . Alcohol Use: 0.0 oz/week    0 Standard drinks or equivalent per week    Allergies  Allergen Reactions  . Lodine [Etodolac] Nausea And Vomiting    Current Outpatient Prescriptions  Medication Sig Dispense Refill  . gabapentin (NEURONTIN) 600 MG tablet Take 1 tablet by mouth 3 (three) times daily.    Marland Kitchen levofloxacin (LEVAQUIN) 750 MG tablet Take 1 tablet (750 mg total) by mouth daily. 14 tablet 0  . lisinopril-hydrochlorothiazide (PRINZIDE,ZESTORETIC) 20-25 MG tablet Take 1 tablet by mouth daily.    Marland Kitchen omeprazole (PRILOSEC) 20 MG capsule Take 1 capsule by mouth daily.    . ondansetron (ZOFRAN-ODT) 4  MG disintegrating tablet Take 1 tablet (4 mg total) by mouth every 6 (six) hours as needed for nausea. Reported on 02/14/2016 30 tablet 0  . oxyCODONE (OXY IR/ROXICODONE) 5 MG immediate release tablet Take 1 tablet (5 mg total) by mouth every 4 (four) hours as needed for moderate pain. 30 tablet 0  . simethicone (MYLICON) 80 MG chewable tablet Chew 80 mg by mouth every 6 (six) hours as needed for flatulence.    Marland Kitchen zolpidem (AMBIEN) 5 MG tablet Take 1 tablet (5 mg total) by mouth at bedtime as needed for sleep. 30 tablet 0   No current facility-administered medications for this visit.    Review of Systems Review of Systems  Constitutional: Negative.   Respiratory: Negative.   Cardiovascular: Negative.   Gastrointestinal: Positive for constipation. Negative for nausea and vomiting.    Blood pressure 140/82, pulse 88, resp. rate 12, height 5\' 11"  (1.803 m), weight 170 lb (77.111 kg).  Physical Exam Physical Exam  Constitutional: He is oriented to person, place, and time. He appears well-developed and well-nourished.  HENT:  Mouth/Throat: Oropharynx is clear and moist.  Eyes: Conjunctivae are normal. No scleral icterus.  Neck: Neck supple.  Cardiovascular: Normal rate, regular rhythm and normal heart sounds.   Pulmonary/Chest: Effort normal and breath sounds normal.  Abdominal: Soft. Normal appearance and bowel sounds are normal. There  is no hepatomegaly. There is no tenderness. No hernia.  Lymphadenopathy:    He has no cervical adenopathy.  Neurological: He is alert and oriented to person, place, and time.  Skin: Skin is warm and dry.  Psychiatric: His behavior is normal.    Data Reviewed Notes reviewed CA 19-9 is markedly elevted. Platelet count has benn trending down , was 70K 2 days ago Assessment    Suspected pancreatic cancer. Chemotherapy is currently scheduled and this visit is to discuss port placement. Discussed port placement in full with pt and he is agreeable.. Assuming  platelet count remains above 70K will proceed with port placement.     Plan    The risks associated with central venous access including arterial, pulmonary and venous injury were reviewed. The possible need for additional treatment if pulmonary injury occurs (chest tube placement) was discussed.      PCP:  Derinda Late This information has been scribed by Karie Fetch RNBC.    Remmy Riffe G 02/21/2016, 12:08 PM

## 2016-02-27 NOTE — Anesthesia Preprocedure Evaluation (Signed)
Anesthesia Evaluation  Patient identified by MRN, date of birth, ID band  Reviewed: Allergy & Precautions, NPO status   Airway Mallampati: II       Dental  (+) Teeth Intact   Pulmonary COPD, former smoker,     + decreased breath sounds      Cardiovascular Exercise Tolerance: Poor hypertension, Pt. on medications  Rhythm:Regular     Neuro/Psych TIA   GI/Hepatic Neg liver ROS, hiatal hernia, GERD  ,  Endo/Other  negative endocrine ROS  Renal/GU negative Renal ROS     Musculoskeletal   Abdominal (+) + obese,   Peds  Hematology negative hematology ROS (+)   Anesthesia Other Findings   Reproductive/Obstetrics                             Anesthesia Physical Anesthesia Plan  ASA: III  Anesthesia Plan: MAC   Post-op Pain Management:    Induction: Intravenous  Airway Management Planned: Natural Airway and Nasal Cannula  Additional Equipment:   Intra-op Plan:   Post-operative Plan:   Informed Consent: I have reviewed the patients History and Physical, chart, labs and discussed the procedure including the risks, benefits and alternatives for the proposed anesthesia with the patient or authorized representative who has indicated his/her understanding and acceptance.     Plan Discussed with: CRNA  Anesthesia Plan Comments:         Anesthesia Quick Evaluation

## 2016-02-28 ENCOUNTER — Encounter: Payer: Self-pay | Admitting: General Surgery

## 2016-02-28 NOTE — Anesthesia Postprocedure Evaluation (Signed)
Anesthesia Post Note  Patient: Cody Andrade  Procedure(s) Performed: Procedure(s) (LRB): INSERTION PORT-A-CATH (Left)  Patient location during evaluation: PACU Anesthesia Type: MAC Level of consciousness: awake Pain management: pain level controlled Vital Signs Assessment: post-procedure vital signs reviewed and stable Respiratory status: spontaneous breathing Cardiovascular status: stable Anesthetic complications: no    Last Vitals:  Filed Vitals:   02/27/16 1344 02/27/16 1355  BP: 128/65 130/64  Pulse: 73 71  Temp:    Resp: 16     Last Pain:  Filed Vitals:   02/28/16 0814  PainSc: 0-No pain                 VAN STAVEREN,Luwana Butrick

## 2016-03-01 ENCOUNTER — Other Ambulatory Visit: Payer: Self-pay | Admitting: Radiology

## 2016-03-04 ENCOUNTER — Ambulatory Visit
Admission: RE | Admit: 2016-03-04 | Discharge: 2016-03-04 | Disposition: A | Discharge status: Home / Self Care | Payer: Medicare Other | Source: Ambulatory Visit | Attending: Hematology and Oncology | Admitting: Hematology and Oncology

## 2016-03-04 DIAGNOSIS — K7689 Other specified diseases of liver: Secondary | ICD-10-CM | POA: Insufficient documentation

## 2016-03-04 DIAGNOSIS — I10 Essential (primary) hypertension: Secondary | ICD-10-CM | POA: Diagnosis not present

## 2016-03-04 DIAGNOSIS — Z86010 Personal history of colonic polyps: Secondary | ICD-10-CM | POA: Diagnosis not present

## 2016-03-04 DIAGNOSIS — Z801 Family history of malignant neoplasm of trachea, bronchus and lung: Secondary | ICD-10-CM | POA: Insufficient documentation

## 2016-03-04 DIAGNOSIS — Z8673 Personal history of transient ischemic attack (TIA), and cerebral infarction without residual deficits: Secondary | ICD-10-CM | POA: Insufficient documentation

## 2016-03-04 DIAGNOSIS — R109 Unspecified abdominal pain: Secondary | ICD-10-CM | POA: Insufficient documentation

## 2016-03-04 DIAGNOSIS — Z808 Family history of malignant neoplasm of other organs or systems: Secondary | ICD-10-CM | POA: Diagnosis not present

## 2016-03-04 DIAGNOSIS — Z79899 Other long term (current) drug therapy: Secondary | ICD-10-CM | POA: Diagnosis not present

## 2016-03-04 DIAGNOSIS — Z87891 Personal history of nicotine dependence: Secondary | ICD-10-CM | POA: Insufficient documentation

## 2016-03-04 DIAGNOSIS — Z888 Allergy status to other drugs, medicaments and biological substances status: Secondary | ICD-10-CM | POA: Diagnosis not present

## 2016-03-04 DIAGNOSIS — K219 Gastro-esophageal reflux disease without esophagitis: Secondary | ICD-10-CM | POA: Diagnosis not present

## 2016-03-04 DIAGNOSIS — Z803 Family history of malignant neoplasm of breast: Secondary | ICD-10-CM | POA: Diagnosis not present

## 2016-03-04 DIAGNOSIS — Z9889 Other specified postprocedural states: Secondary | ICD-10-CM | POA: Insufficient documentation

## 2016-03-04 DIAGNOSIS — K769 Liver disease, unspecified: Secondary | ICD-10-CM

## 2016-03-04 LAB — APTT: aPTT: 29 seconds (ref 24–36)

## 2016-03-04 LAB — CBC
HEMATOCRIT: 29.3 % — AB (ref 40.0–52.0)
HEMOGLOBIN: 9.8 g/dL — AB (ref 13.0–18.0)
MCH: 30.7 pg (ref 26.0–34.0)
MCHC: 33.3 g/dL (ref 32.0–36.0)
MCV: 92 fL (ref 80.0–100.0)
Platelets: 107 10*3/uL — ABNORMAL LOW (ref 150–440)
RBC: 3.18 MIL/uL — AB (ref 4.40–5.90)
RDW: 14.3 % (ref 11.5–14.5)
WBC: 8 10*3/uL (ref 3.8–10.6)

## 2016-03-04 LAB — PROTIME-INR
INR: 1.11
Prothrombin Time: 14.5 seconds (ref 11.4–15.0)

## 2016-03-04 MED ORDER — MIDAZOLAM HCL 2 MG/2ML IJ SOLN
INTRAMUSCULAR | Status: AC | PRN
  Administered 2016-03-04 (×2): 1 mg via INTRAVENOUS

## 2016-03-04 MED ORDER — SODIUM CHLORIDE 0.9 % IV SOLN
Freq: Once | INTRAVENOUS | Status: AC
  Administered 2016-03-04: 09:00:00 via INTRAVENOUS

## 2016-03-04 MED ORDER — FENTANYL CITRATE (PF) 100 MCG/2ML IJ SOLN
INTRAMUSCULAR | Status: AC | PRN
  Administered 2016-03-04: 50 ug via INTRAVENOUS
  Administered 2016-03-04: 25 ug via INTRAVENOUS

## 2016-03-04 NOTE — Procedures (Signed)
US guided core biopsies of left supraclavicular lymph node.  3 cores obtained.  Minimal blood loss and no immediate complication.

## 2016-03-04 NOTE — H&P (Signed)
Chief Complaint: Patient was seen in consultation today for ultrasound guided biopsy at the request of [place]  Referring Physician(s): Corcoran,Melissa C  History of Present Illness: Cody Andrade is a 82 y.o. male with a pancreatic mass, h/o biliary obstruction and widespread metastatic disease on PET.  Patient needs a tissue diagnosis prior to starting treatment.  Patient had obstructive jaundice and had a metallic biliary stent placed.  Patient's jaundice resolved after stent placement and had some fevers following stent placement.  Currently, patient is afebrile.  He complains of left leg swelling (acute on chronic) and abdominal pain (3 out 10 on pain scale).  He lost weight in past but now regaining weight.   Past Medical History  Diagnosis Date  . TIA (transient ischemic attack)   . Hypertension   . Diverticulitis   . Cancer ([place])     pancreatic ca  . GERD (gastroesophageal reflux disease)   . History of hiatal hernia   . History of colonic polyps     Past Surgical History  Procedure Laterality Date  . Hernia repair    . Ercp N/A 02/09/2016    Procedure: ENDOSCOPIC RETROGRADE CHOLANGIOPANCREATOGRAPHY (ERCP);  Surgeon: Lucilla Lame, MD;  Location: [facility] ENDOSCOPY;  Service: Endoscopy;  Laterality: N/A;  . Skin cancer excision      x 3  . Colonoscopy    . Esophagogastroduodenoscopy    . Portacath placement Left 02/27/2016    Procedure: INSERTION PORT-A-CATH;  Surgeon: Christene Lye, MD;  Location: [facility] ORS;  Service: General;  Laterality: Left;    Allergies: Lodine  Medications: Prior to Admission medications   Medication Sig Start Date End Date Taking? Authorizing Provider  gabapentin (NEURONTIN) 600 MG tablet Take 1 tablet by mouth 3 (three) times daily. Reported on 02/23/2016   Yes Historical Provider, MD  lidocaine-prilocaine (EMLA) cream Apply 1 application topically as needed. Apply 1/2 tablespoon 1-2 hours prior port being used and cover  02/22/16  Yes Lequita Asal, MD  magnesium hydroxide (MILK OF MAGNESIA) 400 MG/5ML suspension Take 30 mLs by mouth daily as needed for mild constipation.   Yes Historical Provider, MD  omeprazole (PRILOSEC) 20 MG capsule Take 1 capsule by mouth daily. Reported on 02/23/2016   Yes Historical Provider, MD  ondansetron (ZOFRAN-ODT) 4 MG disintegrating tablet Take 1 tablet (4 mg total) by mouth every 6 (six) hours as needed for nausea. Reported on 02/14/2016 02/16/16  Yes Lequita Asal, MD  oxyCODONE (OXY IR/ROXICODONE) 5 MG immediate release tablet Take 1 tablet (5 mg total) by mouth every 4 (four) hours as needed for moderate pain. 02/10/16  Yes Fritzi Mandes, MD  Polyethylene Glycol 3350 (MIRALAX PO) Take 1 Dose by mouth daily.   Yes Historical Provider, MD  simethicone (MYLICON) 80 MG chewable tablet Chew 80 mg by mouth every 6 (six) hours as needed for flatulence.   Yes Historical Provider, MD  traMADol (ULTRAM) 50 MG tablet Take 1 tablet (50 mg total) by mouth every 6 (six) hours as needed. 02/27/16  Yes Seeplaputhur Robinette Haines, MD  zolpidem (AMBIEN) 5 MG tablet Take 1 tablet (5 mg total) by mouth at bedtime as needed for sleep. 02/10/16  Yes Fritzi Mandes, MD  levofloxacin (LEVAQUIN) 750 MG tablet Take 750 mg by mouth daily. Reported on 03/04/2016    Historical Provider, MD  lisinopril-hydrochlorothiazide (PRINZIDE,ZESTORETIC) 20-25 MG tablet Take 1 tablet by mouth daily. Reported on 03/04/2016 01/03/16   Historical Provider, MD     Family  History  Problem Relation Age of Onset  . Cancer Father 32    lung  . Breast cancer Maternal Aunt   . Brain cancer Maternal Aunt     Social History   Social History  . Marital Status: Legally Separated    Spouse Name: N/A  . Number of Children: N/A  . Years of Education: N/A   Social History Main Topics  . Smoking status: Former Smoker -- 25 years    Quit date: 12/09/1974  . Smokeless tobacco: Never Used  . Alcohol Use: 0.0 oz/week    0 Standard drinks or  equivalent per week  . Drug Use: No  . Sexual Activity: Not Asked   Other Topics Concern  . None   Social History Narrative        Review of Systems  Constitutional: Negative for fever, chills and appetite change.  Respiratory: Negative.   Cardiovascular: Positive for leg swelling.       Left ankle and foot swelling.  Gastrointestinal: Positive for abdominal pain and constipation.  Genitourinary: Positive for frequency.    Vital Signs: BP 155/85 mmHg  Temp(Src) 98.2 F (36.8 C) (Oral)  Resp 18  SpO2 98%  Physical Exam  Cardiovascular: Normal rate, regular rhythm, normal heart sounds and intact distal pulses.   Pulmonary/Chest: Effort normal and breath sounds normal.  Abdominal: Soft. Bowel sounds are normal. He exhibits no distension. There is no tenderness.  Musculoskeletal:  Swelling in left ankle and foot.  No discoloration.  No ulcers.  Palpable DP pulses.    Mallampati Score:  MD Evaluation Airway: WNL Heart: WNL Abdomen: WNL Chest/ Lungs: WNL ASA  Classification: 2 Mallampati/Airway Score: One  Imaging: Dg Chest 2 View  02/14/2016  CLINICAL DATA:  Fever EXAM: CHEST  2 VIEW COMPARISON:  None. FINDINGS: The heart size and mediastinal contours are within normal limits. Both lungs are clear. The visualized skeletal structures are unremarkable. IMPRESSION: No active cardiopulmonary disease. Electronically Signed   By: Franchot Gallo M.D.   On: 02/14/2016 22:12   Ct Abdomen Pelvis W Contrast  02/14/2016  CLINICAL DATA:  Recent hospital discharge for urinary tract infection and acute renal injury. Likely pancreatic cancer with liver metastasis. Patient is scheduled for PET scan tomorrow. Fever. Recent ERCP. EXAM: CT ABDOMEN AND PELVIS WITH CONTRAST TECHNIQUE: Multidetector CT imaging of the abdomen and pelvis was performed using the standard protocol following bolus administration of intravenous contrast. CONTRAST:  145mL OMNIPAQUE IOHEXOL 300 MG/ML  SOLN COMPARISON:   Ultrasound abdomen 02/08/2016 FINDINGS: Emphysematous changes in the lungs. 12 mm nodule in the left lung base. This could represent a metastatic focus and correlation with the upcoming PET scan is suggested. Additional nodule in the left lower lung measuring 4 mm diameter. Multiple hypodense peripherally enhancing liver lesions throughout all segments consistent with metastatic disease. Probably the largest lesion is in segment 6 inferiorly and measures about 2.2 cm diameter. Mild diffuse gallbladder wall thickening is nonspecific. No stones identified. Biliary stent is in place. Pneumobilia is likely related to stent placement. There is a mass in the junction of the head and body of the pancreas measuring 2.3 x 3.2 cm. The tail and body of the pancreas are atrophic with mild pancreatic ductal dilatation. The mass demonstrates local infiltration around the celiac axis and peripancreatic fat. Appearance is consistent with adenocarcinoma. The spleen, adrenal glands, kidneys, abdominal aorta, inferior vena cava, and retroperitoneal lymph nodes are unremarkable. Stomach, small bowel, and colon are not abnormally distended. No free  air or free fluid in the abdomen. Abdominal wall musculature appears intact. Pelvis: Appendix is not identified. Diverticulosis of the sigmoid colon without evidence of diverticulitis. Wall thickening probably due to muscular hypertrophy. Prostate gland is enlarged at 5.3 cm diameter. Bladder wall is not thickened. No free or loculated pelvic fluid collections. No pelvic mass or lymphadenopathy. Degenerative changes in the lumbar spine. No destructive bone lesions. IMPRESSION: Mass at the head/ body of pancreas with pancreatic atrophy and ductal dilatation. This is consistent with adenocarcinoma. There is local infiltration into the peripancreatic fat and celiac axis. Multiple hepatic metastases. Indeterminate nodules in the left lung base may also represent metastatic disease. Biliary stent  with associated pneumobilia. Electronically Signed   By: Lucienne Capers M.D.   On: 02/14/2016 22:48   Nm Pet Image Initial (pi) Skull Base To Thigh  02/15/2016  CLINICAL DATA:  Initial Treatment strategy for pancreas adenocarcinoma. EXAM: NUCLEAR MEDICINE PET SKULL BASE TO THIGH TECHNIQUE: 13.75 mCi F-18 FDG was injected intravenously. Full-ring PET imaging was performed from the skull base to thigh after the radiotracer. CT data was obtained and used for attenuation correction and anatomic localization. FASTING BLOOD GLUCOSE:  Value: 109 mg/dl COMPARISON:  02/14/2016 FINDINGS: NECK Hypermetabolic left supraclavicular lymph node has an SUV max equal to 5.0, image 60 of series 3. CHEST Enlarged multi nodular thyroid gland identified. Multiple hypermetabolic mediastinal lymph nodes are identified. Hypermetabolic left paratracheal lymph node measures 1 cm and has an SUV max equal to 5.2. Hypermetabolic right paratracheal lymph node measures 8 mm and has an SUV max equal to 3.96. Mild changes of centrilobular emphysema. Multiple small pulmonary nodules are scattered throughout both lungs. Most these are too small to characterize by PET-CT but are worrisome for pulmonary metastasis. ABDOMEN/PELVIS Multiple liver metastases are hypermetabolic. Index lesion within the anterior right lobe of liver measures 1.9 cm and has an SUV max equal to 5.4. Posterior right hepatic lobe lesion measures 1.9 cm, image 163 of series 3. This has an SUV max equal to 4.6. There is a metallic stent identified within the common bile duct. Pneumobilia is present compatible with biliary patency. The mass involving the body of pancreas is again noted and appears intensely hypermetabolic. This measures approximately 2.9 cm and has an SUV max equal to 6.16, image number 159 of series 3. 9 mm aortocaval lymph node is identified, image 159 of series 3. This has an SUV max equal to 3.4. SKELETON No focal hypermetabolic activity to suggest skeletal  metastasis. IMPRESSION: 1. Mass in body of pancreas is intensely hypermetabolic compatible with primary adenocarcinoma of the pancreas. 2. Multifocal hypermetabolic liver metastases. 3. Hypermetabolic mediastinal and left supraclavicular lymph nodes compatible with metastatic adenopathy. 4. Numerous small pulmonary nodules are identified. These are too small to characterize by PET-CT but are suspicious for metastatic disease. Electronically Signed   By: Kerby Moors M.D.   On: 02/15/2016 13:36   US Venous Img Lower Unilateral Left  02/24/2016  CLINICAL DATA:  Left leg edema for 1 day. EXAM: LEFT LOWER EXTREMITY VENOUS DOPPLER ULTRASOUND TECHNIQUE: Gray-scale sonography with graded compression, as well as color Doppler and duplex ultrasound were performed to evaluate the lower extremity deep venous systems from the level of the common femoral vein and including the common femoral, femoral, profunda femoral, popliteal and calf veins including the posterior tibial, peroneal and gastrocnemius veins when visible. The superficial great saphenous vein was also interrogated. Spectral Doppler was utilized to evaluate flow at rest and with distal  augmentation maneuvers in the common femoral, femoral and popliteal veins. COMPARISON:  None. FINDINGS: Contralateral Common Femoral Vein: Respiratory phasicity is normal and symmetric with the symptomatic side. No evidence of thrombus. Normal compressibility. Common Femoral Vein: No evidence of thrombus. Normal compressibility, respiratory phasicity and response to augmentation. Saphenofemoral Junction: No evidence of thrombus. Normal compressibility and flow on color Doppler imaging. Profunda Femoral Vein: No evidence of thrombus. Normal compressibility and flow on color Doppler imaging. Femoral Vein: No evidence of thrombus. Normal compressibility, respiratory phasicity and response to augmentation. Popliteal Vein: No evidence of thrombus. Normal compressibility, respiratory  phasicity and response to augmentation. Calf Veins: No evidence of thrombus. Normal compressibility and flow on color Doppler imaging. Superficial Great Saphenous Vein: No evidence of thrombus. Normal compressibility and flow on color Doppler imaging. Venous Reflux:  None. Other Findings:  None. IMPRESSION: No evidence of deep venous thrombosis. Electronically Signed   By: Claudie Revering M.D.   On: 02/24/2016 16:03   Dg Chest Port 1 View  02/27/2016  CLINICAL DATA:  Status post port insertion EXAM: PORTABLE CHEST 1 VIEW COMPARISON:  02/14/2016 FINDINGS: New left chest wall port is noted with the catheter tip at the cavoatrial junction. No pneumothorax is seen. The lungs are clear bilaterally. The cardiac shadow is within normal limits. No bony abnormality is seen. IMPRESSION: No pneumothorax following port placement. Electronically Signed   By: Inez Catalina M.D.   On: 02/27/2016 12:58   Dg C-arm 1-60 Min-no Report  02/27/2016  CLINICAL DATA: port a cath insertion C-ARM 1-60 MINUTES Fluoroscopy was utilized by the requesting physician.  No radiographic interpretation.   US Abdomen Limited Ruq  02/08/2016  CLINICAL DATA:  Abdominal pain and elevated LFTs for several weeks EXAM: US ABDOMEN LIMITED - RIGHT UPPER QUADRANT COMPARISON:  None. FINDINGS: Gallbladder: Well distended with echogenic material floating within the bile consistent with gallbladder sludge and likely small stones. No wall thickening or pericholecystic fluid is noted. Common bile duct: Diameter: Dilated to 11.7 mm. Liver: Multiple hypoechoic lesions are identified within the liver. These are suspicious for underlying metastatic disease. IMPRESSION: Biliary ductal dilatation as well as changes suggestive of hepatic metastatic disease. This may be related to an underlying pancreatic lesion. CT of the abdomen and pelvis with contrast (both oral and IV) is recommended. Electronically Signed   By: Inez Catalina M.D.   On: 02/08/2016 20:12     Labs:  CBC:  Recent Labs  02/19/16 1511 02/22/16 1242 02/24/16 1432 03/04/16 0913  WBC 6.5 10.7* 11.2* 8.0  HGB 11.1* 11.0* 10.8* 9.8*  HCT 32.9* 32.7* 32.3* 29.3*  PLT 70* 104* 134* 107*    COAGS:  Recent Labs  02/08/16 1558 02/19/16 1006 02/19/16 1511  INR 1.05 1.09  --   APTT 26  --  32    BMP:  Recent Labs  02/10/16 0428 02/14/16 1758 02/19/16 1511 02/24/16 1432  NA 140 133* 132* 135  K 3.7 4.2 3.4* 4.0  CL 110 99* 99* 101  CO2 25 26 27 28   GLUCOSE 85 126* 130* 154*  BUN 44* 25* 24* 25*  CALCIUM 8.2* 8.6* 8.1* 8.4*  CREATININE 1.41* 1.23 1.15 1.25*  GFRNONAA 45* 54* 58* 53*  GFRAA 53* >60 >60 >60    LIVER FUNCTION TESTS:  Recent Labs  02/10/16 0428 02/14/16 1758 02/22/16 1242 02/24/16 1432  BILITOT 5.3* 2.4* 1.9* 1.9*  AST 117* 74* 51* 54*  ALT 303* 191* 77* 64*  ALKPHOS 253* 310* 404* 395*  PROT 5.2* 6.1* 6.4* 6.2*  ALBUMIN 2.9* 3.3* 3.3* 3.2*    TUMOR MARKERS:  Recent Labs  02/10/16 0430 02/19/16 1006  CA199 [id number]* [id number]*    Assessment and Plan:  82 yo with a pancreatic mass and evidence for metastatic disease.  Imaging findings are suggestive for metastatic pancreatic cancer and patient needs a tissue diagnosis.  There are PET positive lesions in liver and supraclavicular lymph nodes.  Will evaluate both areas for biopsy.   Prefer to biopsy supraclavicular area if amendable due to lower risk of bleeding and less discomfort to patient after the biopsy.  Waiting on today's labs.  Plan for US guided biopsy with moderate sedation.     Electronically Signed: Carylon Perches 03/04/2016, 9:27 AM

## 2016-03-04 NOTE — Discharge Instructions (Signed)
Needle Biopsy, Care After °Refer to this sheet in the next few weeks. These instructions provide you with information about caring for yourself after your procedure. Your health care provider may also give you more specific instructions. Your treatment has been planned according to current medical practices, but problems sometimes occur. Call your health care provider if you have any problems or questions after your procedure. °WHAT TO EXPECT AFTER THE PROCEDURE °After your procedure, it is common to have soreness, bruising, or mild pain at the biopsy site. This should go away in a few days. °HOME CARE INSTRUCTIONS °· Rest as directed by your health care provider. °· Take medicines only as directed by your health care provider. °· There are many different ways to close and cover the biopsy site, including stitches (sutures), skin glue, and adhesive strips. Follow your health care provider's instructions about: °¨ Biopsy site care. °¨ Bandage (dressing) changes and removal. °¨ Biopsy site closure removal. °· Check your biopsy site every day for signs of infection. Watch for: °¨ Redness, swelling, or pain. °¨ Fluid, blood, or pus. °SEEK MEDICAL CARE IF: °· You have a fever. °· You have redness, swelling, or pain at the biopsy site that lasts longer than a few days. °· You have fluid, blood, or pus coming from the biopsy site. °· You feel nauseous. °· You vomit. °SEEK IMMEDIATE MEDICAL CARE IF: °· You have shortness of breath. °· You have trouble breathing. °· You have chest pain.   °· You feel dizzy or you faint. °· You have bleeding that does not stop with pressure or a bandage. °· You cough up blood. °· You have pain in your abdomen. °  °This information is not intended to replace advice given to you by your health care provider. Make sure you discuss any questions you have with your health care provider. °  °Document Released: 04/11/2015 Document Reviewed: 04/11/2015 °[employer]vier Interactive Patient Education ©2016  [employer] ° °

## 2016-03-05 LAB — SURGICAL PATHOLOGY

## 2016-03-07 ENCOUNTER — Inpatient Hospital Stay: Payer: Medicare Other

## 2016-03-07 ENCOUNTER — Inpatient Hospital Stay (HOSPITAL_BASED_OUTPATIENT_CLINIC_OR_DEPARTMENT_OTHER): Payer: Medicare Other | Admitting: Hematology and Oncology

## 2016-03-07 ENCOUNTER — Telehealth: Payer: Self-pay | Admitting: Pharmacist

## 2016-03-07 VITALS — BP 129/92 | HR 109 | Temp 95.7°F | Resp 18 | Ht 71.0 in | Wt 160.2 lb

## 2016-03-07 VITALS — BP 138/78 | HR 83 | Resp 18

## 2016-03-07 DIAGNOSIS — C259 Malignant neoplasm of pancreas, unspecified: Secondary | ICD-10-CM | POA: Diagnosis not present

## 2016-03-07 DIAGNOSIS — K219 Gastro-esophageal reflux disease without esophagitis: Secondary | ICD-10-CM

## 2016-03-07 DIAGNOSIS — C250 Malignant neoplasm of head of pancreas: Secondary | ICD-10-CM

## 2016-03-07 DIAGNOSIS — E43 Unspecified severe protein-calorie malnutrition: Secondary | ICD-10-CM

## 2016-03-07 DIAGNOSIS — C787 Secondary malignant neoplasm of liver and intrahepatic bile duct: Secondary | ICD-10-CM | POA: Diagnosis not present

## 2016-03-07 DIAGNOSIS — D649 Anemia, unspecified: Secondary | ICD-10-CM

## 2016-03-07 DIAGNOSIS — R634 Abnormal weight loss: Secondary | ICD-10-CM

## 2016-03-07 DIAGNOSIS — D696 Thrombocytopenia, unspecified: Secondary | ICD-10-CM

## 2016-03-07 DIAGNOSIS — R918 Other nonspecific abnormal finding of lung field: Secondary | ICD-10-CM

## 2016-03-07 DIAGNOSIS — R978 Other abnormal tumor markers: Secondary | ICD-10-CM

## 2016-03-07 DIAGNOSIS — C779 Secondary and unspecified malignant neoplasm of lymph node, unspecified: Secondary | ICD-10-CM

## 2016-03-07 DIAGNOSIS — R5383 Other fatigue: Secondary | ICD-10-CM

## 2016-03-07 DIAGNOSIS — G4700 Insomnia, unspecified: Secondary | ICD-10-CM | POA: Insufficient documentation

## 2016-03-07 DIAGNOSIS — G893 Neoplasm related pain (acute) (chronic): Secondary | ICD-10-CM

## 2016-03-07 DIAGNOSIS — Z801 Family history of malignant neoplasm of trachea, bronchus and lung: Secondary | ICD-10-CM

## 2016-03-07 DIAGNOSIS — Z8673 Personal history of transient ischemic attack (TIA), and cerebral infarction without residual deficits: Secondary | ICD-10-CM

## 2016-03-07 DIAGNOSIS — Z808 Family history of malignant neoplasm of other organs or systems: Secondary | ICD-10-CM

## 2016-03-07 DIAGNOSIS — Z79899 Other long term (current) drug therapy: Secondary | ICD-10-CM

## 2016-03-07 DIAGNOSIS — Z87891 Personal history of nicotine dependence: Secondary | ICD-10-CM

## 2016-03-07 DIAGNOSIS — I10 Essential (primary) hypertension: Secondary | ICD-10-CM

## 2016-03-07 DIAGNOSIS — K831 Obstruction of bile duct: Secondary | ICD-10-CM

## 2016-03-07 LAB — COMPREHENSIVE METABOLIC PANEL
ALT: 33 U/L (ref 17–63)
AST: 35 U/L (ref 15–41)
Albumin: 3.3 g/dL — ABNORMAL LOW (ref 3.5–5.0)
Alkaline Phosphatase: 475 U/L — ABNORMAL HIGH (ref 38–126)
Anion gap: 9 (ref 5–15)
BUN: 25 mg/dL — ABNORMAL HIGH (ref 6–20)
CO2: 24 mmol/L (ref 22–32)
Calcium: 8.2 mg/dL — ABNORMAL LOW (ref 8.9–10.3)
Chloride: 98 mmol/L — ABNORMAL LOW (ref 101–111)
Creatinine, Ser: 1.24 mg/dL (ref 0.61–1.24)
GFR calc Af Amer: >60 mL/min (ref >60)
GFR calc non Af Amer: 53 mL/min — ABNORMAL LOW (ref >60)
Glucose, Bld: 224 mg/dL — ABNORMAL HIGH (ref 65–99)
Potassium: 4.1 mmol/L (ref 3.5–5.1)
Sodium: 131 mmol/L — ABNORMAL LOW (ref 135–145)
Total Bilirubin: 1.8 mg/dL — ABNORMAL HIGH (ref 0.3–1.2)
Total Protein: 6.3 g/dL — ABNORMAL LOW (ref 6.5–8.1)

## 2016-03-07 LAB — CBC WITH DIFFERENTIAL/PLATELET
Basophils Absolute: 0.1 10*3/uL (ref 0–0.1)
Basophils Relative: 1 %
Eosinophils Absolute: 0.1 10*3/uL (ref 0–0.7)
Eosinophils Relative: 1 %
HCT: 30.5 % — ABNORMAL LOW (ref 40.0–52.0)
Hemoglobin: 10.2 g/dL — ABNORMAL LOW (ref 13.0–18.0)
Lymphocytes Relative: 3 %
Lymphs Abs: 0.4 10*3/uL — ABNORMAL LOW (ref 1.0–3.6)
MCH: 31 pg (ref 26.0–34.0)
MCHC: 33.6 g/dL (ref 32.0–36.0)
MCV: 92.1 fL (ref 80.0–100.0)
Monocytes Absolute: 0.7 10*3/uL (ref 0.2–1.0)
Monocytes Relative: 6 %
Neutro Abs: 9.9 10*3/uL — ABNORMAL HIGH (ref 1.4–6.5)
Neutrophils Relative %: 89 %
Platelets: 124 10*3/uL — ABNORMAL LOW (ref 150–440)
RBC: 3.31 MIL/uL — ABNORMAL LOW (ref 4.40–5.90)
RDW: 14.1 % (ref 11.5–14.5)
WBC: 11.1 10*3/uL — ABNORMAL HIGH (ref 3.8–10.6)

## 2016-03-07 LAB — MAGNESIUM: Magnesium: 2.2 mg/dL (ref 1.7–2.4)

## 2016-03-07 MED ORDER — SODIUM CHLORIDE 0.9 % IV SOLN
Freq: Once | INTRAVENOUS | Status: AC
  Administered 2016-03-07: 11:00:00 via INTRAVENOUS
  Filled 2016-03-07: qty 4

## 2016-03-07 MED ORDER — SODIUM CHLORIDE 0.9 % IV SOLN
800.0000 mg/m2 | Freq: Once | INTRAVENOUS | Status: AC
  Administered 2016-03-07: 1558 mg via INTRAVENOUS
  Filled 2016-03-07: qty 35.72

## 2016-03-07 MED ORDER — OXYCODONE HCL 5 MG PO TABS: 5.0000 mg | ORAL_TABLET | ORAL | Status: DC | PRN

## 2016-03-07 MED ORDER — SODIUM CHLORIDE 0.9 % IV SOLN
Freq: Once | INTRAVENOUS | Status: AC
  Administered 2016-03-07: 11:00:00 via INTRAVENOUS
  Filled 2016-03-07: qty 1000

## 2016-03-07 MED ORDER — MEGESTROL ACETATE 400 MG/10ML PO SUSP: 200.0000 mg | Freq: Every day | ORAL | Status: AC

## 2016-03-07 MED ORDER — HEPARIN SOD (PORK) LOCK FLUSH 100 UNIT/ML IV SOLN
500.0000 [IU] | Freq: Once | INTRAVENOUS | Status: AC
  Administered 2016-03-07: 500 [IU] via INTRAVENOUS
  Filled 2016-03-07: qty 5

## 2016-03-07 MED ORDER — ZOLPIDEM TARTRATE 5 MG PO TABS: 5.0000 mg | ORAL_TABLET | Freq: Every evening | ORAL | Status: AC | PRN

## 2016-03-07 MED ORDER — SODIUM CHLORIDE 0.9% FLUSH
10.0000 mL | INTRAVENOUS | Status: DC | PRN
  Filled 2016-03-07: qty 10

## 2016-03-07 MED ORDER — ONDANSETRON 4 MG PO TBDP: 4.0000 mg | ORAL_TABLET | Freq: Four times a day (QID) | ORAL | Status: AC | PRN

## 2016-03-07 NOTE — Progress Notes (Signed)
[facility] day:  03/07/2016   Chief Complaint: Cody Andrade is a 82 y.o. male with probable metastatic pancreatic cancer who is seen for assessment prior to cycle #1 gemcitabine and Abraxane.  HPI:  The patient was last seen in the medical oncology clinic on 02/22/2016.  At that time, his liver biopsy was rescheduled.  Platelet count was 104,000.  HIT assay and serotonin release assay were negative.  He underwent left supraclavicular lymph node biopsy under ultrasound guidance on 03/04/2016.  Pathology revealed adenocarcinoma morphologically consistent with a pancreatobiliary origin.  Symptomatically, he has lost 10 pounds since his last visit.  He is taking oxycodone for pain.  Last bowel movement was 4 days ago.  He notes mild shortness of breath, but no pleuritic pain.  He has a constant pain across his abdomen.  Left lower extremity swelling prompted an ER visit on 02/24/2016.  Duplex revealed no evidence of DVT.  He notes extreme fatigue.   Past Medical History  Diagnosis Date  . TIA (transient ischemic attack)   . Hypertension   . Diverticulitis   . Cancer ([place])     pancreatic ca  . GERD (gastroesophageal reflux disease)   . History of hiatal hernia   . History of colonic polyps     Past Surgical History  Procedure Laterality Date  . Hernia repair    . Ercp N/A 02/09/2016    Procedure: ENDOSCOPIC RETROGRADE CHOLANGIOPANCREATOGRAPHY (ERCP);  Surgeon: Lucilla Lame, MD;  Location: [facility] ENDOSCOPY;  Service: Endoscopy;  Laterality: N/A;  . Skin cancer excision      x 3  . Colonoscopy    . Esophagogastroduodenoscopy    . Portacath placement Left 02/27/2016    Procedure: INSERTION PORT-A-CATH;  Surgeon: Christene Lye, MD;  Location: [facility] ORS;  Service: General;  Laterality: Left;    Family History  Problem Relation Age of Onset  . Cancer Father 32    lung  . Breast cancer Maternal Aunt   . Brain cancer Maternal Aunt      Social History:  reports that he quit smoking about 41 years ago. He has never used smokeless tobacco. He reports that he drinks alcohol. He reports that he does not use illicit drugs.  The patient is accompanied by his x-wife today.  Allergies:  Allergies  Allergen Reactions  . Lodine [Etodolac] Nausea And Vomiting    Current Medications: Current Outpatient Prescriptions  Medication Sig Dispense Refill  . furosemide (LASIX) 20 MG tablet Take by mouth.    . gabapentin (NEURONTIN) 600 MG tablet Take 1 tablet by mouth 3 (three) times daily. Reported on 02/23/2016    . lidocaine-prilocaine (EMLA) cream Apply 1 application topically as needed. Apply 1/2 tablespoon 1-2 hours prior port being used and cover 30 g 0  . magnesium hydroxide (MILK OF MAGNESIA) 400 MG/5ML suspension Take 30 mLs by mouth daily as needed for mild constipation.    Marland Kitchen omeprazole (PRILOSEC) 20 MG capsule Take 1 capsule by mouth daily. Reported on 02/23/2016    . oxyCODONE (OXY IR/ROXICODONE) 5 MG immediate release tablet Take 1 tablet (5 mg total) by mouth every 4 (four) hours as needed for moderate pain. 30 tablet 0  . Polyethylene Glycol 3350 (MIRALAX PO) Take 1 Dose by mouth daily.    . potassium chloride SA (K-DUR,KLOR-CON) 20 MEQ tablet Take by mouth.    . simethicone (MYLICON) 80 MG chewable tablet Chew 80 mg by mouth every 6 (six)  hours as needed for flatulence.    . traMADol (ULTRAM) 50 MG tablet Take 1 tablet (50 mg total) by mouth every 6 (six) hours as needed. 30 tablet 0  . zolpidem (AMBIEN) 5 MG tablet Take 1 tablet (5 mg total) by mouth at bedtime as needed for sleep. 30 tablet 0  . lisinopril-hydrochlorothiazide (PRINZIDE,ZESTORETIC) 20-25 MG tablet Take 1 tablet by mouth daily. Reported on 03/07/2016    . ondansetron (ZOFRAN-ODT) 4 MG disintegrating tablet Take 1 tablet (4 mg total) by mouth every 6 (six) hours as needed for nausea. Reported on 02/14/2016 (Patient not taking: Reported on 03/07/2016) 30 tablet 0    No current facility-administered medications for this visit.    Review of Systems:  GENERAL:  Extreme fatigue.  No fevers or sweats.  Weight loss of 10 pounds since last visit. PERFORMANCE STATUS (ECOG):  1-2 HEENT:  No visual changes, runny nose, sore throat, mouth sores or tenderness. Lungs: Shortness of breath with exertion.  No cough.  No hemoptysis. Cardiac:  No chest pain, palpitations, orthopnea, or PND. GI:  Poor appetite.  Little nausea.  Constipation (last bowel movement 4 days ago).  No vomiting, diarrhea, melena or hematochezia. GU:  No urgency, frequency, dysuria, or hematuria. Musculoskeletal:  Back pain.  No joint pain.  No muscle tenderness. Extremities:  No pain or swelling. Skin:  No rashes or skin changes. Neuro:  General weakness.  No headache, numbness or weakness, balance or coordination issues. Endocrine:  No diabetes, thyroid issues, hot flashes or night sweats. Psych:  No mood changes, depression or anxiety. Taking Ambien for sleep. Pain:  No focal pain. Review of systems:  All other systems reviewed and found to be negative.  Physical Exam: Blood pressure 129/92, pulse 109, temperature 95.7 F (35.4 C), temperature source Tympanic, resp. rate 18, height 5' 11"  (1.803 m), weight 160 lb 2.6 oz (72.65 kg). GENERAL:  Fatigued appearing gentleman sitting comfortably in the exam room in no acute distress. MENTAL STATUS:  Alert and oriented to person, place and time. HEAD:  Pearline Cables hair.  Normocephalic, atraumatic, face symmetric, no Cushingoid features. EYES:  Glasses.  Hazel eyes.  Faint scleral icterus.  Pupils equal round and reactive to light and accomodation.  No conjunctivitis. ENT:  Oropharynx clear without lesion.  Tongue normal. Mucous membranes moist.  RESPIRATORY:  Clear to auscultation without rales, wheezes or rhonchi. CARDIOVASCULAR:  Regular rate and rhythm without murmur, rub or gallop. ABDOMEN:  Soft, non-tender, with active bowel sounds, and no  hepatosplenomegaly.  No masses. SKIN:  No rashes, ulcers or lesions. EXTREMITIES:  Mild left lower extremity edema.  No skin discoloration or tenderness.  No palpable cords. LYMPH NODES:  No palpable cervical, supraclavicular, axillary or inguinal adenopathy  NEUROLOGICAL:  Unremarkable. PSYCH:  Appropriate.    Appointment on 03/07/2016  Component Date Value Ref Range Status  . WBC 03/07/2016 11.1* 3.8 - 10.6 K/uL Final  . RBC 03/07/2016 3.31* 4.40 - 5.90 MIL/uL Final  . Hemoglobin 03/07/2016 10.2* 13.0 - 18.0 g/dL Final  . HCT 03/07/2016 30.5* 40.0 - 52.0 % Final  . MCV 03/07/2016 92.1  80.0 - 100.0 fL Final  . MCH 03/07/2016 31.0  26.0 - 34.0 pg Final  . MCHC 03/07/2016 33.6  32.0 - 36.0 g/dL Final  . RDW 03/07/2016 14.1  11.5 - 14.5 % Final  . Platelets 03/07/2016 124* 150 - 440 K/uL Final  . Neutrophils Relative % 03/07/2016 89   Final  . Neutro Abs 03/07/2016 9.9* 1.4 -  6.5 K/uL Final  . Lymphocytes Relative 03/07/2016 3   Final  . Lymphs Abs 03/07/2016 0.4* 1.0 - 3.6 K/uL Final  . Monocytes Relative 03/07/2016 6   Final  . Monocytes Absolute 03/07/2016 0.7  0.2 - 1.0 K/uL Final  . Eosinophils Relative 03/07/2016 1   Final  . Eosinophils Absolute 03/07/2016 0.1  0 - 0.7 K/uL Final  . Basophils Relative 03/07/2016 1   Final  . Basophils Absolute 03/07/2016 0.1  0 - 0.1 K/uL Final  . Sodium 03/07/2016 131* 135 - 145 mmol/L Final  . Potassium 03/07/2016 4.1  3.5 - 5.1 mmol/L Final  . Chloride 03/07/2016 98* 101 - 111 mmol/L Final  . CO2 03/07/2016 24  22 - 32 mmol/L Final  . Glucose, Bld 03/07/2016 224* 65 - 99 mg/dL Final  . BUN 03/07/2016 25* 6 - 20 mg/dL Final  . Creatinine, Ser 03/07/2016 1.24  0.61 - 1.24 mg/dL Final  . Calcium 03/07/2016 8.2* 8.9 - 10.3 mg/dL Final  . Total Protein 03/07/2016 6.3* 6.5 - 8.1 g/dL Final  . Albumin 03/07/2016 3.3* 3.5 - 5.0 g/dL Final  . AST 03/07/2016 35  15 - 41 U/L Final  . ALT 03/07/2016 33  17 - 63 U/L Final  . Alkaline Phosphatase  03/07/2016 475* 38 - 126 U/L Final  . Total Bilirubin 03/07/2016 1.8* 0.3 - 1.2 mg/dL Final  . GFR calc non Af Amer 03/07/2016 53* >60 mL/min Final  . GFR calc Af Amer 03/07/2016 >60  >60 mL/min Final   Comment: (NOTE) The eGFR has been calculated using the CKD EPI equation. This calculation has not been validated in all clinical situations. eGFR's persistently <60 mL/min signify possible Chronic Kidney Disease.   . Anion gap 03/07/2016 9  5 - 15 Final  . Magnesium 03/07/2016 2.2  1.7 - 2.4 mg/dL Final    Assessment:  Cody Andrade is a 82 y.o. male with metastatic pancreatic cancer.  He presented on 02/08/2016 with fatigue, 20 pound weight loss, abdominal discomfort and jaundice (bilirubin 9.3). ERCP on 02/09/2016 revealed segmental biliary stricture. The upper third of the main bile duct was dilated. A biliary sphincterotomy was performed and a metal stent placed.   Left supraclavicular lymph node biopsy on 03/04/2016 revealed adenocarcinoma morphologically consistent with a pancreatobiliary origin.  CA19-9 is elevated:  83,355 on 02/10/2016, 227,038 on 02/19/2016, and 353,416 on 03/07/2016.  PET scan on 02/15/2016 revealed a mass in body of pancreas which was intensely hypermetabolic compatible with primary adenocarcinoma of the pancreas.  There were multifocal hypermetabolic liver metastases.  There was hypermetabolic mediastinal and left supraclavicular lymph nodes compatible with metastatic adenopathy.  There were numerous small pulmonary nodules, too small to characterize by PET-CT but suspicious for metastatic disease.  He presented to the emergency room on 02/14/2016 with fever.  Abdomen and pelvic CT scan on 02/14/2016 revealed a 2.3 x 3.2 cm mass at the head/ body of pancreas with pancreatic atrophy and ductal dilatation consistent with adenocarcinoma. There was local infiltration into the peripancreatic fat and celiac axis.  There were multiple hepatic metastases. There were  indeterminate nodules in the left lung base.  There was a biliary stent with associated pneumobilia.  Cultures were negative.  He received Rocephin and was discharged on Levaquin.  He developed thrombocytopenia (platelet count 77,0000) on 02/19/2016.  Work-up reveals the following normal labs:  PTT, fibrinogen, creatinine, ANA, hepatitis B surface antigen, hepatitis B core total antibody, and hepatitis C antibody.  Hepatin induced thrombocytopenia (  HIT) testing and serotonin release assay were negative.  Platelet count in a blue top tube ruled out pseudo-thrombocytopenia.   Left lower extremity duplex on 02/24/2016 revealed no evidence of DVT.  Symptomatically, he is fatigued.  He continues to lose weight.  He has chronic upper abdominal pain.  Exam is stable.  Plan: 1.  Review supraclavicular lymph node biopsy and confirmation of metastatic pancreatic cancer.  Discuss chemotherapy plan (gemcitabine and Abraxane weekly x 2-3 with 1 week off).  Discuss liver function tests.  Discuss plan for gemcitabine alone with reassessment in 1 week.  Patient consented to treatment. 2.  Discuss importance of caloric intake.  Discuss appetite stimulant.  Discuss steroids and Megace.  Side effects reviewed.  3.  Begin week #1 gemcitabine 80% dosing. 4.  Rx:  oxycodone 5 mg po q 4 ours prn pain. 5.  Discuss keeping a pain dairy and switch to long acting pain medications based on pain medication usage. 6.  Work-up of anemia:  ferritin, iron studies, B12, folate, TSH. 7.  Rx:  Megace 200 mg po q day.  Dis:  150 cc. 8.  Rx:  Ambien 5 mg po qhs prn insomnia. 9.  Rx:  ondansetron 4 mg SL q 6 hours prn nausea. 10.  RTC in 1 week for MD assessment, labs (CBC with diff, CMP), and week #2 gemcitabine +/- Abraxane.   Lequita Asal, MD  03/07/2016

## 2016-03-07 NOTE — Telephone Encounter (Signed)
Call from Moishe Spice, RN working with Dr. Mike Gip. Patient will not get Abraxane today, Gemzar only. Dose of Gemzar reduced from 1000mg /m2 to 800mg /m2. Addition of Zofran and deletion of compazine.

## 2016-03-07 NOTE — Progress Notes (Signed)
Pt reports he is still not taking lisinopril and doesn't know if her should start back, more constipation and constipation abdominal pressure, extreme fatigue and sob on exertion.

## 2016-03-08 ENCOUNTER — Telehealth: Payer: Self-pay

## 2016-03-08 ENCOUNTER — Inpatient Hospital Stay: Payer: Medicare Other

## 2016-03-08 ENCOUNTER — Other Ambulatory Visit: Payer: Self-pay | Admitting: *Deleted

## 2016-03-08 ENCOUNTER — Other Ambulatory Visit: Payer: Self-pay | Admitting: Hematology and Oncology

## 2016-03-08 ENCOUNTER — Telehealth: Payer: Self-pay | Admitting: *Deleted

## 2016-03-08 DIAGNOSIS — D649 Anemia, unspecified: Secondary | ICD-10-CM

## 2016-03-08 DIAGNOSIS — R7989 Other specified abnormal findings of blood chemistry: Secondary | ICD-10-CM

## 2016-03-08 DIAGNOSIS — C250 Malignant neoplasm of head of pancreas: Secondary | ICD-10-CM

## 2016-03-08 DIAGNOSIS — E0590 Thyrotoxicosis, unspecified without thyrotoxic crisis or storm: Secondary | ICD-10-CM | POA: Insufficient documentation

## 2016-03-08 DIAGNOSIS — C259 Malignant neoplasm of pancreas, unspecified: Secondary | ICD-10-CM | POA: Diagnosis not present

## 2016-03-08 LAB — IRON AND TIBC
Iron: 101 ug/dL (ref 45–182)
Saturation Ratios: 54 % — ABNORMAL HIGH (ref 17.9–39.5)
TIBC: 188 ug/dL — ABNORMAL LOW (ref 250–450)
UIBC: 87 ug/dL

## 2016-03-08 LAB — FERRITIN: Ferritin: 516 ng/mL — ABNORMAL HIGH (ref 24–336)

## 2016-03-08 LAB — VITAMIN B12: Vitamin B-12: 1695 pg/mL — ABNORMAL HIGH (ref 180–914)

## 2016-03-08 LAB — TSH: TSH: 0.129 u[IU]/mL — ABNORMAL LOW (ref 0.350–4.500)

## 2016-03-08 LAB — CA 19-9 (SERIAL): CA 19-9: 353416 U/mL — ABNORMAL HIGH (ref 0–35)

## 2016-03-08 LAB — FOLATE: Folate: 12.3 ng/mL (ref >5.9)

## 2016-03-08 MED ORDER — PREDNISONE 10 MG PO TABS: 5.0000 mg | ORAL_TABLET | Freq: Every day | ORAL | Status: DC

## 2016-03-08 NOTE — Telephone Encounter (Signed)
Called pt left VM to let pt know that his megace was denied and that we sent in an appeal and could possibly have to wait 7-10 business days for a reply and I would call as soon as I heard anything.

## 2016-03-08 NOTE — Telephone Encounter (Signed)
I have called and spoke to daughter [name] who called and brought list of IV nutrition for pt that he can get in [place]. And what is going on with megace.  i calle daughter back after speaking to Dr. Mike Gip. She states that she is not familiar with every ingredient and she can ask pharmacy but the one thing that stick out for her is the ascorbic acid which is vitamin c and according to the dose he would get it would be 2.5 gm in one dose and it in that amount is bad on his kidneys so for that alone she would not rec: the fluids above.  Also the megace was denied by pt's insurance and we did appeal it and faxed nack for the insurance consideration of whether they will over turn the denial.  I told daughter 2 choices paying oop or waiting to see if denial is overturned.  The other choice is to try prednisone 10 mg daily.  The daughter would like to speak to her Dad and let them decide. She will call pharmacist and check on price out of their pocket.  In between I have sent rx fo rprednisone to pharmacy and entered in comments. Pt may come get prednisone if they can't get megace.  Daughter understands above and she is also looking into sources to help her get megace a different route by through company who make it or good rx help.  She also asked about mint tea-i do not know but I can check but MD on phone and she states it will be a while. I can let her know when I know

## 2016-03-08 NOTE — Telephone Encounter (Signed)
Asking for Dr Mike Gip to look at paper her father is dropping off, she wants to take him to [facility] for a nutritional IV infusion and wants to be sure Dr Mike Gip is ok with that

## 2016-03-11 NOTE — Telephone Encounter (Signed)
Erroneous note encounter  

## 2016-03-12 ENCOUNTER — Ambulatory Visit: Payer: Medicare Other

## 2016-03-12 ENCOUNTER — Ambulatory Visit: Payer: Medicare Other | Admitting: Hematology and Oncology

## 2016-03-12 ENCOUNTER — Other Ambulatory Visit: Payer: Medicare Other

## 2016-03-13 ENCOUNTER — Encounter: Payer: Self-pay | Admitting: General Surgery

## 2016-03-13 ENCOUNTER — Ambulatory Visit (INDEPENDENT_AMBULATORY_CARE_PROVIDER_SITE_OTHER): Payer: Medicare Other | Admitting: General Surgery

## 2016-03-13 ENCOUNTER — Telehealth: Payer: Self-pay | Admitting: *Deleted

## 2016-03-13 VITALS — BP 138/76 | HR 90 | Resp 16 | Ht 71.0 in | Wt 158.0 lb

## 2016-03-13 DIAGNOSIS — C259 Malignant neoplasm of pancreas, unspecified: Secondary | ICD-10-CM

## 2016-03-13 NOTE — Patient Instructions (Signed)
Patient to return as needed. 

## 2016-03-13 NOTE — Telephone Encounter (Signed)
Fatigue got a little better for a few days but it is now worse than before. He is eating and drinking well. Would like to know what to do for him, please call

## 2016-03-13 NOTE — Progress Notes (Signed)
Here for postop visit, port placement on 02-27-16. He has started chemo and the port worked well. He states he just feels "tired". Treatment was on 03/07/16, next treatment is tomorrow.  I have reviewed the history of present illness with the patient.  Port site is clean and healing well. Lungs are clear. Patient to return as needed.    PCP:  Baldemar Lenis This information has been scribed by Karie Fetch RN, BSN,BC.

## 2016-03-13 NOTE — Telephone Encounter (Signed)
  Please call patient.  We see him tomorrow.  M

## 2016-03-13 NOTE — Telephone Encounter (Signed)
Advised that this will be discussed at appt tomorrow

## 2016-03-14 ENCOUNTER — Inpatient Hospital Stay: Payer: Medicare Other | Attending: Hematology and Oncology

## 2016-03-14 ENCOUNTER — Ambulatory Visit: Payer: Medicare Other | Admitting: Hematology and Oncology

## 2016-03-14 ENCOUNTER — Inpatient Hospital Stay: Payer: Medicare Other

## 2016-03-14 ENCOUNTER — Telehealth: Payer: Self-pay | Admitting: *Deleted

## 2016-03-14 ENCOUNTER — Other Ambulatory Visit: Payer: Medicare Other

## 2016-03-14 ENCOUNTER — Ambulatory Visit: Payer: Medicare Other

## 2016-03-14 ENCOUNTER — Inpatient Hospital Stay (HOSPITAL_BASED_OUTPATIENT_CLINIC_OR_DEPARTMENT_OTHER): Payer: Medicare Other | Admitting: Hematology and Oncology

## 2016-03-14 ENCOUNTER — Telehealth: Payer: Self-pay

## 2016-03-14 ENCOUNTER — Other Ambulatory Visit: Payer: Self-pay | Admitting: Hematology and Oncology

## 2016-03-14 VITALS — BP 157/79 | HR 118 | Temp 97.5°F | Resp 18 | Wt 159.0 lb

## 2016-03-14 DIAGNOSIS — Z87891 Personal history of nicotine dependence: Secondary | ICD-10-CM | POA: Diagnosis not present

## 2016-03-14 DIAGNOSIS — D696 Thrombocytopenia, unspecified: Secondary | ICD-10-CM

## 2016-03-14 DIAGNOSIS — R630 Anorexia: Secondary | ICD-10-CM | POA: Diagnosis not present

## 2016-03-14 DIAGNOSIS — C250 Malignant neoplasm of head of pancreas: Secondary | ICD-10-CM

## 2016-03-14 DIAGNOSIS — Z79899 Other long term (current) drug therapy: Secondary | ICD-10-CM | POA: Diagnosis not present

## 2016-03-14 DIAGNOSIS — Z86010 Personal history of colonic polyps: Secondary | ICD-10-CM

## 2016-03-14 DIAGNOSIS — K219 Gastro-esophageal reflux disease without esophagitis: Secondary | ICD-10-CM | POA: Insufficient documentation

## 2016-03-14 DIAGNOSIS — E43 Unspecified severe protein-calorie malnutrition: Secondary | ICD-10-CM

## 2016-03-14 DIAGNOSIS — C778 Secondary and unspecified malignant neoplasm of lymph nodes of multiple regions: Secondary | ICD-10-CM | POA: Diagnosis not present

## 2016-03-14 DIAGNOSIS — Z8673 Personal history of transient ischemic attack (TIA), and cerebral infarction without residual deficits: Secondary | ICD-10-CM | POA: Insufficient documentation

## 2016-03-14 DIAGNOSIS — Z9221 Personal history of antineoplastic chemotherapy: Secondary | ICD-10-CM

## 2016-03-14 DIAGNOSIS — C787 Secondary malignant neoplasm of liver and intrahepatic bile duct: Secondary | ICD-10-CM

## 2016-03-14 DIAGNOSIS — D649 Anemia, unspecified: Secondary | ICD-10-CM | POA: Insufficient documentation

## 2016-03-14 DIAGNOSIS — R0602 Shortness of breath: Secondary | ICD-10-CM | POA: Insufficient documentation

## 2016-03-14 DIAGNOSIS — Z801 Family history of malignant neoplasm of trachea, bronchus and lung: Secondary | ICD-10-CM

## 2016-03-14 DIAGNOSIS — C251 Malignant neoplasm of body of pancreas: Secondary | ICD-10-CM | POA: Insufficient documentation

## 2016-03-14 DIAGNOSIS — I82402 Acute embolism and thrombosis of unspecified deep veins of left lower extremity: Secondary | ICD-10-CM | POA: Diagnosis not present

## 2016-03-14 DIAGNOSIS — K5900 Constipation, unspecified: Secondary | ICD-10-CM | POA: Insufficient documentation

## 2016-03-14 DIAGNOSIS — Z5111 Encounter for antineoplastic chemotherapy: Secondary | ICD-10-CM | POA: Insufficient documentation

## 2016-03-14 DIAGNOSIS — R109 Unspecified abdominal pain: Secondary | ICD-10-CM | POA: Diagnosis not present

## 2016-03-14 DIAGNOSIS — R978 Other abnormal tumor markers: Secondary | ICD-10-CM | POA: Diagnosis not present

## 2016-03-14 DIAGNOSIS — I10 Essential (primary) hypertension: Secondary | ICD-10-CM

## 2016-03-14 DIAGNOSIS — R918 Other nonspecific abnormal finding of lung field: Secondary | ICD-10-CM | POA: Insufficient documentation

## 2016-03-14 DIAGNOSIS — R634 Abnormal weight loss: Secondary | ICD-10-CM | POA: Insufficient documentation

## 2016-03-14 DIAGNOSIS — R5383 Other fatigue: Secondary | ICD-10-CM | POA: Diagnosis not present

## 2016-03-14 DIAGNOSIS — C779 Secondary and unspecified malignant neoplasm of lymph node, unspecified: Secondary | ICD-10-CM | POA: Diagnosis not present

## 2016-03-14 DIAGNOSIS — R7989 Other specified abnormal findings of blood chemistry: Secondary | ICD-10-CM

## 2016-03-14 DIAGNOSIS — R600 Localized edema: Secondary | ICD-10-CM | POA: Insufficient documentation

## 2016-03-14 DIAGNOSIS — Z808 Family history of malignant neoplasm of other organs or systems: Secondary | ICD-10-CM

## 2016-03-14 DIAGNOSIS — R530 Neoplastic (malignant) related fatigue: Secondary | ICD-10-CM

## 2016-03-14 LAB — COMPREHENSIVE METABOLIC PANEL
ALT: 46 U/L (ref 17–63)
AST: 45 U/L — ABNORMAL HIGH (ref 15–41)
Albumin: 3 g/dL — ABNORMAL LOW (ref 3.5–5.0)
Alkaline Phosphatase: 500 U/L — ABNORMAL HIGH (ref 38–126)
Anion gap: 8 (ref 5–15)
BUN: 26 mg/dL — ABNORMAL HIGH (ref 6–20)
CO2: 23 mmol/L (ref 22–32)
Calcium: 8.2 mg/dL — ABNORMAL LOW (ref 8.9–10.3)
Chloride: 100 mmol/L — ABNORMAL LOW (ref 101–111)
Creatinine, Ser: 1.04 mg/dL (ref 0.61–1.24)
GFR calc Af Amer: >60 mL/min (ref >60)
GFR calc non Af Amer: >60 mL/min (ref >60)
Glucose, Bld: 145 mg/dL — ABNORMAL HIGH (ref 65–99)
Potassium: 4 mmol/L (ref 3.5–5.1)
Sodium: 131 mmol/L — ABNORMAL LOW (ref 135–145)
Total Bilirubin: 1.5 mg/dL — ABNORMAL HIGH (ref 0.3–1.2)
Total Protein: 6.3 g/dL — ABNORMAL LOW (ref 6.5–8.1)

## 2016-03-14 LAB — CBC WITH DIFFERENTIAL/PLATELET
Basophils Absolute: 0 10*3/uL (ref 0–0.1)
Basophils Relative: 0 %
Eosinophils Absolute: 0 10*3/uL (ref 0–0.7)
Eosinophils Relative: 0 %
HCT: 29.7 % — ABNORMAL LOW (ref 40.0–52.0)
Hemoglobin: 10.1 g/dL — ABNORMAL LOW (ref 13.0–18.0)
Lymphocytes Relative: 4 %
Lymphs Abs: 0.4 10*3/uL — ABNORMAL LOW (ref 1.0–3.6)
MCH: 30.8 pg (ref 26.0–34.0)
MCHC: 33.9 g/dL (ref 32.0–36.0)
MCV: 91 fL (ref 80.0–100.0)
Monocytes Absolute: 0.8 10*3/uL (ref 0.2–1.0)
Monocytes Relative: 7 %
Neutro Abs: 10.1 10*3/uL — ABNORMAL HIGH (ref 1.4–6.5)
Neutrophils Relative %: 89 %
Platelets: 59 10*3/uL — ABNORMAL LOW (ref 150–440)
RBC: 3.27 MIL/uL — ABNORMAL LOW (ref 4.40–5.90)
RDW: 14 % (ref 11.5–14.5)
WBC: 11.4 10*3/uL — ABNORMAL HIGH (ref 3.8–10.6)

## 2016-03-14 LAB — T4, FREE: Free T4: 2.34 ng/dL — ABNORMAL HIGH (ref 0.61–1.12)

## 2016-03-14 MED ORDER — HEPARIN SOD (PORK) LOCK FLUSH 100 UNIT/ML IV SOLN
INTRAVENOUS | Status: AC
  Filled 2016-03-14: qty 5

## 2016-03-14 MED ORDER — HEPARIN SOD (PORK) LOCK FLUSH 100 UNIT/ML IV SOLN
500.0000 [IU] | Freq: Once | INTRAVENOUS | Status: AC | PRN
  Administered 2016-03-14: 500 [IU]

## 2016-03-14 MED ORDER — SODIUM CHLORIDE 0.9% FLUSH
10.0000 mL | INTRAVENOUS | Status: DC | PRN
  Administered 2016-03-14: 10 mL
  Filled 2016-03-14: qty 10

## 2016-03-14 NOTE — Telephone Encounter (Signed)
Sent TSH and free T4 to PCP via epic

## 2016-03-14 NOTE — Progress Notes (Signed)
Pt here for possible Chemo for Pancreatic cancer. Appetite is low. Megace was filled on Saturday. Has some episodes of dry heaves. Very constipated these days. Takes multiple laxatives and softeners. Has congestion in L ear today. Has bloating and gas all the time with abd pain at intervals. Fatigued.Malaise. Doesn't sleep well. Instructed pt to up his water intake a lot.

## 2016-03-14 NOTE — Progress Notes (Addendum)
[facility] day:  03/14/2016   Chief Complaint: HUEL CENTOLA is a 82 y.o. male with probable metastatic pancreatic cancer who is seen for assessment prior to day 8 of cycle #1 gemcitabine and Abraxane.  HPI:  The patient was last seen in the medical oncology clinic on 03/07/2016.  At that time, he began chemotherapy.  Decision was made to proceed with gemcitabine alone give his elevated liver function tests.  Appetite was poor.  He was losing weight.  A prescription for Megace was provided.  Oxycodone was prescribed for his upper abdominal pain.  A pain diary was requested for conversion to long acting pain medications.  Anemia work-up on 03/07/2016 revealed a ferritin of 516, iron studies with a saturation of 54% and TIBC 188, B12 1695, and folate 12.3.  TSH was 0.129 (0.350-4.5).  Symptomatically, he notes extreme fatigue.  He took Megace for 4 days (Saturday - Tuesday). He has lost 2 more pounds.  He notes constipation (2 bowel movements a week).  He is using Miralax and milk of magnesia (MOM).  He had an episode of dry heaves after chemotherapy.  Ondansetron helped.  He is only taking 1 pain pill in the evening.  He notes seasonal allergies.  He feels his left ear is stopped up.   Past Medical History  Diagnosis Date  . TIA (transient ischemic attack)   . Hypertension   . Diverticulitis   . Cancer ([place])     pancreatic ca  . GERD (gastroesophageal reflux disease)   . History of hiatal hernia   . History of colonic polyps     Past Surgical History  Procedure Laterality Date  . Hernia repair    . Ercp N/A 02/09/2016    Procedure: ENDOSCOPIC RETROGRADE CHOLANGIOPANCREATOGRAPHY (ERCP);  Surgeon: Lucilla Lame, MD;  Location: [facility] ENDOSCOPY;  Service: Endoscopy;  Laterality: N/A;  . Skin cancer excision      x 3  . Colonoscopy    . Esophagogastroduodenoscopy    . Portacath placement Left 02/27/2016    Procedure: INSERTION PORT-A-CATH;   Surgeon: Christene Lye, MD;  Location: [facility] ORS;  Service: General;  Laterality: Left;    Family History  Problem Relation Age of Onset  . Cancer Father 102    lung  . Breast cancer Maternal Aunt   . Brain cancer Maternal Aunt     Social History:  reports that he quit smoking about 41 years ago. He has never used smokeless tobacco. He reports that he drinks alcohol. He reports that he does not use illicit drugs.  The patient is accompanied by his x-wife today.  Allergies:  Allergies  Allergen Reactions  . Lodine [Etodolac] Nausea And Vomiting    Current Medications: Current Outpatient Prescriptions  Medication Sig Dispense Refill  . furosemide (LASIX) 20 MG tablet Take by mouth.    . lidocaine-prilocaine (EMLA) cream Apply 1 application topically as needed. Apply 1/2 tablespoon 1-2 hours prior port being used and cover 30 g 0  . magnesium hydroxide (MILK OF MAGNESIA) 400 MG/5ML suspension Take 30 mLs by mouth daily as needed for mild constipation.    . megestrol (MEGACE) 400 MG/10ML suspension Take 5 mLs (200 mg total) by mouth daily. 150 mL 1  . ondansetron (ZOFRAN-ODT) 4 MG disintegrating tablet Take 1 tablet (4 mg total) by mouth every 6 (six) hours as needed for nausea. Reported on 02/14/2016 30 tablet 2  . oxyCODONE (OXY IR/ROXICODONE) 5 MG immediate  release tablet Take 1 tablet (5 mg total) by mouth every 4 (four) hours as needed for moderate pain. 40 tablet 0  . Polyethylene Glycol 3350 (MIRALAX PO) Take 1 Dose by mouth daily.    . potassium chloride SA (K-DUR,KLOR-CON) 20 MEQ tablet Take by mouth.    . simethicone (MYLICON) 80 MG chewable tablet Chew 80 mg by mouth every 6 (six) hours as needed for flatulence.    Marland Kitchen zolpidem (AMBIEN) 5 MG tablet Take 1 tablet (5 mg total) by mouth at bedtime as needed for sleep. 30 tablet 0  . gabapentin (NEURONTIN) 600 MG tablet Take 1 tablet by mouth 3 (three) times daily. Reported on 03/14/2016    . lisinopril-hydrochlorothiazide  (PRINZIDE,ZESTORETIC) 20-25 MG tablet Take 1 tablet by mouth daily. Reported on 03/14/2016    . omeprazole (PRILOSEC) 20 MG capsule Take 1 capsule by mouth daily. Reported on 03/14/2016     No current facility-administered medications for this visit.    Review of Systems:  GENERAL:  Extreme fatigue.  No fevers or sweats.  Weight down 2 pounds in past week. PERFORMANCE STATUS (ECOG):  1-2 HEENT:  Seasonal allergies.  Left ear stopped up.  No visual changes, sore throat, mouth sores or tenderness. Lungs: No shortness of breath or cough.  No hemoptysis. Cardiac:  No chest pain, palpitations, orthopnea, or PND. GI:  Poor appetite.  Little nausea.  Dry heaves.  Constipation, using Miralax and MOM.  Epigastric discomfort.  No vomiting, diarrhea, melena or hematochezia. GU:  No urgency, frequency, dysuria, or hematuria. Musculoskeletal:  Back pain.  No joint pain.  No muscle tenderness. Extremities:  No pain or swelling. Skin:  No rashes or skin changes. Neuro:  General weakness.  No headache, numbness or weakness, balance or coordination issues. Endocrine:  No diabetes, thyroid issues, hot flashes or night sweats. Psych:  Little depressed.  No  anxiety. Pain:  Upper abdominal pain. Review of systems:  All other systems reviewed and found to be negative.  Physical Exam: Blood pressure 157/79, pulse 118, temperature 97.5 F (36.4 C), temperature source Oral, resp. rate 18, weight 158 lb 15.2 oz (72.1 kg), SpO2 100 %. GENERAL:  Well developed, well nourished, sitting comfortably in the exam room in no acute distress. MENTAL STATUS:  Alert and oriented to person, place and time. HEAD:  Pearline Cables hair.  Normocephalic, atraumatic, face symmetric, no Cushingoid features. EYES:  Glasses.  Hazel eyes.  No icterus.  Pupils equal round and reactive to light and accomodation.  No conjunctivitis or scleral icterus. ENT:  Oropharynx clear without lesion.  Tongue normal. Mucous membranes moist. Tympanic membranes  benign. RESPIRATORY:  Clear to auscultation without rales, wheezes or rhonchi. CARDIOVASCULAR:  Regular rate and rhythm without murmur, rub or gallop. ABDOMEN:  Soft, non-tender, with active bowel sounds, and no hepatosplenomegaly.  No masses. SKIN:  No rashes, ulcers or lesions. EXTREMITIES: Mild left lower extremity edema.  No skin discoloration or tenderness.  No palpable cords. LYMPH NODES: No palpable cervical, supraclavicular, axillary or inguinal adenopathy  NEUROLOGICAL: Unremarkable. PSYCH:  Appropriate.   Appointment on 03/14/2016  Component Date Value Ref Range Status  . WBC 03/14/2016 11.4* 3.8 - 10.6 K/uL Final  . RBC 03/14/2016 3.27* 4.40 - 5.90 MIL/uL Final  . Hemoglobin 03/14/2016 10.1* 13.0 - 18.0 g/dL Final  . HCT 03/14/2016 29.7* 40.0 - 52.0 % Final  . MCV 03/14/2016 91.0  80.0 - 100.0 fL Final  . MCH 03/14/2016 30.8  26.0 - 34.0 pg Final  .  MCHC 03/14/2016 33.9  32.0 - 36.0 g/dL Final  . RDW 03/14/2016 14.0  11.5 - 14.5 % Final  . Platelets 03/14/2016 59* 150 - 440 K/uL Final  . Neutrophils Relative % 03/14/2016 89   Final  . Neutro Abs 03/14/2016 10.1* 1.4 - 6.5 K/uL Final  . Lymphocytes Relative 03/14/2016 4   Final  . Lymphs Abs 03/14/2016 0.4* 1.0 - 3.6 K/uL Final  . Monocytes Relative 03/14/2016 7   Final  . Monocytes Absolute 03/14/2016 0.8  0.2 - 1.0 K/uL Final  . Eosinophils Relative 03/14/2016 0   Final  . Eosinophils Absolute 03/14/2016 0.0  0 - 0.7 K/uL Final  . Basophils Relative 03/14/2016 0   Final  . Basophils Absolute 03/14/2016 0.0  0 - 0.1 K/uL Final  . Sodium 03/14/2016 131* 135 - 145 mmol/L Final  . Potassium 03/14/2016 4.0  3.5 - 5.1 mmol/L Final  . Chloride 03/14/2016 100* 101 - 111 mmol/L Final  . CO2 03/14/2016 23  22 - 32 mmol/L Final  . Glucose, Bld 03/14/2016 145* 65 - 99 mg/dL Final  . BUN 03/14/2016 26* 6 - 20 mg/dL Final  . Creatinine, Ser 03/14/2016 1.04  0.61 - 1.24 mg/dL Final  . Calcium 03/14/2016 8.2* 8.9 - 10.3 mg/dL Final   . Total Protein 03/14/2016 6.3* 6.5 - 8.1 g/dL Final  . Albumin 03/14/2016 3.0* 3.5 - 5.0 g/dL Final  . AST 03/14/2016 45* 15 - 41 U/L Final  . ALT 03/14/2016 46  17 - 63 U/L Final  . Alkaline Phosphatase 03/14/2016 500* 38 - 126 U/L Final  . Total Bilirubin 03/14/2016 1.5* 0.3 - 1.2 mg/dL Final  . GFR calc non Af Amer 03/14/2016 >60  >60 mL/min Final  . GFR calc Af Amer 03/14/2016 >60  >60 mL/min Final   Comment: (NOTE) The eGFR has been calculated using the CKD EPI equation. This calculation has not been validated in all clinical situations. eGFR's persistently <60 mL/min signify possible Chronic Kidney Disease.   . Anion gap 03/14/2016 8  5 - 15 Final    Assessment:  TANER RZEPKA is a 82 y.o. male with metastatic pancreatic cancer. He presented on 02/08/2016 with fatigue, 20 pound weight loss, abdominal discomfort and jaundice (bilirubin 9.3). ERCP on 02/09/2016 revealed segmental biliary stricture. The upper third of the main bile duct was dilated. A biliary sphincterotomy was performed and a metal stent placed. Left supraclavicular lymph node biopsy on 03/04/2016 revealed adenocarcinoma morphologically consistent with a pancreatobiliary origin.  CA19-9 is elevated: 83,355 on 02/10/2016, 227,038 on 02/19/2016, and 353,416 on 03/07/2016.  PET scan on 02/15/2016 revealed a mass in body of pancreas which was intensely hypermetabolic compatible with primary adenocarcinoma of the pancreas. There were multifocal hypermetabolic liver metastases. There was hypermetabolic mediastinal and left supraclavicular lymph nodes compatible with metastatic adenopathy. There were numerous small pulmonary nodules, too small to characterize by PET-CT but suspicious for metastatic disease.  He presented to the emergency room on 02/14/2016 with fever. Abdomen and pelvic CT scan on 02/14/2016 revealed a 2.3 x 3.2 cm mass at the head/ body of pancreas with pancreatic atrophy and ductal dilatation  consistent with adenocarcinoma. There was local infiltration into the peripancreatic fat and celiac axis. There were multiple hepatic metastases. There were indeterminate nodules in the left lung base. There was a biliary stent with associated pneumobilia. Cultures were negative. He received Rocephin and was discharged on Levaquin.  He developed thrombocytopenia (platelet count 77,000) on 02/19/2016. Work-up reveals the following  normal labs: PTT, fibrinogen, creatinine, ANA, hepatitis B surface antigen, hepatitis B core total antibody, and hepatitis C antibody. Hepatin induced thrombocytopenia (HIT) testing and serotonin release assay were negative. Platelet count in a blue top tube ruled out pseudo-thrombocytopenia.   He has progressive normocytic anemia.  Work-up on 03/07/2016 revealed an elvated ferritin (516) and iron studies with a saturation of 54% and TIBC 188.  B12 was 1695 and folate 12.3.  TSH was 0.129 (0.350-4.5).  Free T4 was 2.34 (0.61-1.12) on 03/14/2016.  Left lower extremity duplex on 02/24/2016 revealed no evidence of DVT.  He is currently day 8 of cycle #1 gemcitabine (03/07/2016).  Symptomatically, he is fatigued. He continues to lose weight. He has upper abdominal pain managed with minimal pain medications. Exam is stable.  Platelet count dropped from 124,000 to 59,000 in 1 week.  Plan: 1.  Labs today:  CBC with diff, CMP, free T4. 2.  Discuss plan to hold chemotherapy secondary to thrombocytopenia.  Etiology unclear.  Possible ITP or marrow involvement/limited reserve.  Discuss chemotherapy next week if counts recovered (platelets > 100,000). 3.  Discuss fatigue and patient's thoughts about continuation of chemotherapy.  Discuss trial of steroids (Decadron 2-4 mg a day), Provigil, or Ritalin.  Information provided. 4.  Discuss importance of caloric intake.  Discuss Ensure/Boost with ice cream.  Discuss 1/2 peanut butter sandwiches. 5.  No chemotherapy today. 6.   RTC in 1 week for MD assessment, labs (CBC with diff, CMP), and week #2 gemcitabine.  Addendum:  Patient contacted clinic after his appointment about trial of Provigil.  Per insurance, preauth required.   Lequita Asal, MD  03/14/2016

## 2016-03-14 NOTE — Telephone Encounter (Signed)
Called to state that he wants to try the Provigil and to ask that you please send Rx to pharmacy

## 2016-03-15 ENCOUNTER — Other Ambulatory Visit: Payer: Self-pay

## 2016-03-15 DIAGNOSIS — C250 Malignant neoplasm of head of pancreas: Secondary | ICD-10-CM

## 2016-03-17 ENCOUNTER — Encounter: Payer: Self-pay | Admitting: Hematology and Oncology

## 2016-03-17 MED ORDER — MODAFINIL 100 MG PO TABS: 100.0000 mg | ORAL_TABLET | Freq: Every day | ORAL | Status: DC

## 2016-03-21 ENCOUNTER — Other Ambulatory Visit: Payer: Self-pay | Admitting: Hematology and Oncology

## 2016-03-21 ENCOUNTER — Inpatient Hospital Stay: Payer: Medicare Other

## 2016-03-21 ENCOUNTER — Inpatient Hospital Stay (HOSPITAL_BASED_OUTPATIENT_CLINIC_OR_DEPARTMENT_OTHER): Payer: Medicare Other | Admitting: Hematology and Oncology

## 2016-03-21 VITALS — BP 133/83 | HR 105 | Temp 97.2°F | Resp 18 | Wt 160.1 lb

## 2016-03-21 DIAGNOSIS — E43 Unspecified severe protein-calorie malnutrition: Secondary | ICD-10-CM

## 2016-03-21 DIAGNOSIS — R630 Anorexia: Secondary | ICD-10-CM

## 2016-03-21 DIAGNOSIS — C250 Malignant neoplasm of head of pancreas: Secondary | ICD-10-CM

## 2016-03-21 DIAGNOSIS — C778 Secondary and unspecified malignant neoplasm of lymph nodes of multiple regions: Secondary | ICD-10-CM | POA: Diagnosis not present

## 2016-03-21 DIAGNOSIS — C787 Secondary malignant neoplasm of liver and intrahepatic bile duct: Secondary | ICD-10-CM

## 2016-03-21 DIAGNOSIS — R918 Other nonspecific abnormal finding of lung field: Secondary | ICD-10-CM

## 2016-03-21 DIAGNOSIS — D649 Anemia, unspecified: Secondary | ICD-10-CM

## 2016-03-21 DIAGNOSIS — C251 Malignant neoplasm of body of pancreas: Secondary | ICD-10-CM | POA: Diagnosis not present

## 2016-03-21 DIAGNOSIS — Z5111 Encounter for antineoplastic chemotherapy: Secondary | ICD-10-CM | POA: Diagnosis not present

## 2016-03-21 DIAGNOSIS — Z9221 Personal history of antineoplastic chemotherapy: Secondary | ICD-10-CM

## 2016-03-21 DIAGNOSIS — R634 Abnormal weight loss: Secondary | ICD-10-CM

## 2016-03-21 DIAGNOSIS — Z79899 Other long term (current) drug therapy: Secondary | ICD-10-CM

## 2016-03-21 DIAGNOSIS — C801 Malignant (primary) neoplasm, unspecified: Secondary | ICD-10-CM

## 2016-03-21 DIAGNOSIS — D696 Thrombocytopenia, unspecified: Secondary | ICD-10-CM

## 2016-03-21 DIAGNOSIS — R109 Unspecified abdominal pain: Secondary | ICD-10-CM

## 2016-03-21 DIAGNOSIS — R978 Other abnormal tumor markers: Secondary | ICD-10-CM

## 2016-03-21 DIAGNOSIS — E0590 Thyrotoxicosis, unspecified without thyrotoxic crisis or storm: Secondary | ICD-10-CM

## 2016-03-21 LAB — COMPREHENSIVE METABOLIC PANEL
ALT: 39 U/L (ref 17–63)
AST: 33 U/L (ref 15–41)
Albumin: 3.2 g/dL — ABNORMAL LOW (ref 3.5–5.0)
Alkaline Phosphatase: 391 U/L — ABNORMAL HIGH (ref 38–126)
Anion gap: 6 (ref 5–15)
BUN: 28 mg/dL — ABNORMAL HIGH (ref 6–20)
CO2: 21 mmol/L — ABNORMAL LOW (ref 22–32)
Calcium: 8.2 mg/dL — ABNORMAL LOW (ref 8.9–10.3)
Chloride: 104 mmol/L (ref 101–111)
Creatinine, Ser: 1.05 mg/dL (ref 0.61–1.24)
GFR calc Af Amer: >60 mL/min (ref >60)
GFR calc non Af Amer: >60 mL/min (ref >60)
Glucose, Bld: 184 mg/dL — ABNORMAL HIGH (ref 65–99)
Potassium: 3.9 mmol/L (ref 3.5–5.1)
Sodium: 131 mmol/L — ABNORMAL LOW (ref 135–145)
Total Bilirubin: 1.7 mg/dL — ABNORMAL HIGH (ref 0.3–1.2)
Total Protein: 6.3 g/dL — ABNORMAL LOW (ref 6.5–8.1)

## 2016-03-21 LAB — PROTIME-INR
INR: 1.17
Prothrombin Time: 15.1 seconds — ABNORMAL HIGH (ref 11.4–15.0)

## 2016-03-21 LAB — CBC WITH DIFFERENTIAL/PLATELET
Basophils Absolute: 0.1 10*3/uL (ref 0–0.1)
Basophils Relative: 1 %
Eosinophils Absolute: 0.1 10*3/uL (ref 0–0.7)
Eosinophils Relative: 1 %
HCT: 29.9 % — ABNORMAL LOW (ref 40.0–52.0)
Hemoglobin: 10.1 g/dL — ABNORMAL LOW (ref 13.0–18.0)
Lymphocytes Relative: 3 %
Lymphs Abs: 0.4 10*3/uL — ABNORMAL LOW (ref 1.0–3.6)
MCH: 31.1 pg (ref 26.0–34.0)
MCHC: 33.8 g/dL (ref 32.0–36.0)
MCV: 92.1 fL (ref 80.0–100.0)
Monocytes Absolute: 1.1 10*3/uL — ABNORMAL HIGH (ref 0.2–1.0)
Monocytes Relative: 8 %
Neutro Abs: 11.9 10*3/uL — ABNORMAL HIGH (ref 1.4–6.5)
Neutrophils Relative %: 87 %
Platelets: 225 10*3/uL (ref 150–440)
RBC: 3.25 MIL/uL — ABNORMAL LOW (ref 4.40–5.90)
RDW: 14.8 % — ABNORMAL HIGH (ref 11.5–14.5)
WBC: 13.6 10*3/uL — ABNORMAL HIGH (ref 3.8–10.6)

## 2016-03-21 MED ORDER — HEPARIN SOD (PORK) LOCK FLUSH 100 UNIT/ML IV SOLN
500.0000 [IU] | Freq: Once | INTRAVENOUS | Status: AC
  Administered 2016-03-21: 500 [IU] via INTRAVENOUS

## 2016-03-21 MED ORDER — SODIUM CHLORIDE 0.9 % IV SOLN
800.0000 mg/m2 | Freq: Once | INTRAVENOUS | Status: AC
  Administered 2016-03-21: 1558 mg via INTRAVENOUS
  Filled 2016-03-21: qty 35.72

## 2016-03-21 MED ORDER — SODIUM CHLORIDE 0.9% FLUSH
10.0000 mL | Freq: Once | INTRAVENOUS | Status: AC
  Administered 2016-03-21: 10 mL via INTRAVENOUS
  Filled 2016-03-21: qty 10

## 2016-03-21 MED ORDER — ONDANSETRON HCL 40 MG/20ML IJ SOLN
Freq: Once | INTRAMUSCULAR | Status: AC
  Administered 2016-03-21: 11:00:00 via INTRAVENOUS
  Filled 2016-03-21: qty 4

## 2016-03-21 MED ORDER — SODIUM CHLORIDE 0.9 % IV SOLN
Freq: Once | INTRAVENOUS | Status: AC
  Administered 2016-03-21: 11:00:00 via INTRAVENOUS
  Filled 2016-03-21: qty 1000

## 2016-03-21 MED ORDER — HEPARIN SOD (PORK) LOCK FLUSH 100 UNIT/ML IV SOLN
INTRAVENOUS | Status: AC
Start: 2016-03-21 — End: 2016-03-21
  Filled 2016-03-21: qty 5

## 2016-03-21 NOTE — Progress Notes (Signed)
Referral to Dr. Gabriel Carina has been faxed with notes/labs. Dr. Joycie Peek office will review records and arrange for an appt.

## 2016-03-21 NOTE — Progress Notes (Signed)
[facility] day:  03/21/2016   Chief Complaint: Cody Andrade is a 82 y.o. male with probable metastatic pancreatic cancer who is seen for assessment prior to day 8 of cycle #1 gemcitabine and Abraxane.  HPI:  The patient was last seen in the medical oncology clinic on 03/14/2016.  At that time, he was seen prior to day 8 of cycle #1 gemcitabine.  Chemotherapy was held as his platelet count dropped from 124,000 to 59,000 in 1 week.  Symptomatically, he was fatigued. He was losing weight. He has upper abdominal pain managed with minimal pain medications. Exam was stable.  During the interim, he has improved.  He is eating more on Megace.  He describes eating 5 small meals a day with milk shakes.  He has gained 2 pounds.  He decided to try Provigil.  A preauth was required.  Decision was also made to hold his Provigil until evaluation for his thyroid function tests.  He took someone else's Provigil (1/2 pill one day then 1 pill x 2 days).  He felt energized after the first dose only.   Past Medical History  Diagnosis Date  . TIA (transient ischemic attack)   . Hypertension   . Diverticulitis   . Cancer ([place])     pancreatic ca  . GERD (gastroesophageal reflux disease)   . History of hiatal hernia   . History of colonic polyps     Past Surgical History  Procedure Laterality Date  . Hernia repair    . Ercp N/A 02/09/2016    Procedure: ENDOSCOPIC RETROGRADE CHOLANGIOPANCREATOGRAPHY (ERCP);  Surgeon: Lucilla Lame, MD;  Location: [facility] ENDOSCOPY;  Service: Endoscopy;  Laterality: N/A;  . Skin cancer excision      x 3  . Colonoscopy    . Esophagogastroduodenoscopy    . Portacath placement Left 02/27/2016    Procedure: INSERTION PORT-A-CATH;  Surgeon: Christene Lye, MD;  Location: [facility] ORS;  Service: General;  Laterality: Left;    Family History  Problem Relation Age of Onset  . Cancer Father 80    lung  . Breast cancer Maternal Aunt    . Brain cancer Maternal Aunt     Social History:  reports that he quit smoking about 41 years ago. He has never used smokeless tobacco. He reports that he drinks alcohol. He reports that he does not use illicit drugs.  The patient is accompanied by his x-wife and son, [name], today.  Allergies:  Allergies  Allergen Reactions  . Lodine [Etodolac] Nausea And Vomiting    Current Medications: Current Outpatient Prescriptions  Medication Sig Dispense Refill  . furosemide (LASIX) 20 MG tablet Take 20 mg by mouth daily.     Marland Kitchen lidocaine-prilocaine (EMLA) cream Apply 1 application topically as needed. Apply 1/2 tablespoon 1-2 hours prior port being used and cover 30 g 0  . magnesium hydroxide (MILK OF MAGNESIA) 400 MG/5ML suspension Take 30 mLs by mouth daily as needed for mild constipation.    . megestrol (MEGACE) 400 MG/10ML suspension Take 5 mLs (200 mg total) by mouth daily. 150 mL 1  . modafinil (PROVIGIL) 100 MG tablet Take 1 tablet (100 mg total) by mouth daily. 30 tablet 0  . ondansetron (ZOFRAN-ODT) 4 MG disintegrating tablet Take 1 tablet (4 mg total) by mouth every 6 (six) hours as needed for nausea. Reported on 02/14/2016 30 tablet 2  . oxyCODONE (OXY IR/ROXICODONE) 5 MG immediate release tablet Take 1 tablet (5  mg total) by mouth every 4 (four) hours as needed for moderate pain. 40 tablet 0  . potassium chloride SA (K-DUR,KLOR-CON) 20 MEQ tablet Take 20 mEq by mouth daily.     . simethicone (MYLICON) 80 MG chewable tablet Chew 80 mg by mouth every 6 (six) hours as needed for flatulence.    Marland Kitchen zolpidem (AMBIEN) 5 MG tablet Take 1 tablet (5 mg total) by mouth at bedtime as needed for sleep. 30 tablet 0   No current facility-administered medications for this visit.   Facility-Administered Medications Ordered in Other Visits  Medication Dose Route Frequency Provider Last Rate Last Dose  . sodium chloride flush (NS) 0.9 % injection 10 mL  10 mL Intracatheter PRN Lequita Asal, MD   10  mL at 03/14/16 0955    Review of Systems:  GENERAL:  Fatigue, slightly improved.  No fevers or sweats.  Weight up 2 pounds in past week. PERFORMANCE STATUS (ECOG):  1-2 HEENT:  Seasonal allergies.  No visual changes, sore throat, mouth sores or tenderness. Lungs: No shortness of breath or cough.  No hemoptysis. Cardiac:  No chest pain, palpitations, orthopnea, or PND. GI:  Appetite 50%.  Occasional nausea.  Indigestion.  Epigastric discomfort.  No vomiting, diarrhea, constipation, melena or hematochezia. GU:  No urgency, frequency, dysuria, or hematuria. Musculoskeletal:  Back pain.  No joint pain.  No muscle tenderness. Extremities:  No pain or swelling. Skin:  No rashes or skin changes. Neuro:  General weakness.  Occasional tingling in hands.  No headache, numbness or weakness, balance or coordination issues. Endocrine:  No diabetes, thyroid issues, hot flashes or night sweats. Psych:  No  anxiety. Pain:  Upper abdominal pain. Review of systems:  All other systems reviewed and found to be negative.  Physical Exam: Blood pressure 133/83, pulse 105, temperature 97.2 F (36.2 C), temperature source Tympanic, resp. rate 18, weight 160 lb 0.9 oz (72.6 kg). GENERAL:  Well developed, well nourished, sitting comfortably in the exam room in no acute distress. MENTAL STATUS:  Alert and oriented to person, place and time. HEAD:  Pearline Cables hair.  Normocephalic, atraumatic, face symmetric, no Cushingoid features. EYES:  Glasses.  Hazel eyes.  Pupils equal round and reactive to light and accomodation.  No conjunctivitis or scleral icterus. ENT:  Oropharynx clear without lesion.  Tongue normal. Mucous membranes moist. Tympanic membranes benign. RESPIRATORY:  Clear to auscultation without rales, wheezes or rhonchi. CARDIOVASCULAR:  Regular rate and rhythm without murmur, rub or gallop. ABDOMEN:  Soft, non-tender, with active bowel sounds, and no hepatosplenomegaly.  No masses. SKIN:  No rashes, ulcers or  lesions. EXTREMITIES: Mild left lower extremity edema (chronic).  No skin discoloration or tenderness.  No palpable cords. LYMPH NODES: No palpable cervical, supraclavicular, axillary or inguinal adenopathy  NEUROLOGICAL: Unremarkable. PSYCH:  Appropriate.   Clinical Support on 03/21/2016  Component Date Value Ref Range Status  . WBC 03/21/2016 13.6* 3.8 - 10.6 K/uL Final  . RBC 03/21/2016 3.25* 4.40 - 5.90 MIL/uL Final  . Hemoglobin 03/21/2016 10.1* 13.0 - 18.0 g/dL Final  . HCT 03/21/2016 29.9* 40.0 - 52.0 % Final  . MCV 03/21/2016 92.1  80.0 - 100.0 fL Final  . MCH 03/21/2016 31.1  26.0 - 34.0 pg Final  . MCHC 03/21/2016 33.8  32.0 - 36.0 g/dL Final  . RDW 03/21/2016 14.8* 11.5 - 14.5 % Final  . Platelets 03/21/2016 225  150 - 440 K/uL Final  . Neutrophils Relative % 03/21/2016 87   Final  .  Neutro Abs 03/21/2016 11.9* 1.4 - 6.5 K/uL Final  . Lymphocytes Relative 03/21/2016 3   Final  . Lymphs Abs 03/21/2016 0.4* 1.0 - 3.6 K/uL Final  . Monocytes Relative 03/21/2016 8   Final  . Monocytes Absolute 03/21/2016 1.1* 0.2 - 1.0 K/uL Final  . Eosinophils Relative 03/21/2016 1   Final  . Eosinophils Absolute 03/21/2016 0.1  0 - 0.7 K/uL Final  . Basophils Relative 03/21/2016 1   Final  . Basophils Absolute 03/21/2016 0.1  0 - 0.1 K/uL Final  . Sodium 03/21/2016 131* 135 - 145 mmol/L Final  . Potassium 03/21/2016 3.9  3.5 - 5.1 mmol/L Final  . Chloride 03/21/2016 104  101 - 111 mmol/L Final  . CO2 03/21/2016 21* 22 - 32 mmol/L Final  . Glucose, Bld 03/21/2016 184* 65 - 99 mg/dL Final  . BUN 03/21/2016 28* 6 - 20 mg/dL Final  . Creatinine, Ser 03/21/2016 1.05  0.61 - 1.24 mg/dL Final  . Calcium 03/21/2016 8.2* 8.9 - 10.3 mg/dL Final  . Total Protein 03/21/2016 6.3* 6.5 - 8.1 g/dL Final  . Albumin 03/21/2016 3.2* 3.5 - 5.0 g/dL Final  . AST 03/21/2016 33  15 - 41 U/L Final  . ALT 03/21/2016 39  17 - 63 U/L Final  . Alkaline Phosphatase 03/21/2016 391* 38 - 126 U/L Final  . Total  Bilirubin 03/21/2016 1.7* 0.3 - 1.2 mg/dL Final  . GFR calc non Af Amer 03/21/2016 >60  >60 mL/min Final  . GFR calc Af Amer 03/21/2016 >60  >60 mL/min Final   Comment: (NOTE) The eGFR has been calculated using the CKD EPI equation. This calculation has not been validated in all clinical situations. eGFR's persistently <60 mL/min signify possible Chronic Kidney Disease.   . Anion gap 03/21/2016 6  5 - 15 Final    Assessment:  Cody Andrade is a 82 y.o. male with metastatic pancreatic cancer. He presented on 02/08/2016 with fatigue, 20 pound weight loss, abdominal discomfort and jaundice (bilirubin 9.3). ERCP on 02/09/2016 revealed segmental biliary stricture. The upper third of the main bile duct was dilated. A biliary sphincterotomy was performed and a metal stent placed. Left supraclavicular lymph node biopsy on 03/04/2016 revealed adenocarcinoma morphologically consistent with a pancreatobiliary origin.  CA19-9 is elevated: 83,355 on 02/10/2016, 227,038 on 02/19/2016, and 353,416 on 03/07/2016.  PET scan on 02/15/2016 revealed a mass in body of pancreas which was intensely hypermetabolic compatible with primary adenocarcinoma of the pancreas. There were multifocal hypermetabolic liver metastases. There was hypermetabolic mediastinal and left supraclavicular lymph nodes compatible with metastatic adenopathy. There were numerous small pulmonary nodules, too small to characterize by PET-CT but suspicious for metastatic disease.  He presented to the emergency room on 02/14/2016 with fever. Abdomen and pelvic CT scan on 02/14/2016 revealed a 2.3 x 3.2 cm mass at the head/ body of pancreas with pancreatic atrophy and ductal dilatation consistent with adenocarcinoma. There was local infiltration into the peripancreatic fat and celiac axis. There were multiple hepatic metastases. There were indeterminate nodules in the left lung base. There was a biliary stent with associated  pneumobilia. Cultures were negative. He received Rocephin and was discharged on Levaquin.  He developed thrombocytopenia (platelet count 77,000) on 02/19/2016. Work-up reveals the following normal labs: PTT, fibrinogen, creatinine, ANA, hepatitis B surface antigen, hepatitis B core total antibody, and hepatitis C antibody. Hepatin induced thrombocytopenia (HIT) testing and serotonin release assay were negative. Platelet count in a blue top tube ruled out pseudo-thrombocytopenia.  He has progressive normocytic anemia.  Work-up on 03/07/2016 revealed an elvated ferritin (516) and iron studies with a saturation of 54% and TIBC 188.  B12 was 1695 and folate 12.3.  TSH was 0.129 (0.350-4.5).  Free T4 was 2.34 (0.61-1.12) on 03/14/2016.  Left lower extremity duplex on 02/24/2016 revealed no evidence of DVT.  Thyroid function studies on 03/08/2016 revealed a TSH of 0.129 (low) and a free T4 of 2.34 (0.61-1.12) c/w hyperthyroidism.  He is currently day 8 of cycle #1 gemcitabine (03/07/2016).  Chemotherapy was held on 03/14/2016 for a platelet count of 59,000.  Symptomatically, he is fatigued. He has gained weight with frequent small meals, milk shakes, and Megace. He has upper abdominal pain managed with minimal pain medications. Exam is stable.  Platelet count has recovered.  Plan: 1.  Labs today:  CBC with diff, CMP. 2.  Discuss plan proceed with day 8 gemcitabine alone.  If platelet count good next week, he will receive day 15 gemcitabine then 1 week off.  If counts remain good and LFTs acceptable after cycle #1, will proceed with gemcitabine and Abraxane with cycle #2.  Patient in agreement. 3.  Day 8 gemcitabine today. 4.  Discuss not taking Provigil (Rx not sent). 5.  Schedule referral to Dr. Gabriel Carina for thyroid evaluation, per patient's request [phone]). 6.  RTC in 1 week for MD assessment, labs (CBC with diff, CMP), and day 15 of cycle #1 gemcitabine.   Lequita Asal, MD   03/21/2016

## 2016-03-24 ENCOUNTER — Encounter: Payer: Self-pay | Admitting: Hematology and Oncology

## 2016-03-26 ENCOUNTER — Encounter: Payer: Self-pay | Admitting: Hematology and Oncology

## 2016-03-28 ENCOUNTER — Ambulatory Visit
Admission: RE | Admit: 2016-03-28 | Discharge: 2016-03-28 | Disposition: A | Discharge status: Home / Self Care | Payer: Medicare Other | Source: Ambulatory Visit | Attending: Hematology and Oncology | Admitting: Hematology and Oncology

## 2016-03-28 ENCOUNTER — Inpatient Hospital Stay: Payer: Medicare Other

## 2016-03-28 ENCOUNTER — Inpatient Hospital Stay (HOSPITAL_BASED_OUTPATIENT_CLINIC_OR_DEPARTMENT_OTHER): Payer: Medicare Other | Admitting: Hematology and Oncology

## 2016-03-28 ENCOUNTER — Other Ambulatory Visit: Payer: Self-pay | Admitting: Hematology and Oncology

## 2016-03-28 ENCOUNTER — Other Ambulatory Visit: Payer: Self-pay | Admitting: *Deleted

## 2016-03-28 VITALS — BP 143/85 | HR 100 | Resp 20

## 2016-03-28 VITALS — BP 124/76 | HR 105 | Temp 96.9°F | Wt 157.0 lb

## 2016-03-28 DIAGNOSIS — C778 Secondary and unspecified malignant neoplasm of lymph nodes of multiple regions: Secondary | ICD-10-CM

## 2016-03-28 DIAGNOSIS — M7989 Other specified soft tissue disorders: Secondary | ICD-10-CM

## 2016-03-28 DIAGNOSIS — R918 Other nonspecific abnormal finding of lung field: Secondary | ICD-10-CM | POA: Diagnosis not present

## 2016-03-28 DIAGNOSIS — R0602 Shortness of breath: Secondary | ICD-10-CM | POA: Diagnosis present

## 2016-03-28 DIAGNOSIS — I2510 Atherosclerotic heart disease of native coronary artery without angina pectoris: Secondary | ICD-10-CM | POA: Diagnosis not present

## 2016-03-28 DIAGNOSIS — C787 Secondary malignant neoplasm of liver and intrahepatic bile duct: Secondary | ICD-10-CM | POA: Diagnosis not present

## 2016-03-28 DIAGNOSIS — Z5111 Encounter for antineoplastic chemotherapy: Secondary | ICD-10-CM | POA: Diagnosis not present

## 2016-03-28 DIAGNOSIS — E0590 Thyrotoxicosis, unspecified without thyrotoxic crisis or storm: Secondary | ICD-10-CM

## 2016-03-28 DIAGNOSIS — R634 Abnormal weight loss: Secondary | ICD-10-CM | POA: Diagnosis not present

## 2016-03-28 DIAGNOSIS — C251 Malignant neoplasm of body of pancreas: Secondary | ICD-10-CM

## 2016-03-28 DIAGNOSIS — Z8507 Personal history of malignant neoplasm of pancreas: Secondary | ICD-10-CM | POA: Insufficient documentation

## 2016-03-28 DIAGNOSIS — C250 Malignant neoplasm of head of pancreas: Secondary | ICD-10-CM

## 2016-03-28 DIAGNOSIS — Z79899 Other long term (current) drug therapy: Secondary | ICD-10-CM

## 2016-03-28 DIAGNOSIS — R600 Localized edema: Secondary | ICD-10-CM

## 2016-03-28 DIAGNOSIS — M79605 Pain in left leg: Secondary | ICD-10-CM

## 2016-03-28 DIAGNOSIS — E43 Unspecified severe protein-calorie malnutrition: Secondary | ICD-10-CM

## 2016-03-28 LAB — CBC WITH DIFFERENTIAL/PLATELET
Basophils Absolute: 0.1 10*3/uL (ref 0–0.1)
Basophils Relative: 1 %
Eosinophils Absolute: 0 10*3/uL (ref 0–0.7)
Eosinophils Relative: 0 %
HCT: 28.9 % — ABNORMAL LOW (ref 40.0–52.0)
Hemoglobin: 9.9 g/dL — ABNORMAL LOW (ref 13.0–18.0)
Lymphocytes Relative: 5 %
Lymphs Abs: 0.5 10*3/uL — ABNORMAL LOW (ref 1.0–3.6)
MCH: 31.6 pg (ref 26.0–34.0)
MCHC: 34.3 g/dL (ref 32.0–36.0)
MCV: 92.1 fL (ref 80.0–100.0)
Monocytes Absolute: 0.8 10*3/uL (ref 0.2–1.0)
Monocytes Relative: 8 %
Neutro Abs: 7.9 10*3/uL — ABNORMAL HIGH (ref 1.4–6.5)
Neutrophils Relative %: 86 %
Platelets: 123 10*3/uL — ABNORMAL LOW (ref 150–440)
RBC: 3.14 MIL/uL — ABNORMAL LOW (ref 4.40–5.90)
RDW: 14.8 % — ABNORMAL HIGH (ref 11.5–14.5)
WBC: 9.2 10*3/uL (ref 3.8–10.6)

## 2016-03-28 LAB — COMPREHENSIVE METABOLIC PANEL
ALT: 51 U/L (ref 17–63)
AST: 40 U/L (ref 15–41)
Albumin: 3.1 g/dL — ABNORMAL LOW (ref 3.5–5.0)
Alkaline Phosphatase: 530 U/L — ABNORMAL HIGH (ref 38–126)
Anion gap: 5 (ref 5–15)
BUN: 30 mg/dL — ABNORMAL HIGH (ref 6–20)
CO2: 23 mmol/L (ref 22–32)
Calcium: 8.4 mg/dL — ABNORMAL LOW (ref 8.9–10.3)
Chloride: 103 mmol/L (ref 101–111)
Creatinine, Ser: 1.04 mg/dL (ref 0.61–1.24)
GFR calc Af Amer: >60 mL/min (ref >60)
GFR calc non Af Amer: >60 mL/min (ref >60)
Glucose, Bld: 149 mg/dL — ABNORMAL HIGH (ref 65–99)
Potassium: 4.2 mmol/L (ref 3.5–5.1)
Sodium: 131 mmol/L — ABNORMAL LOW (ref 135–145)
Total Bilirubin: 1.2 mg/dL (ref 0.3–1.2)
Total Protein: 6.2 g/dL — ABNORMAL LOW (ref 6.5–8.1)

## 2016-03-28 MED ORDER — IOPAMIDOL (ISOVUE-370) INJECTION 76%
75.0000 mL | Freq: Once | INTRAVENOUS | Status: AC | PRN
  Administered 2016-03-28: 75 mL via INTRAVENOUS

## 2016-03-28 MED ORDER — HEPARIN SOD (PORK) LOCK FLUSH 100 UNIT/ML IV SOLN: 500.0000 [IU] | Freq: Once | INTRAVENOUS | Status: DC | PRN

## 2016-03-28 MED ORDER — SODIUM CHLORIDE 0.9% FLUSH
10.0000 mL | INTRAVENOUS | Status: DC | PRN
  Filled 2016-03-28: qty 10

## 2016-03-28 MED ORDER — SODIUM CHLORIDE 0.9 % IV SOLN
Freq: Once | INTRAVENOUS | Status: AC
  Administered 2016-03-28: 12:00:00 via INTRAVENOUS
  Filled 2016-03-28: qty 1000

## 2016-03-28 MED ORDER — SODIUM CHLORIDE 0.9 % IV SOLN
800.0000 mg/m2 | Freq: Once | INTRAVENOUS | Status: AC
  Administered 2016-03-28: 1558 mg via INTRAVENOUS
  Filled 2016-03-28: qty 41

## 2016-03-28 MED ORDER — SODIUM CHLORIDE 0.9% FLUSH
10.0000 mL | INTRAVENOUS | Status: DC | PRN
  Administered 2016-03-28: 10 mL via INTRAVENOUS
  Filled 2016-03-28: qty 10

## 2016-03-28 MED ORDER — SODIUM CHLORIDE 0.9 % IV SOLN
Freq: Once | INTRAVENOUS | Status: AC
  Administered 2016-03-28: 13:00:00 via INTRAVENOUS
  Filled 2016-03-28: qty 4

## 2016-03-28 MED ORDER — HEPARIN SOD (PORK) LOCK FLUSH 100 UNIT/ML IV SOLN
500.0000 [IU] | Freq: Once | INTRAVENOUS | Status: AC
  Administered 2016-03-28: 500 [IU] via INTRAVENOUS
  Filled 2016-03-28: qty 5

## 2016-03-28 NOTE — Progress Notes (Signed)
[facility] day:  03/28/2016   Chief Complaint: Cody Andrade is a 82 y.o. male with probable metastatic pancreatic cancer who is seen for assessment prior to day 15 of cycle #1 gemcitabine and Abraxane.  HPI:  The patient was last seen in the medical oncology clinic on 03/21/2016.  At that time, he received day 8 of cycle #1 gemcitabine.  Symptomatically, he was fatigued. He had gained weight with frequent small meals, milk shakes, and Megace. He had upper abdominal pain managed with minimal pain medications.  Platelet count was 225,000.  He saw Dr. Lucilla Lame on 03/27/2016 secondary to hyperthyroidism (low TSH, elevated free T4).  In 2010, he underwent thyroid ultrasound at [facility] and was found to have bilateral thyroid nodules.  Thyroid uptake scan revealed hypofunctioning areas (left > right).  He had no further testing.  He was prescribed methimazole 5 mg a day by Dr Gabriel Carina.  He has follow-up in 8 weeks.  During the interim, he notes increased shortness of breath.  He denies any pleuritic chest pain.  He has persistent left lower extremity edema (diuplex negative on 02/24/2016).  Appetite is fair.  Weight is down 2 pounds.   Past Medical History  Diagnosis Date  . TIA (transient ischemic attack)   . Hypertension   . Diverticulitis   . Cancer ([place])     pancreatic ca  . GERD (gastroesophageal reflux disease)   . History of hiatal hernia   . History of colonic polyps     Past Surgical History  Procedure Laterality Date  . Hernia repair    . Ercp N/A 02/09/2016    Procedure: ENDOSCOPIC RETROGRADE CHOLANGIOPANCREATOGRAPHY (ERCP);  Surgeon: Lucilla Lame, MD;  Location: [facility] ENDOSCOPY;  Service: Endoscopy;  Laterality: N/A;  . Skin cancer excision      x 3  . Colonoscopy    . Esophagogastroduodenoscopy    . Portacath placement Left 02/27/2016    Procedure: INSERTION PORT-A-CATH;  Surgeon: Christene Lye, MD;  Location: [facility] ORS;   Service: General;  Laterality: Left;    Family History  Problem Relation Age of Onset  . Cancer Father 80    lung  . Breast cancer Maternal Aunt   . Brain cancer Maternal Aunt     Social History:  reports that he quit smoking about 41 years ago. He has never used smokeless tobacco. He reports that he drinks alcohol. He reports that he does not use illicit drugs.  The patient is accompanied by his x-wife today.  Allergies:  Allergies  Allergen Reactions  . Lodine [Etodolac] Nausea And Vomiting    Current Medications: Current Outpatient Prescriptions  Medication Sig Dispense Refill  . furosemide (LASIX) 20 MG tablet Take 20 mg by mouth daily.     Marland Kitchen lidocaine-prilocaine (EMLA) cream Apply 1 application topically as needed. Apply 1/2 tablespoon 1-2 hours prior port being used and cover 30 g 0  . magnesium hydroxide (MILK OF MAGNESIA) 400 MG/5ML suspension Take 30 mLs by mouth daily as needed for mild constipation.    . megestrol (MEGACE) 400 MG/10ML suspension Take 5 mLs (200 mg total) by mouth daily. 150 mL 1  . modafinil (PROVIGIL) 100 MG tablet Take 1 tablet (100 mg total) by mouth daily. 30 tablet 0  . ondansetron (ZOFRAN-ODT) 4 MG disintegrating tablet Take 1 tablet (4 mg total) by mouth every 6 (six) hours as needed for nausea. Reported on 02/14/2016 30 tablet 2  . oxyCODONE (  OXY IR/ROXICODONE) 5 MG immediate release tablet Take 1 tablet (5 mg total) by mouth every 4 (four) hours as needed for moderate pain. 40 tablet 0  . potassium chloride SA (K-DUR,KLOR-CON) 20 MEQ tablet Take 20 mEq by mouth daily.     . simethicone (MYLICON) 80 MG chewable tablet Chew 80 mg by mouth every 6 (six) hours as needed for flatulence.    Marland Kitchen zolpidem (AMBIEN) 5 MG tablet Take 1 tablet (5 mg total) by mouth at bedtime as needed for sleep. 30 tablet 0   No current facility-administered medications for this visit.   Facility-Administered Medications Ordered in Other Visits  Medication Dose Route  Frequency Provider Last Rate Last Dose  . heparin lock flush 100 unit/mL  500 Units Intravenous Once Lequita Asal, MD      . sodium chloride flush (NS) 0.9 % injection 10 mL  10 mL Intracatheter PRN Lequita Asal, MD   10 mL at 03/14/16 0955  . sodium chloride flush (NS) 0.9 % injection 10 mL  10 mL Intravenous PRN Lequita Asal, MD   10 mL at 03/28/16 0935    Review of Systems:  GENERAL:  Fatigue.  No fevers or sweats.  Weight down 2 pounds in past week. PERFORMANCE STATUS (ECOG):  2 HEENT:  Seasonal allergies.  No visual changes, sore throat, mouth sores or tenderness. Lungs: Shortness of breath.  No cough.  No pleuritic chest pain.  No hemoptysis. Cardiac:  No chest pain, palpitations, orthopnea, or PND. GI:  Appetite 50%.  Occasional nausea.  Epigastric discomfort.  No vomiting, diarrhea, constipation, melena or hematochezia. GU:  No urgency, frequency, dysuria, or hematuria. Musculoskeletal:  Back pain.  No joint pain.  No muscle tenderness. Extremities:  No pain or swelling. Skin:  No rashes or skin changes. Neuro:  General weakness.  Occasional tingling in hands.  No headache, numbness or weakness, balance or coordination issues. Endocrine:  No diabetes, thyroid issues, hot flashes or night sweats. Psych:  No  anxiety. Pain:  Upper abdominal pain. Review of systems:  All other systems reviewed and found to be negative.  Physical Exam: Blood pressure 124/76, pulse 105, temperature 96.9 F (36.1 C), temperature source Tympanic, weight 156 lb 15.5 oz (71.2 kg). GENERAL:  Well developed, well nourished, slightly fatigued appearing gentleman sitting comfortably in the exam room in no acute distress. MENTAL STATUS:  Alert and oriented to person, place and time. HEAD:  Wearing a cap.  Gray hair.  Normocephalic, atraumatic, face symmetric, no Cushingoid features. EYES:  Glasses.  Hazel eyes.  Pupils equal round and reactive to light and accomodation.  No conjunctivitis or  scleral icterus. ENT:  Oropharynx clear without lesion.  Tongue normal. Mucous membranes moist. Tympanic membranes benign. RESPIRATORY:  Clear to auscultation without rales, wheezes or rhonchi. CARDIOVASCULAR:  Regular rate and rhythm without murmur, rub or gallop. ABDOMEN:  Soft, non-tender, with active bowel sounds, and no hepatosplenomegaly.  No masses. SKIN:  No rashes, ulcers or lesions. EXTREMITIES: 2+ left lower extremity edema (increased).  No skin discoloration or tenderness.  No palpable cords. LYMPH NODES: No palpable cervical, supraclavicular, axillary or inguinal adenopathy  NEUROLOGICAL: Unremarkable. PSYCH:  Appropriate.   Infusion on 03/28/2016  Component Date Value Ref Range Status  . WBC 03/28/2016 9.2  3.8 - 10.6 K/uL Final  . RBC 03/28/2016 3.14* 4.40 - 5.90 MIL/uL Final  . Hemoglobin 03/28/2016 9.9* 13.0 - 18.0 g/dL Final  . HCT 03/28/2016 28.9* 40.0 - 52.0 % Final  .  MCV 03/28/2016 92.1  80.0 - 100.0 fL Final  . MCH 03/28/2016 31.6  26.0 - 34.0 pg Final  . MCHC 03/28/2016 34.3  32.0 - 36.0 g/dL Final  . RDW 03/28/2016 14.8* 11.5 - 14.5 % Final  . Platelets 03/28/2016 123* 150 - 440 K/uL Final  . Neutrophils Relative % 03/28/2016 86   Final  . Neutro Abs 03/28/2016 7.9* 1.4 - 6.5 K/uL Final  . Lymphocytes Relative 03/28/2016 5   Final  . Lymphs Abs 03/28/2016 0.5* 1.0 - 3.6 K/uL Final  . Monocytes Relative 03/28/2016 8   Final  . Monocytes Absolute 03/28/2016 0.8  0.2 - 1.0 K/uL Final  . Eosinophils Relative 03/28/2016 0   Final  . Eosinophils Absolute 03/28/2016 0.0  0 - 0.7 K/uL Final  . Basophils Relative 03/28/2016 1   Final  . Basophils Absolute 03/28/2016 0.1  0 - 0.1 K/uL Final  . Sodium 03/28/2016 131* 135 - 145 mmol/L Final  . Potassium 03/28/2016 4.2  3.5 - 5.1 mmol/L Final  . Chloride 03/28/2016 103  101 - 111 mmol/L Final  . CO2 03/28/2016 23  22 - 32 mmol/L Final  . Glucose, Bld 03/28/2016 149* 65 - 99 mg/dL Final  . BUN 03/28/2016 30* 6 - 20  mg/dL Final  . Creatinine, Ser 03/28/2016 1.04  0.61 - 1.24 mg/dL Final  . Calcium 03/28/2016 8.4* 8.9 - 10.3 mg/dL Final  . Total Protein 03/28/2016 6.2* 6.5 - 8.1 g/dL Final  . Albumin 03/28/2016 3.1* 3.5 - 5.0 g/dL Final  . AST 03/28/2016 40  15 - 41 U/L Final  . ALT 03/28/2016 51  17 - 63 U/L Final  . Alkaline Phosphatase 03/28/2016 530* 38 - 126 U/L Final  . Total Bilirubin 03/28/2016 1.2  0.3 - 1.2 mg/dL Final  . GFR calc non Af Amer 03/28/2016 >60  >60 mL/min Final  . GFR calc Af Amer 03/28/2016 >60  >60 mL/min Final   Comment: (NOTE) The eGFR has been calculated using the CKD EPI equation. This calculation has not been validated in all clinical situations. eGFR's persistently <60 mL/min signify possible Chronic Kidney Disease.   . Anion gap 03/28/2016 5  5 - 15 Final    Assessment:  Cody Andrade is a 82 y.o. male with metastatic pancreatic cancer. He presented on 02/08/2016 with fatigue, 20 pound weight loss, abdominal discomfort and jaundice (bilirubin 9.3). ERCP on 02/09/2016 revealed segmental biliary stricture. The upper third of the main bile duct was dilated. A biliary sphincterotomy was performed and a metal stent placed. Left supraclavicular lymph node biopsy on 03/04/2016 revealed adenocarcinoma morphologically consistent with a pancreatobiliary origin.  CA19-9 is elevated: 83,355 on 02/10/2016, 227,038 on 02/19/2016, and 353,416 on 03/07/2016.  PET scan on 02/15/2016 revealed a mass in body of pancreas which was intensely hypermetabolic compatible with primary adenocarcinoma of the pancreas. There were multifocal hypermetabolic liver metastases. There was hypermetabolic mediastinal and left supraclavicular lymph nodes compatible with metastatic adenopathy. There were numerous small pulmonary nodules, too small to characterize by PET-CT but suspicious for metastatic disease.  He presented to the emergency room on 02/14/2016 with fever. Abdomen and pelvic CT  scan on 02/14/2016 revealed a 2.3 x 3.2 cm mass at the head/ body of pancreas with pancreatic atrophy and ductal dilatation consistent with adenocarcinoma. There was local infiltration into the peripancreatic fat and celiac axis. There were multiple hepatic metastases. There were indeterminate nodules in the left lung base. There was a biliary stent with associated pneumobilia.  Cultures were negative. He received Rocephin and was discharged on Levaquin.  He developed thrombocytopenia (platelet count 77,000) on 02/19/2016. Work-up reveals the following normal labs: PTT, fibrinogen, creatinine, ANA, hepatitis B surface antigen, hepatitis B core total antibody, and hepatitis C antibody. Hepatin induced thrombocytopenia (HIT) testing and serotonin release assay were negative. Platelet count in a blue top tube ruled out pseudo-thrombocytopenia.   He has progressive normocytic anemia.  Work-up on 03/07/2016 revealed an elvated ferritin (516) and iron studies with a saturation of 54% and TIBC 188.  B12 was 1695 and folate 12.3.  TSH was 0.129 (0.350-4.5).  Free T4 was 2.34 (0.61-1.12) on 03/14/2016.  He has chronic left lower extremity edema.  Left lower extremity duplex on 02/24/2016 revealed no evidence of DVT.  Thyroid function studies on 03/08/2016 revealed a TSH of 0.129 (low) and a free T4 of 2.34 (0.61-1.12) c/w hyperthyroidism.  He has been prescribed methimazole.  He is currently day 15 of cycle #1 gemcitabine (03/07/2016).  Chemotherapy was held on 03/14/2016 for a platelet count of 59,000.  Symptomatically, he remains fatigued. He has new shortness of breath.  He has lost 2 pounds. Exam reveals slight increase in left lower extremity edema.  Plan: 1.  Labs today:  CBC with diff, CMP. 2.  Day 15 gemcitabine. 3.  Chest CT angiogram today. 4.  If chest CT angiogram negative, left lower extremity duplex tomorrow. 5.  Discuss importance of caloric intake. 6.  RTC in 1 week for labs (CBC  with diff). 7.  RTC in 2 weeks for MD assessment, labs (CBC with diff, CMP, Mg), and day 1 of cycle #2 gemcitabine and Abraxane.  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  There was no evidence of acute pulmonary embolism   Lequita Asal, MD  03/28/2016

## 2016-03-28 NOTE — Progress Notes (Signed)
Patient ambulates via wheelchair, accompanied by his wife, vitals documented.  Patient states weight is down by lbs since last visit.  Medication record updated, information provided by patient

## 2016-03-29 ENCOUNTER — Telehealth: Payer: Self-pay | Admitting: *Deleted

## 2016-03-29 ENCOUNTER — Inpatient Hospital Stay (HOSPITAL_BASED_OUTPATIENT_CLINIC_OR_DEPARTMENT_OTHER): Payer: Medicare Other | Admitting: Hematology and Oncology

## 2016-03-29 ENCOUNTER — Inpatient Hospital Stay: Payer: Medicare Other

## 2016-03-29 ENCOUNTER — Ambulatory Visit
Admission: RE | Admit: 2016-03-29 | Discharge: 2016-03-29 | Disposition: A | Discharge status: Home / Self Care | Payer: Medicare Other | Source: Ambulatory Visit | Attending: Hematology and Oncology | Admitting: Hematology and Oncology

## 2016-03-29 VITALS — BP 144/83 | HR 99 | Temp 95.9°F | Resp 18 | Ht 71.0 in | Wt 158.1 lb

## 2016-03-29 DIAGNOSIS — C778 Secondary and unspecified malignant neoplasm of lymph nodes of multiple regions: Secondary | ICD-10-CM

## 2016-03-29 DIAGNOSIS — R978 Other abnormal tumor markers: Secondary | ICD-10-CM

## 2016-03-29 DIAGNOSIS — D696 Thrombocytopenia, unspecified: Secondary | ICD-10-CM

## 2016-03-29 DIAGNOSIS — C251 Malignant neoplasm of body of pancreas: Secondary | ICD-10-CM

## 2016-03-29 DIAGNOSIS — C787 Secondary malignant neoplasm of liver and intrahepatic bile duct: Secondary | ICD-10-CM

## 2016-03-29 DIAGNOSIS — I82412 Acute embolism and thrombosis of left femoral vein: Secondary | ICD-10-CM | POA: Diagnosis not present

## 2016-03-29 DIAGNOSIS — C250 Malignant neoplasm of head of pancreas: Secondary | ICD-10-CM | POA: Insufficient documentation

## 2016-03-29 DIAGNOSIS — Z5111 Encounter for antineoplastic chemotherapy: Secondary | ICD-10-CM | POA: Diagnosis not present

## 2016-03-29 DIAGNOSIS — M79605 Pain in left leg: Secondary | ICD-10-CM | POA: Diagnosis present

## 2016-03-29 DIAGNOSIS — I82402 Acute embolism and thrombosis of unspecified deep veins of left lower extremity: Secondary | ICD-10-CM

## 2016-03-29 DIAGNOSIS — M7989 Other specified soft tissue disorders: Secondary | ICD-10-CM

## 2016-03-29 DIAGNOSIS — M79602 Pain in left arm: Secondary | ICD-10-CM | POA: Diagnosis present

## 2016-03-29 DIAGNOSIS — Z79899 Other long term (current) drug therapy: Secondary | ICD-10-CM

## 2016-03-29 DIAGNOSIS — D649 Anemia, unspecified: Secondary | ICD-10-CM

## 2016-03-29 LAB — PROTIME-INR
INR: 1.17
Prothrombin Time: 15.1 seconds — ABNORMAL HIGH (ref 11.4–15.0)

## 2016-03-29 NOTE — Progress Notes (Signed)
Pt in Wheel chair today says short distances makes him SOB.  Reports swelling in left leg and has pitting edema currently no pain unless pressing on it in certain areas.  No fevers.

## 2016-03-29 NOTE — Telephone Encounter (Signed)
Asking about lab results and whether to start med. Per Dr Mike Gip, Labs are good and he is to go ahead and start Xarelto. [name] notified and acknowledged he is to start Xarelto

## 2016-03-29 NOTE — Progress Notes (Signed)
[facility] day:  03/29/2016   Chief Complaint: Cody Andrade is a 82 y.o. male with metastatic pancreatic cancer who is seen for review of interval left lower extremity duplex and initiation of anticoagulation.  HPI:  The patient was last seen in the medical oncology clinic on 03/28/2016.  At that time, he received day 15 of cycle #1 gemcitabine.  He noted some shortness of breath.  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  There was no evidence of acute pulmonary embolism  At last visit, he had increased left lower extremity edema.  He previously had 2 left lower extremity duplexes without evidence of clot.  Left lower extremity duplex today revealed an acute non-occlusive thrombosis within the left common and superficial femoral veins. Additionally, there was occlusive thrombus within the left peroneal vein.    Past Medical History  Diagnosis Date  . TIA (transient ischemic attack)   . Hypertension   . Diverticulitis   . Cancer ([place])     pancreatic ca  . GERD (gastroesophageal reflux disease)   . History of hiatal hernia   . History of colonic polyps     Past Surgical History  Procedure Laterality Date  . Hernia repair    . Ercp N/A 02/09/2016    Procedure: ENDOSCOPIC RETROGRADE CHOLANGIOPANCREATOGRAPHY (ERCP);  Surgeon: Lucilla Lame, MD;  Location: [facility] ENDOSCOPY;  Service: Endoscopy;  Laterality: N/A;  . Skin cancer excision      x 3  . Colonoscopy    . Esophagogastroduodenoscopy    . Portacath placement Left 02/27/2016    Procedure: INSERTION PORT-A-CATH;  Surgeon: Christene Lye, MD;  Location: [facility] ORS;  Service: General;  Laterality: Left;    Family History  Problem Relation Age of Onset  . Cancer Father 76    lung  .  Breast cancer Maternal Aunt   . Brain cancer Maternal Aunt     Social History:  reports that he quit smoking about 41 years ago. He has never used smokeless tobacco. He reports that he drinks alcohol. He reports that he does not use illicit drugs.  The patient is accompanied by his x-wife and son, Cody Andrade, today.  Allergies:  Allergies  Allergen Reactions  . Lodine [Etodolac] Nausea And Vomiting    Current Medications: Current Outpatient Prescriptions  Medication Sig Dispense Refill  . furosemide (LASIX) 20 MG tablet Take 20 mg by mouth daily.     Marland Kitchen lidocaine-prilocaine (EMLA) cream Apply 1 application topically as needed. Apply 1/2 tablespoon 1-2 hours prior port being used and cover 30 g 0  . magnesium hydroxide (MILK OF MAGNESIA) 400 MG/5ML suspension Take 30 mLs by mouth daily as needed for mild constipation.    . megestrol (MEGACE) 400 MG/10ML suspension Take 5 mLs (200 mg total) by mouth daily. 150 mL 1  . modafinil (PROVIGIL) 100 MG tablet Take 1 tablet (100 mg total) by mouth daily. 30 tablet 0  . ondansetron (ZOFRAN-ODT) 4 MG disintegrating tablet Take 1 tablet (4 mg total) by mouth every 6 (six) hours as needed for nausea. Reported on 02/14/2016 30 tablet 2  . oxyCODONE (OXY IR/ROXICODONE) 5 MG immediate release tablet Take 1 tablet (5 mg total) by mouth every 4 (four) hours as needed for moderate pain. 40 tablet 0  . potassium chloride SA (  K-DUR,KLOR-CON) 20 MEQ tablet Take 20 mEq by mouth daily.     . simethicone (MYLICON) 80 MG chewable tablet Chew 80 mg by mouth every 6 (six) hours as needed for flatulence.    Marland Kitchen zolpidem (AMBIEN) 5 MG tablet Take 1 tablet (5 mg total) by mouth at bedtime as needed for sleep. 30 tablet 0   No current facility-administered medications for this visit.   Facility-Administered Medications Ordered in Other Visits  Medication Dose Route Frequency Provider Last Rate Last Dose  . sodium chloride flush (NS) 0.9 % injection 10 mL  10 mL Intracatheter PRN  Lequita Asal, MD   10 mL at 03/14/16 0955    Review of Systems:  GENERAL:  Fatigue, slightly improved.  No fevers or sweats.  Weight up 2 pounds in past week. PERFORMANCE STATUS (ECOG):  1-2 HEENT:  Seasonal allergies.  No visual changes, sore throat, mouth sores or tenderness. Lungs: No shortness of breath or cough.  No hemoptysis. Cardiac:  No chest pain, palpitations, orthopnea, or PND. GI:  Appetite 50%.  Occasional nausea.  Indigestion.  Epigastric discomfort.  No vomiting, diarrhea, constipation, melena or hematochezia. GU:  No urgency, frequency, dysuria, or hematuria. Musculoskeletal:  Back pain.  No joint pain.  No muscle tenderness. Extremities:  No pain or swelling. Skin:  No rashes or skin changes. Neuro:  General weakness.  Occasional tingling in hands.  No headache, numbness or weakness, balance or coordination issues. Endocrine:  No diabetes, thyroid issues, hot flashes or night sweats. Psych:  No  anxiety. Pain:  Upper abdominal pain. Review of systems:  All other systems reviewed and found to be negative.  Physical Exam: There were no vitals taken for this visit. GENERAL:  Well developed, well nourished, sitting comfortably in the exam room in no acute distress. MENTAL STATUS:  Alert and oriented to person, place and time. HEAD:  Pearline Cables hair.  Normocephalic, atraumatic, face symmetric, no Cushingoid features. EYES:  Glasses.  Hazel eyes.  Pupils equal round and reactive to light and accomodation.  No conjunctivitis or scleral icterus. ENT:  Oropharynx clear without lesion.  Tongue normal. Mucous membranes moist. Tympanic membranes benign. RESPIRATORY:  Clear to auscultation without rales, wheezes or rhonchi. CARDIOVASCULAR:  Regular rate and rhythm without murmur, rub or gallop. ABDOMEN:  Soft, non-tender, with active bowel sounds, and no hepatosplenomegaly.  No masses. SKIN:  No rashes, ulcers or lesions. EXTREMITIES: Mild left lower extremity edema (chronic).  No  skin discoloration or tenderness.  No palpable cords. LYMPH NODES: No palpable cervical, supraclavicular, axillary or inguinal adenopathy  NEUROLOGICAL: Unremarkable. PSYCH:  Appropriate.   Infusion on 03/28/2016  Component Date Value Ref Range Status  . WBC 03/28/2016 9.2  3.8 - 10.6 K/uL Final  . RBC 03/28/2016 3.14* 4.40 - 5.90 MIL/uL Final  . Hemoglobin 03/28/2016 9.9* 13.0 - 18.0 g/dL Final  . HCT 03/28/2016 28.9* 40.0 - 52.0 % Final  . MCV 03/28/2016 92.1  80.0 - 100.0 fL Final  . MCH 03/28/2016 31.6  26.0 - 34.0 pg Final  . MCHC 03/28/2016 34.3  32.0 - 36.0 g/dL Final  . RDW 03/28/2016 14.8* 11.5 - 14.5 % Final  . Platelets 03/28/2016 123* 150 - 440 K/uL Final  . Neutrophils Relative % 03/28/2016 86   Final  . Neutro Abs 03/28/2016 7.9* 1.4 - 6.5 K/uL Final  . Lymphocytes Relative 03/28/2016 5   Final  . Lymphs Abs 03/28/2016 0.5* 1.0 - 3.6 K/uL Final  . Monocytes Relative 03/28/2016 8  Final  . Monocytes Absolute 03/28/2016 0.8  0.2 - 1.0 K/uL Final  . Eosinophils Relative 03/28/2016 0   Final  . Eosinophils Absolute 03/28/2016 0.0  0 - 0.7 K/uL Final  . Basophils Relative 03/28/2016 1   Final  . Basophils Absolute 03/28/2016 0.1  0 - 0.1 K/uL Final  . Sodium 03/28/2016 131* 135 - 145 mmol/L Final  . Potassium 03/28/2016 4.2  3.5 - 5.1 mmol/L Final  . Chloride 03/28/2016 103  101 - 111 mmol/L Final  . CO2 03/28/2016 23  22 - 32 mmol/L Final  . Glucose, Bld 03/28/2016 149* 65 - 99 mg/dL Final  . BUN 03/28/2016 30* 6 - 20 mg/dL Final  . Creatinine, Ser 03/28/2016 1.04  0.61 - 1.24 mg/dL Final  . Calcium 03/28/2016 8.4* 8.9 - 10.3 mg/dL Final  . Total Protein 03/28/2016 6.2* 6.5 - 8.1 g/dL Final  . Albumin 03/28/2016 3.1* 3.5 - 5.0 g/dL Final  . AST 03/28/2016 40  15 - 41 U/L Final  . ALT 03/28/2016 51  17 - 63 U/L Final  . Alkaline Phosphatase 03/28/2016 530* 38 - 126 U/L Final  . Total Bilirubin 03/28/2016 1.2  0.3 - 1.2 mg/dL Final  . GFR calc non Af Amer 03/28/2016  >60  >60 mL/min Final  . GFR calc Af Amer 03/28/2016 >60  >60 mL/min Final   Comment: (NOTE) The eGFR has been calculated using the CKD EPI equation. This calculation has not been validated in all clinical situations. eGFR's persistently <60 mL/min signify possible Chronic Kidney Disease.   . Anion gap 03/28/2016 5  5 - 15 Final    Assessment:  THORN DEMAS is a 82 y.o. male with metastatic pancreatic cancer. He presented on 02/08/2016 with fatigue, 20 pound weight loss, abdominal discomfort and jaundice (bilirubin 9.3). ERCP on 02/09/2016 revealed segmental biliary stricture. The upper third of the main bile duct was dilated. A biliary sphincterotomy was performed and a metal stent placed. Left supraclavicular lymph node biopsy on 03/04/2016 revealed adenocarcinoma morphologically consistent with a pancreatobiliary origin.  CA19-9 is elevated: 83,355 on 02/10/2016, 227,038 on 02/19/2016, and 353,416 on 03/07/2016.  PET scan on 02/15/2016 revealed a mass in body of pancreas which was intensely hypermetabolic compatible with primary adenocarcinoma of the pancreas. There were multifocal hypermetabolic liver metastases. There was hypermetabolic mediastinal and left supraclavicular lymph nodes compatible with metastatic adenopathy. There were numerous small pulmonary nodules, too small to characterize by PET-CT but suspicious for metastatic disease.  He presented to the emergency room on 02/14/2016 with fever. Abdomen and pelvic CT scan on 02/14/2016 revealed a 2.3 x 3.2 cm mass at the head/ body of pancreas with pancreatic atrophy and ductal dilatation consistent with adenocarcinoma. There was local infiltration into the peripancreatic fat and celiac axis. There were multiple hepatic metastases. There were indeterminate nodules in the left lung base. There was a biliary stent with associated pneumobilia. Cultures were negative. He received Rocephin and was discharged on  Levaquin.  He developed thrombocytopenia (platelet count 77,000) on 02/19/2016. Work-up reveals the following normal labs: PTT, fibrinogen, creatinine, ANA, hepatitis B surface antigen, hepatitis B core total antibody, and hepatitis C antibody. Hepatin induced thrombocytopenia (HIT) testing and serotonin release assay were negative. Platelet count in a blue top tube ruled out pseudo-thrombocytopenia.   He has progressive normocytic anemia.  Work-up on 03/07/2016 revealed an elvated ferritin (516) and iron studies with a saturation of 54% and TIBC 188.  B12 was 1695 and folate 12.3.  TSH  was 0.129 (0.350-4.5).  Free T4 was 2.34 (0.61-1.12) on 03/14/2016.  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 There was no evidence of pulmonary embolism.  Left lower extremity duplex on 03/29/2016 revealed an acute non-occlusive thrombosis within the left common and superficial femoral veins.  There was occlusive thrombus within the left peroneal vein.   Thyroid function studies on 03/08/2016 revealed a TSH of 0.129 (low) and a free T4 of 2.34 (0.61-1.12) c/w hyperthyroidism.  He has been prescribed methimazole.  He is currently day 16 of cycle #1 gemcitabine (03/07/2016).  Chemotherapy was held on 03/14/2016 for a platelet count of 59,000.  Symptomatically, he is has worsening left lower extremity edema.  Plan: 1.  Labs today:  PT/INR. 2.  Discuss results of duplex study.  Discuss anticoagulation choices (Lovenox, Arixtra, Lovenox bridge to Coumadin, Xarelto, Eliquis).  Discuss shots (Lovenox and Arixtra).  Discuss checking INRs with Coumadin.  Discus diet, medication interactions, and montoring.  Discuss at least 5 days overlap with Lovenox until INR therapeutic (INR 2-3).  Discuss ease of reversal  with vitamin K or FFP.  Discuss factor Xa inhibitors.  Discuss issues with reversal.  Discuss issues with liver dysfunction.  Currently patient is a Child-Pugh class A.  Patient desires treatment with Xarelto. 3.  Patient given Xarelto starter pack: 15 mg po BID x 3 weeks then 20 mg a day.  Dis:  1 month supply. 4.  Patient to be called at home once INR back to initiate therapy. 5.  RTC as previously scheduled.  Addendum:  INR was 1.17.  Patient called to initiate Xarelto.   Lequita Asal, MD  03/29/2016

## 2016-03-29 NOTE — Telephone Encounter (Signed)
Patient has a nonocclusive DVT in the left common femoral, proximal and medial veins. Dr Mike Gip notified and patient was advised to come to office from [place] dept

## 2016-03-30 ENCOUNTER — Encounter: Payer: Self-pay | Admitting: Hematology and Oncology

## 2016-04-04 ENCOUNTER — Observation Stay: Payer: Medicare Other

## 2016-04-04 ENCOUNTER — Inpatient Hospital Stay
Admission: EM | Admit: 2016-04-04 | Discharge: 2016-04-06 | DRG: 065 | Disposition: A | Discharge status: Home / Self Care | Payer: Medicare Other | Attending: Internal Medicine | Admitting: Internal Medicine

## 2016-04-04 ENCOUNTER — Other Ambulatory Visit: Payer: Self-pay

## 2016-04-04 ENCOUNTER — Inpatient Hospital Stay: Payer: Medicare Other

## 2016-04-04 ENCOUNTER — Encounter: Payer: Self-pay | Admitting: Emergency Medicine

## 2016-04-04 ENCOUNTER — Emergency Department: Payer: Medicare Other

## 2016-04-04 DIAGNOSIS — Z79891 Long term (current) use of opiate analgesic: Secondary | ICD-10-CM

## 2016-04-04 DIAGNOSIS — C787 Secondary malignant neoplasm of liver and intrahepatic bile duct: Secondary | ICD-10-CM | POA: Diagnosis present

## 2016-04-04 DIAGNOSIS — Z7901 Long term (current) use of anticoagulants: Secondary | ICD-10-CM

## 2016-04-04 DIAGNOSIS — Z86718 Personal history of other venous thrombosis and embolism: Secondary | ICD-10-CM

## 2016-04-04 DIAGNOSIS — I63331 Cerebral infarction due to thrombosis of right posterior cerebral artery: Secondary | ICD-10-CM | POA: Diagnosis not present

## 2016-04-04 DIAGNOSIS — I639 Cerebral infarction, unspecified: Secondary | ICD-10-CM | POA: Diagnosis not present

## 2016-04-04 DIAGNOSIS — K219 Gastro-esophageal reflux disease without esophagitis: Secondary | ICD-10-CM | POA: Diagnosis present

## 2016-04-04 DIAGNOSIS — C7800 Secondary malignant neoplasm of unspecified lung: Secondary | ICD-10-CM | POA: Diagnosis present

## 2016-04-04 DIAGNOSIS — E039 Hypothyroidism, unspecified: Secondary | ICD-10-CM | POA: Diagnosis present

## 2016-04-04 DIAGNOSIS — Z87891 Personal history of nicotine dependence: Secondary | ICD-10-CM

## 2016-04-04 DIAGNOSIS — Z8507 Personal history of malignant neoplasm of pancreas: Secondary | ICD-10-CM

## 2016-04-04 DIAGNOSIS — I10 Essential (primary) hypertension: Secondary | ICD-10-CM | POA: Diagnosis present

## 2016-04-04 DIAGNOSIS — Z79899 Other long term (current) drug therapy: Secondary | ICD-10-CM

## 2016-04-04 DIAGNOSIS — Z86010 Personal history of colonic polyps: Secondary | ICD-10-CM

## 2016-04-04 DIAGNOSIS — D649 Anemia, unspecified: Secondary | ICD-10-CM | POA: Diagnosis present

## 2016-04-04 DIAGNOSIS — Z888 Allergy status to other drugs, medicaments and biological substances status: Secondary | ICD-10-CM

## 2016-04-04 LAB — DIFFERENTIAL
Basophils Absolute: 0.1 10*3/uL (ref 0–0.1)
Basophils Relative: 1 %
EOS PCT: 1 %
Eosinophils Absolute: 0.1 10*3/uL (ref 0–0.7)
LYMPHS ABS: 0.5 10*3/uL — AB (ref 1.0–3.6)
LYMPHS PCT: 5 %
Monocytes Absolute: 0.8 10*3/uL (ref 0.2–1.0)
Monocytes Relative: 8 %
NEUTROS ABS: 7.9 10*3/uL — AB (ref 1.4–6.5)
NEUTROS PCT: 85 %

## 2016-04-04 LAB — URINE DRUG SCREEN, QUALITATIVE (ARMC ONLY)
AMPHETAMINES, UR SCREEN: NOT DETECTED
Barbiturates, Ur Screen: NOT DETECTED
Benzodiazepine, Ur Scrn: NOT DETECTED
Cannabinoid 50 Ng, Ur ~~LOC~~: NOT DETECTED
Cocaine Metabolite,Ur ~~LOC~~: NOT DETECTED
MDMA (ECSTASY) UR SCREEN: NOT DETECTED
METHADONE SCREEN, URINE: NOT DETECTED
OPIATE, UR SCREEN: NOT DETECTED
Phencyclidine (PCP) Ur S: NOT DETECTED
Tricyclic, Ur Screen: NOT DETECTED

## 2016-04-04 LAB — PROTIME-INR
INR: 2.72
PROTHROMBIN TIME: 28.4 s — AB (ref 11.4–15.0)

## 2016-04-04 LAB — COMPREHENSIVE METABOLIC PANEL
ALBUMIN: 3.1 g/dL — AB (ref 3.5–5.0)
ALK PHOS: 511 U/L — AB (ref 38–126)
ALT: 50 U/L (ref 17–63)
ANION GAP: 10 (ref 5–15)
AST: 43 U/L — AB (ref 15–41)
BILIRUBIN TOTAL: 1 mg/dL (ref 0.3–1.2)
BUN: 24 mg/dL — AB (ref 6–20)
CALCIUM: 8.6 mg/dL — AB (ref 8.9–10.3)
CO2: 21 mmol/L — AB (ref 22–32)
CREATININE: 1 mg/dL (ref 0.61–1.24)
Chloride: 103 mmol/L (ref 101–111)
GFR calc Af Amer: >60 mL/min (ref >60)
GFR calc non Af Amer: >60 mL/min (ref >60)
GLUCOSE: 118 mg/dL — AB (ref 65–99)
Potassium: 3.9 mmol/L (ref 3.5–5.1)
SODIUM: 134 mmol/L — AB (ref 135–145)
TOTAL PROTEIN: 6.1 g/dL — AB (ref 6.5–8.1)

## 2016-04-04 LAB — CBC
HCT: 31 % — ABNORMAL LOW (ref 40.0–52.0)
HEMOGLOBIN: 10.3 g/dL — AB (ref 13.0–18.0)
MCH: 31.2 pg (ref 26.0–34.0)
MCHC: 33.2 g/dL (ref 32.0–36.0)
MCV: 94.1 fL (ref 80.0–100.0)
PLATELETS: 139 10*3/uL — AB (ref 150–440)
RBC: 3.3 MIL/uL — AB (ref 4.40–5.90)
RDW: 16.2 % — ABNORMAL HIGH (ref 11.5–14.5)
WBC: 9.3 10*3/uL (ref 3.8–10.6)

## 2016-04-04 LAB — APTT: aPTT: 38 seconds — ABNORMAL HIGH (ref 24–36)

## 2016-04-04 MED ORDER — MAGNESIUM HYDROXIDE 400 MG/5ML PO SUSP: 30.0000 mL | Freq: Every day | ORAL | Status: DC | PRN

## 2016-04-04 MED ORDER — MEGESTROL ACETATE 400 MG/10ML PO SUSP
200.0000 mg | Freq: Every day | ORAL | Status: DC
  Administered 2016-04-04 – 2016-04-06 (×3): 200 mg via ORAL
  Filled 2016-04-04: qty 5
  Filled 2016-04-04 (×2): qty 10

## 2016-04-04 MED ORDER — METHIMAZOLE 5 MG PO TABS
5.0000 mg | ORAL_TABLET | Freq: Every day | ORAL | Status: DC
  Administered 2016-04-04 – 2016-04-06 (×3): 5 mg via ORAL
  Filled 2016-04-04 (×3): qty 1

## 2016-04-04 MED ORDER — MORPHINE SULFATE (PF) 2 MG/ML IV SOLN: 2.0000 mg | INTRAVENOUS | Status: DC | PRN

## 2016-04-04 MED ORDER — ACETAMINOPHEN 650 MG RE SUPP: 650.0000 mg | Freq: Four times a day (QID) | RECTAL | Status: DC | PRN

## 2016-04-04 MED ORDER — SIMETHICONE 80 MG PO CHEW
80.0000 mg | CHEWABLE_TABLET | Freq: Four times a day (QID) | ORAL | Status: DC | PRN
  Filled 2016-04-04: qty 1

## 2016-04-04 MED ORDER — ASPIRIN 325 MG PO TABS
325.0000 mg | ORAL_TABLET | Freq: Every day | ORAL | Status: DC
  Administered 2016-04-04 – 2016-04-06 (×3): 325 mg via ORAL
  Filled 2016-04-04 (×3): qty 1

## 2016-04-04 MED ORDER — ASPIRIN 300 MG RE SUPP: 300.0000 mg | Freq: Every day | RECTAL | Status: DC

## 2016-04-04 MED ORDER — OXYCODONE HCL 5 MG PO TABS
5.0000 mg | ORAL_TABLET | ORAL | Status: DC | PRN
  Administered 2016-04-05: 5 mg via ORAL
  Filled 2016-04-04: qty 1

## 2016-04-04 MED ORDER — STROKE: EARLY STAGES OF RECOVERY BOOK
Freq: Once | Status: AC
  Administered 2016-04-04: 14:00:00

## 2016-04-04 MED ORDER — ZOLPIDEM TARTRATE 5 MG PO TABS
5.0000 mg | ORAL_TABLET | Freq: Every evening | ORAL | Status: DC | PRN
  Administered 2016-04-05: 22:00:00 5 mg via ORAL
  Filled 2016-04-04: qty 1

## 2016-04-04 MED ORDER — ATORVASTATIN CALCIUM 20 MG PO TABS
80.0000 mg | ORAL_TABLET | Freq: Every day | ORAL | Status: DC
  Administered 2016-04-04 – 2016-04-05 (×2): 80 mg via ORAL
  Filled 2016-04-04 (×2): qty 4

## 2016-04-04 MED ORDER — SODIUM CHLORIDE 0.9% FLUSH
3.0000 mL | Freq: Two times a day (BID) | INTRAVENOUS | Status: DC
  Administered 2016-04-04 – 2016-04-06 (×5): 3 mL via INTRAVENOUS

## 2016-04-04 MED ORDER — DIAZEPAM 2 MG PO TABS
2.0000 mg | ORAL_TABLET | Freq: Once | ORAL | Status: AC
  Administered 2016-04-04: 16:00:00 2 mg via ORAL
  Filled 2016-04-04: qty 1

## 2016-04-04 MED ORDER — RIVAROXABAN 15 MG PO TABS
15.0000 mg | ORAL_TABLET | Freq: Two times a day (BID) | ORAL | Status: DC
  Administered 2016-04-04 – 2016-04-06 (×4): 15 mg via ORAL
  Filled 2016-04-04 (×5): qty 1

## 2016-04-04 MED ORDER — ACETAMINOPHEN 325 MG PO TABS
650.0000 mg | ORAL_TABLET | Freq: Four times a day (QID) | ORAL | Status: DC | PRN
Start: 2016-04-04 — End: 2016-04-06

## 2016-04-04 MED ORDER — IOPAMIDOL (ISOVUE-370) INJECTION 76%
75.0000 mL | Freq: Once | INTRAVENOUS | Status: AC | PRN
  Administered 2016-04-04: 75 mL via INTRAVENOUS

## 2016-04-04 NOTE — ED Provider Notes (Signed)
[facility] Emergency Department Provider Note  ____________________________________________  Time seen: Approximately 11:37 AM  I have reviewed the triage vital signs and the nursing notes.   HISTORY  Chief Complaint Loss of Vision    HPI Cody Andrade is a 82 y.o. male who reports he went to bed with normal vision last night woke up this morning unable to see out of the left visual field of his left eye. Patient reports a history of TIAs since 1991 but all his symptoms have always resolved. Patient also has a DVT in his left leg with swelling for which she takes around toe 50 mg twice a day. Patient also has a history of pancreatic cancer. Patient has no other neurological deficits except for the visual field cut at this time. Physical examination shows a visual field cut is bilateral.  Past Medical History  Diagnosis Date  . TIA (transient ischemic attack)   . Hypertension   . Diverticulitis   . Cancer ([place])     pancreatic ca  . GERD (gastroesophageal reflux disease)   . History of hiatal hernia   . History of colonic polyps     Patient Active Problem List   Diagnosis Date Noted  . CVA (cerebral infarction) 04/04/2016  . Left femoral vein DVT ([place]) 03/29/2016  . Neoplastic malignant related fatigue 03/14/2016  . Hyperthyroidism 03/08/2016  . Neoplasm related pain 03/07/2016  . Insomnia 03/07/2016  . Malignant neoplasm of head of pancreas ([place]) 02/23/2016  . Thrombocytopenia ([place]) 02/19/2016  . Pancreatic mass 02/15/2016  . Pancreatic cancer ([place]) 02/15/2016  . Protein-calorie malnutrition, severe 02/09/2016  . Neoplasm of digestive system   . Acquired hyperbilirubinemia   . Obstruction of bile duct   . Obstructive jaundice 02/08/2016  . Liver lesion 02/08/2016    Past Surgical History  Procedure Laterality Date  . Hernia repair    . Ercp N/A 02/09/2016    Procedure: ENDOSCOPIC RETROGRADE CHOLANGIOPANCREATOGRAPHY (ERCP);  Surgeon: Lucilla Lame, MD;  Location: [facility] ENDOSCOPY;  Service: Endoscopy;  Laterality: N/A;  . Skin cancer excision      x 3  . Colonoscopy    . Esophagogastroduodenoscopy    . Portacath placement Left 02/27/2016    Procedure: INSERTION PORT-A-CATH;  Surgeon: Christene Lye, MD;  Location: [facility] ORS;  Service: General;  Laterality: Left;    Current Outpatient Rx  Name  Route  Sig  Dispense  Refill  . lidocaine-prilocaine (EMLA) cream   Topical   Apply 1 application topically as needed. Apply 1/2 tablespoon 1-2 hours prior port being used and cover   30 g   0   . magnesium hydroxide (MILK OF MAGNESIA) 400 MG/5ML suspension   Oral   Take 30 mLs by mouth daily as needed for mild constipation.         . megestrol (MEGACE) 400 MG/10ML suspension   Oral   Take 5 mLs (200 mg total) by mouth daily.   150 mL   1   . methimazole (TAPAZOLE) 10 MG tablet   Oral   Take 5 mg by mouth daily. Reported on 03/29/2016         . ondansetron (ZOFRAN-ODT) 4 MG disintegrating tablet   Oral   Take 1 tablet (4 mg total) by mouth every 6 (six) hours as needed for nausea. Reported on 02/14/2016   30 tablet   2   . simethicone (MYLICON) 80 MG chewable tablet   Oral   Chew 80 mg by  mouth every 6 (six) hours as needed for flatulence.         Marland Kitchen zolpidem (AMBIEN) 5 MG tablet   Oral   Take 1 tablet (5 mg total) by mouth at bedtime as needed for sleep.   30 tablet   0   . furosemide (LASIX) 20 MG tablet   Oral   Take 20 mg by mouth daily.          Marland Kitchen oxyCODONE (OXY IR/ROXICODONE) 5 MG immediate release tablet   Oral   Take 1 tablet (5 mg total) by mouth every 4 (four) hours as needed for moderate pain.   40 tablet   0   . potassium chloride SA (K-DUR,KLOR-CON) 20 MEQ tablet   Oral   Take 20 mEq by mouth daily.            Allergies Lodine  Family History  Problem Relation Age of Onset  . Cancer Father 56    lung  . Breast cancer Maternal Aunt   . Brain cancer Maternal Aunt      Social History Social History  Substance Use Topics  . Smoking status: Former Smoker -- 25 years    Quit date: 12/09/1974  . Smokeless tobacco: Never Used  . Alcohol Use: 0.0 oz/week    0 Standard drinks or equivalent per week    Review of Systems Constitutional: No fever/chills Eyes:  visual changes. ENT: No sore throat. Cardiovascular: Denies chest pain. Respiratory: Denies shortness of breath. Gastrointestinal: At present No abdominal pain.  No nausea, no vomiting.  Genitourinary: Negative for dysuria. Musculoskeletal: Negative for back pain. Skin: Negative for rash. Neurological: Negative for headaches, focal weakness or numbness.  10-point ROS otherwise negative.  ____________________________________________   PHYSICAL EXAM:  VITAL SIGNS: ED Triage Vitals  Enc Vitals Group     BP 04/04/16 1110 103/68 mmHg     Pulse Rate 04/04/16 1110 106     Resp 04/04/16 1110 16     Temp 04/04/16 1110 97.8 F (36.6 C)     Temp Source 04/04/16 1110 Oral     SpO2 04/04/16 1110 99 %     Weight 04/04/16 1110 155 lb (70.308 kg)     Height 04/04/16 1110 5\' 11"  (1.803 m)     Head Cir --      Peak Flow --      Pain Score 04/04/16 1111 2     Pain Loc --      Pain Edu? --      Excl. in [place]? --     Constitutional: Alert and oriented. Well appearing and in no acute distress. Eyes: Conjunctivae are normal. PERRL. EOMI.Unable to see fundi well on either side Head: Atraumatic. Nose: No congestion/rhinnorhea. Mouth/Throat: Mucous membranes are moist.  Oropharynx non-erythematous. Neck: No stridor.   Cardiovascular: Normal rate, regular rhythm. Grossly normal heart sounds.  Good peripheral circulation. Respiratory: Normal respiratory effort.  No retractions. Lungs CTAB. Gastrointestinal: Soft and nontender. No distention. No abdominal bruits. No CVA tenderness. Musculoskeletal: No lower extremity tenderness nor edema.  No joint effusions. Neurologic:  Normal speech and language.  No gross focal neurologic deficits are appreciated except for bilateral left visual field cut.. . Skin:  Skin is warm, dry and intact. No rash noted.   ____________________________________________   LABS (all labs ordered are listed, but only abnormal results are displayed)  Labs Reviewed  PROTIME-INR - Abnormal; Notable for the following:    Prothrombin Time 28.4 (*)    All other  components within normal limits  APTT - Abnormal; Notable for the following:    aPTT 38 (*)    All other components within normal limits  CBC - Abnormal; Notable for the following:    RBC 3.30 (*)    Hemoglobin 10.3 (*)    HCT 31.0 (*)    RDW 16.2 (*)    Platelets 139 (*)    All other components within normal limits  DIFFERENTIAL - Abnormal; Notable for the following:    Neutro Abs 7.9 (*)    Lymphs Abs 0.5 (*)    All other components within normal limits  COMPREHENSIVE METABOLIC PANEL - Abnormal; Notable for the following:    Sodium 134 (*)    CO2 21 (*)    Glucose, Bld 118 (*)    BUN 24 (*)    Calcium 8.6 (*)    Total Protein 6.1 (*)    Albumin 3.1 (*)    AST 43 (*)    Alkaline Phosphatase 511 (*)    All other components within normal limits  URINE RAPID DRUG SCREEN, HOSP PERFORMED  URINE DRUG SCREEN, QUALITATIVE ([facility] ONLY)   ____________________________________________  EKG  EKG read and interpreted by me shows normal sinus rhythm a rate of 99 left axis occasional PVCs no acute ST-T wave changes there is low voltage in the precordial leads which the computer commented on ____________________________________________  RADIOLOGY  Radiology called The CT report and by phone as showing no acute changes ____________________________________________   PROCEDURES    ____________________________________________   INITIAL IMPRESSION / ASSESSMENT AND PLAN / ED COURSE  Pertinent labs & imaging results that were available during my care of the patient were reviewed by me and considered  in my medical decision making (see chart for details). Dr. Doy Mince and neurologist contacted she will see the patient hospitalist contacted they will admit.  ____________________________________________   FINAL CLINICAL IMPRESSION(S) / ED DIAGNOSES  Final diagnoses:  Cerebral infarction due to unspecified mechanism      Nena Polio, MD 04/04/16 1200

## 2016-04-04 NOTE — ED Notes (Signed)
Patient transported to CT 

## 2016-04-04 NOTE — ED Notes (Addendum)
Pt here with c/o loss of left sided peripheral vision out of left eye. Pt reports he noticed the loss of vision when he woke up this morning; reports vision was normal at 2230 last night when he went to bed. Pt reports a small amount of peripheral vision has returned; hx of TIAs and pancreatic cancer. Pt reports he's on 15mg  of xarelto BID.

## 2016-04-04 NOTE — Consult Note (Signed)
Referring Physician: Cinda Quest    Chief Complaint: Left vision loss  HPI: Cody Andrade is an 82 y.o. male presenting with complaints of not being able to see to the left.  The patient reports going to bed normal.  Awakened this morning with the above complaints.  No other associated neurological complaints.  Initial NIHSS of 1.   Patient has had many TIA's in the past.  Some have involved his speech and others more recently have involved vision causing diplopia or visual field loss.  Symptoms usually last less than 2 hours.    Date last known well: 04/03/2016 Time last known well: Time: 22:30 tPA Given: No: Outside time window, On Xarelto  MRankin: 0  Past Medical History  Diagnosis Date  . TIA (transient ischemic attack)   . Hypertension   . Diverticulitis   . Cancer ([place])     pancreatic ca  . GERD (gastroesophageal reflux disease)   . History of hiatal hernia   . History of colonic polyps     Past Surgical History  Procedure Laterality Date  . Hernia repair    . Ercp N/A 02/09/2016    Procedure: ENDOSCOPIC RETROGRADE CHOLANGIOPANCREATOGRAPHY (ERCP);  Surgeon: Lucilla Lame, MD;  Location: [facility] ENDOSCOPY;  Service: Endoscopy;  Laterality: N/A;  . Skin cancer excision      x 3  . Colonoscopy    . Esophagogastroduodenoscopy    . Portacath placement Left 02/27/2016    Procedure: INSERTION PORT-A-CATH;  Surgeon: Christene Lye, MD;  Location: [facility] ORS;  Service: General;  Laterality: Left;    Family History  Problem Relation Age of Onset  . Cancer Father 9    lung  . Breast cancer Maternal Aunt   . Brain cancer Maternal Aunt    Social History:  reports that he quit smoking about 41 years ago. He has never used smokeless tobacco. He reports that he drinks alcohol. He reports that he does not use illicit drugs.  Allergies:  Allergies  Allergen Reactions  . Lodine [Etodolac] Nausea And Vomiting    Medications: I have reviewed the patient's current  medications. Prior to Admission:  Prior to Admission medications   Medication Sig Start Date End Date Taking? Authorizing Provider  lidocaine-prilocaine (EMLA) cream Apply 1 application topically as needed. Apply 1/2 tablespoon 1-2 hours prior port being used and cover 02/22/16  Yes Lequita Asal, MD  magnesium hydroxide (MILK OF MAGNESIA) 400 MG/5ML suspension Take 30 mLs by mouth daily as needed for mild constipation.   Yes Historical Provider, MD  megestrol (MEGACE) 400 MG/10ML suspension Take 5 mLs (200 mg total) by mouth daily. 03/07/16  Yes Lequita Asal, MD  methimazole (TAPAZOLE) 10 MG tablet Take 5 mg by mouth daily. Reported on 03/29/2016 03/27/16  Yes Historical Provider, MD  ondansetron (ZOFRAN-ODT) 4 MG disintegrating tablet Take 1 tablet (4 mg total) by mouth every 6 (six) hours as needed for nausea. Reported on 02/14/2016 03/07/16  Yes Lequita Asal, MD  oxyCODONE (OXY IR/ROXICODONE) 5 MG immediate release tablet Take 1 tablet (5 mg total) by mouth every 4 (four) hours as needed for moderate pain. 03/07/16  Yes Lequita Asal, MD  Rivaroxaban (XARELTO) 15 MG TABS tablet Take 15 mg by mouth 2 (two) times daily with a meal.   Yes Historical Provider, MD  simethicone (MYLICON) 80 MG chewable tablet Chew 80 mg by mouth every 6 (six) hours as needed for flatulence.   Yes Historical Provider, MD  zolpidem Lorrin Mais)  5 MG tablet Take 1 tablet (5 mg total) by mouth at bedtime as needed for sleep. 03/07/16  Yes Lequita Asal, MD    ROS: History obtained from the patient  General ROS: negative for - chills, fatigue, fever, night sweats, weight gain or weight loss Psychological ROS: negative for - behavioral disorder, hallucinations, memory difficulties, mood swings or suicidal ideation Ophthalmic ROS: as noted in HPI ENT ROS: negative for - epistaxis, nasal discharge, oral lesions, sore throat, tinnitus or vertigo Allergy and Immunology ROS: negative for - hives or  itchy/watery eyes Hematological and Lymphatic ROS: negative for - bleeding problems, bruising or swollen lymph nodes Endocrine ROS: negative for - galactorrhea, hair pattern changes, polydipsia/polyuria or temperature intolerance Respiratory ROS: negative for - cough, hemoptysis, shortness of breath or wheezing Cardiovascular ROS: negative for - chest pain, dyspnea on exertion, edema or irregular heartbeat Gastrointestinal ROS: negative for - abdominal pain, diarrhea, hematemesis, nausea/vomiting or stool incontinence Genito-Urinary ROS: negative for - dysuria, hematuria, incontinence or urinary frequency/urgency Musculoskeletal ROS: left leg swelling Neurological ROS: as noted in HPI Dermatological ROS: negative for rash and skin lesion changes  Physical Examination: Blood pressure 138/75, pulse 83, temperature 98.5 F (36.9 C), temperature source Oral, resp. rate 16, height 5\' 11"  (1.803 m), weight 70.308 kg (155 lb), SpO2 100 %.  HEENT-  Normocephalic, no lesions, without obvious abnormality.  Normal external eye and conjunctiva.  Normal TM's bilaterally.  Normal auditory canals and external ears. Normal external nose, mucus membranes and septum.  Normal pharynx. Cardiovascular- S1, S2 normal, pulses palpable throughout   Lungs- chest clear, no wheezing, rales, normal symmetric air entry Abdomen- soft, non-tender; bowel sounds normal; no masses,  no organomegaly Extremities- LLE edema Lymph-no adenopathy palpable Musculoskeletal-no joint tenderness, deformity or swelling Skin-warm and dry, no hyperpigmentation, vitiligo, or suspicious lesions  Neurological Examination Mental Status: Alert, oriented, thought content appropriate.  Speech fluent without evidence of aphasia.  Able to follow 3 step commands without difficulty. Cranial Nerves: II: Discs flat bilaterally; LHH, pupils equal, round, reactive to light and accommodation III,IV, VI: ptosis not present, extra-ocular motions intact  bilaterally V,VII: smile symmetric, facial light touch sensation normal bilaterally VIII: hearing normal bilaterally IX,X: gag reflex present XI: bilateral shoulder shrug XII: midline tongue extension Motor: Right : Upper extremity   5/5    Left:     Upper extremity   5/5  Lower extremity   5/5     Lower extremity   4+/5 Tone and bulk:normal tone throughout; no atrophy noted Sensory: Pinprick and light touch intact throughout, bilaterally Deep Tendon Reflexes: 2+ and symmetric with absent AJ's bilaterally Plantars: Right: mute   Left: mute Cerebellar: Normal finger-to-nose and normal heel-to-shin testing bilaterally Gait: not tested due to safety concerns      Laboratory Studies:  Basic Metabolic Panel:  Recent Labs Lab 04/04/16 1111  NA 134*  K 3.9  CL 103  CO2 21*  GLUCOSE 118*  BUN 24*  CREATININE 1.00  CALCIUM 8.6*    Liver Function Tests:  Recent Labs Lab 04/04/16 1111  AST 43*  ALT 50  ALKPHOS 511*  BILITOT 1.0  PROT 6.1*  ALBUMIN 3.1*   No results for input(s): LIPASE, AMYLASE in the last 168 hours. No results for input(s): AMMONIA in the last 168 hours.  CBC:  Recent Labs Lab 04/04/16 1111  WBC 9.3  NEUTROABS 7.9*  HGB 10.3*  HCT 31.0*  MCV 94.1  PLT 139*    Cardiac Enzymes: No results  for input(s): CKTOTAL, CKMB, CKMBINDEX, TROPONINI in the last 168 hours.  BNP: Invalid input(s): POCBNP  CBG: No results for input(s): GLUCAP in the last 168 hours.  Microbiology: Results for orders placed or performed during the hospital encounter of 02/23/16  Surgical pcr screen     Status: None   Collection Time: 02/23/16 11:41 AM  Result Value Ref Range Status   MRSA, PCR NEGATIVE NEGATIVE Final   Staphylococcus aureus NEGATIVE NEGATIVE Final    Comment:        The Xpert SA Assay (FDA approved for NASAL specimens in patients over 72 years of age), is one component of a comprehensive surveillance program.  Test performance has been  validated by [facility] for patients greater than or equal to 52 year old. It is not intended to diagnose infection nor to guide or monitor treatment.     Coagulation Studies:  Recent Labs  04/04/16 1111  LABPROT 28.4*  INR 2.72    Urinalysis: No results for input(s): COLORURINE, LABSPEC, PHURINE, GLUCOSEU, HGBUR, BILIRUBINUR, KETONESUR, PROTEINUR, UROBILINOGEN, NITRITE, LEUKOCYTESUR in the last 168 hours.  Invalid input(s): APPERANCEUR  Lipid Panel: No results found for: CHOL, TRIG, HDL, CHOLHDL, VLDL, LDLCALC  HgbA1C: No results found for: HGBA1C  Urine Drug Screen:     Component Value Date/Time   LABOPIA NONE DETECTED 04/04/2016 1140   COCAINSCRNUR NONE DETECTED 04/04/2016 1140   LABBENZ NONE DETECTED 04/04/2016 1140   AMPHETMU NONE DETECTED 04/04/2016 1140   THCU NONE DETECTED 04/04/2016 1140   LABBARB NONE DETECTED 04/04/2016 1140    Alcohol Level: No results for input(s): ETH in the last 168 hours.  Other results: EKG: sinus tachycardia at 99 bpm.  Imaging: Ct Head Wo Contrast  04/04/2016  CLINICAL DATA:  Code stroke EXAM: CT HEAD WITHOUT CONTRAST TECHNIQUE: Contiguous axial images were obtained from the base of the skull through the vertex without intravenous contrast. COMPARISON:  05/08/2013 FINDINGS: No skull fracture is noted. Paranasal sinuses and mastoid air cells are unremarkable. Mild cerebral atrophy. Mild periventricular chronic white matter disease. No definite acute cortical infarction. No mass lesion is noted on this unenhanced scan. Ventricular size is stable from prior exam. IMPRESSION: No acute intracranial abnormality. Mild cerebral atrophy. Mild periventricular chronic white matter disease. No definite acute cortical infarction. These results were called by telephone at the time of interpretation on 04/04/2016 at 11:13 am to Dr. Delman Kitten , who verbally acknowledged these results. Electronically Signed   By: Lahoma Crocker M.D.   On: 04/04/2016 11:13     Assessment: 82 y.o. male presenting with a [facility] that has not resolved as his previous TIA's have/  Head CT personally reviewed and shows no acute changes.  Patient not a tPA candidate due to being on Xarelto for DVT.  Stroke work up recommended.   Stroke Risk Factors - hypertension  Plan: 1. HgbA1c, fasting lipid panel 2. MRI of the brain without contrast 3. PT consult, OT consult, Speech consult 4. Echocardiogram 5. CTA of the head and neck 6. Prophylactic therapy-Continue Xarelto 7. NPO until RN stroke swallow screen 8. Telemetry monitoring 9. Frequent neuro checks  Case discussed with Dr. Agapito Games, MD Neurology [phone] 04/04/2016, 12:46 PM

## 2016-04-04 NOTE — H&P (Signed)
[place] [facility] [place] NAME: Cody Andrade    MR#:  [id number]  DATE OF BIRTH:  [birth year]   DATE OF ADMISSION:  04/04/2016  PRIMARY CARE PHYSICIAN: BABAOFF, Caryl Bis, MD   REQUESTING/REFERRING PHYSICIAN: Malinda  CHIEF COMPLAINT:   Chief Complaint  Patient presents with  . Loss of Vision    HISTORY OF PRESENT ILLNESS:  Cody Andrade  is a 82 y.o. male with a known history of TIA, pancreatic cancer who is presenting with left-sided visual loss. Last known normal 22:30 04/03/2016 , patient woke this morning noticed he had vision loss. Present Hospital further workup and evaluation. Denies further symptomatology denies any focal weakness he thinks that his vision is slightly improving though not yet at baseline.  Of note patient found to have left lower extremity DVT started on Xarelto one week ago  PAST MEDICAL HISTORY:   Past Medical History  Diagnosis Date  . TIA (transient ischemic attack)   . Hypertension   . Diverticulitis   . Cancer ([place])     pancreatic ca  . GERD (gastroesophageal reflux disease)   . History of hiatal hernia   . History of colonic polyps     PAST SURGICAL HISTORY:   Past Surgical History  Procedure Laterality Date  . Hernia repair    . Ercp N/A 02/09/2016    Procedure: ENDOSCOPIC RETROGRADE CHOLANGIOPANCREATOGRAPHY (ERCP);  Surgeon: Lucilla Lame, MD;  Location[facility] ENDOSCOPY;  Service: Endoscopy;  Laterality: N/A;  . Skin cancer excision      x 3  . Colonoscopy    . Esophagogastroduodenoscopy    . Portacath placement Left 02/27/2016    Procedure: INSERTION PORT-A-CATH;  Surgeon: Christene Lye, MD;  Location: [facility] ORS;  Service: General;  Laterality: Left;    SOCIAL HISTORY:   Social History  Substance Use Topics  . Smoking status: Former Smoker -- 25 years    Quit date: 12/09/1974  . Smokeless tobacco: Never Used  . Alcohol Use: 0.0 oz/week    0 Standard drinks or equivalent per week     FAMILY HISTORY:   Family History  Problem Relation Age of Onset  . Cancer Father 49    lung  . Breast cancer Maternal Aunt   . Brain cancer Maternal Aunt     DRUG ALLERGIES:   Allergies  Allergen Reactions  . Lodine [Etodolac] Nausea And Vomiting    REVIEW OF SYSTEMS:  REVIEW OF SYSTEMS:  CONSTITUTIONAL: Denies fevers, chills, fatigue, weakness.  EYES: Denies blurred vision, double vision, or eye pain. Positive vision loss as described above EARS, NOSE, THROAT: Denies tinnitus, ear pain, hearing loss.  RESPIRATORY: denies cough, shortness of breath, wheezing  CARDIOVASCULAR: Denies chest pain, palpitations, left lower extremity edema.  GASTROINTESTINAL: Denies nausea, vomiting, diarrhea, abdominal pain.  GENITOURINARY: Denies dysuria, hematuria.  ENDOCRINE: Denies nocturia or thyroid problems. HEMATOLOGIC AND LYMPHATIC: Denies easy bruising or bleeding.  SKIN: Denies rash or lesions.  MUSCULOSKELETAL: Denies pain in neck, back, shoulder, knees, hips, or further arthritic symptoms.  NEUROLOGIC: Denies paralysis, paresthesias.  PSYCHIATRIC: Denies anxiety or depressive symptoms. Otherwise full review of systems performed by me is negative.   MEDICATIONS AT HOME:   Prior to Admission medications   Medication Sig Start Date End Date Taking? Authorizing Provider  lidocaine-prilocaine (EMLA) cream Apply 1 application topically as needed. Apply 1/2 tablespoon 1-2 hours prior port being used and cover 02/22/16  Yes Lequita Asal, MD  magnesium hydroxide (MILK  OF MAGNESIA) 400 MG/5ML suspension Take 30 mLs by mouth daily as needed for mild constipation.   Yes Historical Provider, MD  megestrol (MEGACE) 400 MG/10ML suspension Take 5 mLs (200 mg total) by mouth daily. 03/07/16  Yes Lequita Asal, MD  methimazole (TAPAZOLE) 10 MG tablet Take 5 mg by mouth daily. Reported on 03/29/2016 03/27/16  Yes Historical Provider, MD  ondansetron (ZOFRAN-ODT) 4 MG disintegrating tablet  Take 1 tablet (4 mg total) by mouth every 6 (six) hours as needed for nausea. Reported on 02/14/2016 03/07/16  Yes Lequita Asal, MD  oxyCODONE (OXY IR/ROXICODONE) 5 MG immediate release tablet Take 1 tablet (5 mg total) by mouth every 4 (four) hours as needed for moderate pain. 03/07/16  Yes Lequita Asal, MD  Rivaroxaban (XARELTO) 15 MG TABS tablet Take 15 mg by mouth 2 (two) times daily with a meal.   Yes Historical Provider, MD  simethicone (MYLICON) 80 MG chewable tablet Chew 80 mg by mouth every 6 (six) hours as needed for flatulence.   Yes Historical Provider, MD  zolpidem (AMBIEN) 5 MG tablet Take 1 tablet (5 mg total) by mouth at bedtime as needed for sleep. 03/07/16  Yes Lequita Asal, MD      VITAL SIGNS:  Blood pressure 121/76, pulse 95, temperature 98.5 F (36.9 C), temperature source Oral, resp. rate 14, height 5\' 11"  (1.803 m), weight 70.308 kg (155 lb), SpO2 100 %.  PHYSICAL EXAMINATION:  VITAL SIGNS: Filed Vitals:   04/04/16 1130 04/04/16 1136  BP: 121/76   Pulse: 95   Temp:  98.5 F (36.9 C)  Resp: 14    GENERAL:82 y.o.male currently in no acute distress.  HEAD: Normocephalic, atraumatic.  EYES: Pupils equal, round, reactive to light. Extraocular muscles intact. No scleral icterus.  MOUTH: Moist mucosal membrane. Dentition intact. No abscess noted.  EAR, NOSE, THROAT: Clear without exudates. No external lesions.  NECK: Supple. No thyromegaly. No nodules. No JVD.  PULMONARY: Clear to ascultation, without wheeze rails or rhonci. No use of accessory muscles, Good respiratory effort. good air entry bilaterally CHEST: Nontender to palpation.  CARDIOVASCULAR: S1 and S2. Regular rate and rhythm. No murmurs, rubs, or gallops. Left lower extremity 2+ edema. Pedal pulses 2+ bilaterally.  GASTROINTESTINAL: Soft, nontender, nondistended. No masses. Positive bowel sounds. No hepatosplenomegaly.  MUSCULOSKELETAL: No swelling, clubbing, or edema. Range of motion full in  all extremities.  NEUROLOGIC: Left field visual loss of both eyes most prominent left eye otherwise Cranial nerves II through XII are intact. Pronator drift within normal limits strength 5/5 all extremities including proximal and distal flexion extension excluding left lower extremity proximal flexion Sensation intact. Reflexes intact.  SKIN: No ulceration, lesions, rashes, or cyanosis. Skin warm and dry. Turgor intact.  PSYCHIATRIC: Mood, affect within normal limits. The patient is awake, alert and oriented x 3. Insight, judgment intact.    LABORATORY PANEL:   CBC  Recent Labs Lab 04/04/16 1111  WBC 9.3  HGB 10.3*  HCT 31.0*  PLT 139*   ------------------------------------------------------------------------------------------------------------------  Chemistries   Recent Labs Lab 04/04/16 1111  NA 134*  K 3.9  CL 103  CO2 21*  GLUCOSE 118*  BUN 24*  CREATININE 1.00  CALCIUM 8.6*  AST 43*  ALT 50  ALKPHOS 511*  BILITOT 1.0   ------------------------------------------------------------------------------------------------------------------  Cardiac Enzymes No results for input(s): TROPONINI in the last 168 hours. ------------------------------------------------------------------------------------------------------------------  RADIOLOGY:  Ct Head Wo Contrast  04/04/2016  CLINICAL DATA:  Code stroke EXAM: CT HEAD  WITHOUT CONTRAST TECHNIQUE: Contiguous axial images were obtained from the base of the skull through the vertex without intravenous contrast. COMPARISON:  05/08/2013 FINDINGS: No skull fracture is noted. Paranasal sinuses and mastoid air cells are unremarkable. Mild cerebral atrophy. Mild periventricular chronic white matter disease. No definite acute cortical infarction. No mass lesion is noted on this unenhanced scan. Ventricular size is stable from prior exam. IMPRESSION: No acute intracranial abnormality. Mild cerebral atrophy. Mild periventricular chronic  white matter disease. No definite acute cortical infarction. These results were called by telephone at the time of interpretation on 04/04/2016 at 11:13 am to Dr. Delman Kitten , who verbally acknowledged these results. Electronically Signed   By: Lahoma Crocker M.D.   On: 04/04/2016 11:13    EKG:   Orders placed or performed during the hospital encounter of 04/04/16  . ED EKG  . ED EKG    IMPRESSION AND PLAN:   82 year old Caucasian gentleman history of pancreatic cancer as well as multiple TIAs presenting with left visual field loss  1.CVA, unspecified: Initiate aspirin/statin therapy, stroke protocol/precautions, place on telemetry, trend cardiac enzymes, check MRI brain, complete echocardiogram, lipid panel, hemoglobin A1c, bilateral carotid Doppler, permissive hypertension first 24 hours treating blood pressure only greater than 220/120, case discussed with neurologist at bedside 2. Recent left lower extremity DVT: Continue Xarelto at current dosage 3. Hyperthyroidism: Methimazole 4. Venous thromboembolism prophylactic: Therapeutic Xarelto    All the records are reviewed and case discussed with ED provider. Management plans discussed with the patient, family and they are in agreement.  CODE STATUS: Full  TOTAL TIME TAKING CARE OF THIS PATIENT: 33 minutes.    Hower,  Karenann Cai.D on 04/04/2016 at 12:18 PM  Between 7am to 6pm - Pager - [phone]  After 6pm: House Pager: - [phone]  [facility]  Office  [phone]  CC: Primary care physician; BABAOFF, Caryl Bis, MD

## 2016-04-05 ENCOUNTER — Inpatient Hospital Stay (HOSPITAL_COMMUNITY)
Admit: 2016-04-05 | Discharge: 2016-04-05 | Disposition: A | Discharge status: Home / Self Care | Payer: Medicare Other | Attending: Internal Medicine | Admitting: Internal Medicine

## 2016-04-05 DIAGNOSIS — C787 Secondary malignant neoplasm of liver and intrahepatic bile duct: Secondary | ICD-10-CM | POA: Diagnosis present

## 2016-04-05 DIAGNOSIS — C259 Malignant neoplasm of pancreas, unspecified: Secondary | ICD-10-CM

## 2016-04-05 DIAGNOSIS — I639 Cerebral infarction, unspecified: Secondary | ICD-10-CM | POA: Diagnosis not present

## 2016-04-05 DIAGNOSIS — I6350 Cerebral infarction due to unspecified occlusion or stenosis of unspecified cerebral artery: Secondary | ICD-10-CM | POA: Diagnosis not present

## 2016-04-05 DIAGNOSIS — Z888 Allergy status to other drugs, medicaments and biological substances status: Secondary | ICD-10-CM | POA: Diagnosis not present

## 2016-04-05 DIAGNOSIS — Z86010 Personal history of colonic polyps: Secondary | ICD-10-CM | POA: Diagnosis not present

## 2016-04-05 DIAGNOSIS — I824Z2 Acute embolism and thrombosis of unspecified deep veins of left distal lower extremity: Secondary | ICD-10-CM | POA: Diagnosis not present

## 2016-04-05 DIAGNOSIS — Z87891 Personal history of nicotine dependence: Secondary | ICD-10-CM | POA: Diagnosis not present

## 2016-04-05 DIAGNOSIS — I672 Cerebral atherosclerosis: Secondary | ICD-10-CM

## 2016-04-05 DIAGNOSIS — Z79891 Long term (current) use of opiate analgesic: Secondary | ICD-10-CM | POA: Diagnosis not present

## 2016-04-05 DIAGNOSIS — E039 Hypothyroidism, unspecified: Secondary | ICD-10-CM | POA: Diagnosis present

## 2016-04-05 DIAGNOSIS — D649 Anemia, unspecified: Secondary | ICD-10-CM | POA: Diagnosis present

## 2016-04-05 DIAGNOSIS — C7800 Secondary malignant neoplasm of unspecified lung: Secondary | ICD-10-CM | POA: Diagnosis present

## 2016-04-05 DIAGNOSIS — Z7901 Long term (current) use of anticoagulants: Secondary | ICD-10-CM

## 2016-04-05 DIAGNOSIS — K219 Gastro-esophageal reflux disease without esophagitis: Secondary | ICD-10-CM | POA: Diagnosis present

## 2016-04-05 DIAGNOSIS — Z8507 Personal history of malignant neoplasm of pancreas: Secondary | ICD-10-CM | POA: Diagnosis not present

## 2016-04-05 DIAGNOSIS — I10 Essential (primary) hypertension: Secondary | ICD-10-CM | POA: Diagnosis present

## 2016-04-05 DIAGNOSIS — Z86718 Personal history of other venous thrombosis and embolism: Secondary | ICD-10-CM | POA: Diagnosis not present

## 2016-04-05 DIAGNOSIS — Z79899 Other long term (current) drug therapy: Secondary | ICD-10-CM | POA: Diagnosis not present

## 2016-04-05 LAB — LIPID PANEL
Cholesterol: 132 mg/dL (ref 0–200)
HDL: 36 mg/dL — ABNORMAL LOW (ref >40)
LDL CALC: 78 mg/dL (ref 0–99)
Total CHOL/HDL Ratio: 3.7 RATIO
Triglycerides: 90 mg/dL (ref <150)
VLDL: 18 mg/dL (ref 0–40)

## 2016-04-05 LAB — BASIC METABOLIC PANEL
ANION GAP: 10 (ref 5–15)
BUN: 21 mg/dL — AB (ref 6–20)
CO2: 21 mmol/L — AB (ref 22–32)
Calcium: 8.2 mg/dL — ABNORMAL LOW (ref 8.9–10.3)
Chloride: 105 mmol/L (ref 101–111)
Creatinine, Ser: 0.88 mg/dL (ref 0.61–1.24)
GFR calc Af Amer: >60 mL/min (ref >60)
GFR calc non Af Amer: >60 mL/min (ref >60)
GLUCOSE: 89 mg/dL (ref 65–99)
POTASSIUM: 4 mmol/L (ref 3.5–5.1)
Sodium: 136 mmol/L (ref 135–145)

## 2016-04-05 LAB — ECHOCARDIOGRAM COMPLETE
Height: 71 in
Weight: 2480 oz

## 2016-04-05 LAB — CBC
HEMATOCRIT: 25.1 % — AB (ref 40.0–52.0)
HEMOGLOBIN: 8.6 g/dL — AB (ref 13.0–18.0)
MCH: 31.9 pg (ref 26.0–34.0)
MCHC: 34.2 g/dL (ref 32.0–36.0)
MCV: 93.4 fL (ref 80.0–100.0)
Platelets: 125 10*3/uL — ABNORMAL LOW (ref 150–440)
RBC: 2.69 MIL/uL — ABNORMAL LOW (ref 4.40–5.90)
RDW: 15.7 % — AB (ref 11.5–14.5)
WBC: 8.5 10*3/uL (ref 3.8–10.6)

## 2016-04-05 LAB — TSH: TSH: 0.154 u[IU]/mL — AB (ref 0.350–4.500)

## 2016-04-05 LAB — T4, FREE: Free T4: 2.13 ng/dL — ABNORMAL HIGH (ref 0.61–1.12)

## 2016-04-05 LAB — HEMOGLOBIN A1C: HEMOGLOBIN A1C: 5.6 % (ref 4.0–6.0)

## 2016-04-05 MED ORDER — ENSURE ENLIVE PO LIQD
237.0000 mL | Freq: Two times a day (BID) | ORAL | Status: DC
  Administered 2016-04-06: 237 mL via ORAL

## 2016-04-05 MED ORDER — ONDANSETRON HCL 4 MG/2ML IJ SOLN: 4.0000 mg | Freq: Four times a day (QID) | INTRAMUSCULAR | Status: DC | PRN

## 2016-04-05 NOTE — Progress Notes (Signed)
Subjective: Vision unchanged.  No new neurological complaints  Objective: Current vital signs: BP 116/52 mmHg  Pulse 95  Temp(Src) 98.7 F (37.1 C) (Oral)  Resp 20  Ht 5\' 11"  (1.803 m)  Wt 70.308 kg (155 lb)  BMI 21.63 kg/m2  SpO2 99% Vital signs in last 24 hours: Temp:  [98 F (36.7 C)-98.9 F (37.2 C)] 98.7 F (37.1 C) (04/28 0338) Pulse Rate:  [83-99] 95 (04/28 0338) Resp:  [14-20] 20 (04/28 0338) BP: (116-144)/(52-79) 116/52 mmHg (04/28 0338) SpO2:  [98 %-100 %] 99 % (04/28 0338)  Intake/Output from previous day: 04/27 0701 - 04/28 0700 In: -  Out: 275 [Urine:275] Intake/Output this shift:   Nutritional status: Diet regular Room service appropriate?: Yes; Fluid consistency:: Thin  Neurologic Exam: Mental Status: Alert, oriented, thought content appropriate. Speech fluent without evidence of aphasia. Able to follow 3 step commands without difficulty. Cranial Nerves: II: Discs flat bilaterally; LHH, pupils equal, round, reactive to light and accommodation III,IV, VI: ptosis not present, extra-ocular motions intact bilaterally V,VII: decrease in right NLF, facial light touch sensation normal bilaterally VIII: hearing normal bilaterally IX,X: gag reflex present XI: bilateral shoulder shrug XII: midline tongue extension Motor: Right :Upper extremity 5/5Left: Upper extremity 5/5 Lower extremity 5/5Lower extremity 4+/5 Tone and bulk:normal tone throughout; no atrophy noted Sensory: Pinprick and light touch intact throughout, bilaterally Deep Tendon Reflexes: 2+ and symmetric with absent AJ's bilaterally Plantars: Right: muteLeft: mute Cerebellar: Normal finger-to-nose and normal heel-to-shin testing bilaterally Gait: not tested due to safety concerns  Lab Results: Basic Metabolic Panel:  Recent Labs Lab 04/04/16 1111  04/05/16 0558  NA 134* 136  K 3.9 4.0  CL 103 105  CO2 21* 21*  GLUCOSE 118* 89  BUN 24* 21*  CREATININE 1.00 0.88  CALCIUM 8.6* 8.2*    Liver Function Tests:  Recent Labs Lab 04/04/16 1111  AST 43*  ALT 50  ALKPHOS 511*  BILITOT 1.0  PROT 6.1*  ALBUMIN 3.1*   No results for input(s): LIPASE, AMYLASE in the last 168 hours. No results for input(s): AMMONIA in the last 168 hours.  CBC:  Recent Labs Lab 04/04/16 1111 04/05/16 0558  WBC 9.3 8.5  NEUTROABS 7.9*  --   HGB 10.3* 8.6*  HCT 31.0* 25.1*  MCV 94.1 93.4  PLT 139* 125*    Cardiac Enzymes: No results for input(s): CKTOTAL, CKMB, CKMBINDEX, TROPONINI in the last 168 hours.  Lipid Panel:  Recent Labs Lab 04/05/16 0558  CHOL 132  TRIG 90  HDL 36*  CHOLHDL 3.7  VLDL 18  LDLCALC 78    CBG: No results for input(s): GLUCAP in the last 168 hours.  Microbiology: Results for orders placed or performed during the hospital encounter of 02/23/16  Surgical pcr screen     Status: None   Collection Time: 02/23/16 11:41 AM  Result Value Ref Range Status   MRSA, PCR NEGATIVE NEGATIVE Final   Staphylococcus aureus NEGATIVE NEGATIVE Final    Comment:        The Xpert SA Assay (FDA approved for NASAL specimens in patients over 44 years of age), is one component of a comprehensive surveillance program.  Test performance has been validated by [facility] for patients greater than or equal to 49 year old. It is not intended to diagnose infection nor to guide or monitor treatment.     Coagulation Studies:  Recent Labs  04/04/16 1111  LABPROT 28.4*  INR 2.72    Imaging: Ct Angio Head  W/cm &/or Wo Cm  04/04/2016  CLINICAL DATA:  82 year old male with left eye peripheral vision loss since waking today. Initial encounter. Pancreatic cancer. EXAM: CT ANGIOGRAPHY HEAD AND NECK TECHNIQUE: Multidetector CT imaging of the head and neck was performed using the standard protocol during bolus administration  of intravenous contrast. Multiplanar CT image reconstructions and MIPs were obtained to evaluate the vascular anatomy. Carotid stenosis measurements (when applicable) are obtained utilizing NASCET criteria, using the distal internal carotid diameter as the denominator. CONTRAST:  75 mL Isovue 370. COMPARISON:  Head CT without contrast 1103 hours today. PET-CT 02/15/2016 FINDINGS: CTA NECK Skeleton: No acute osseous abnormality identified. Degenerative changes in the cervical spine with moderate to severe degenerative spinal stenosis that C5-C6. Other neck: Left chest porta cath, subclavian approach. Centrilobular and paraseptal emphysema. Apical lung scarring. At the level of the right hilum there is partially visible new peribronchial nodularity in spiculated opacity (series 4, image 11). There is fairly widespread fine nodular opacity in the right upper lobe which seems to be new since March. Prevascular superior mediastinal metastatic disease appears stable to mildly increased (series 4, image 68). Superimposed large heterogeneous thyroid goiter with superior mediastinal extension and dystrophic calcifications. Larynx, pharynx, parapharyngeal spaces, retropharyngeal space, sublingual space, submandibular glands, and parotid glands are within normal limits. Small metastatic nodes anterior to the left subclavian artery re- demonstrated (series 4, image 91) an may be mildly progressed. Aortic arch: 3 vessel arch configuration. Moderate soft and calcified arch atherosclerosis. No great vessel origin stenosis. Right carotid system: Negative right CCA. Calcified plaque at the posterior right ICA and bulb. No right ICA origin stenosis. At the level of the distal bulb there is additional soft plaque, but subsequent stenosis is less than 50 % with respect to the distal vessel. See series 9, image 57). Right ICA remains patent to the skullbase. Left carotid system: No left CCA origin stenosis. Negative left CCA. Mild  plaque at the left carotid bifurcation without stenosis. Negative cervical left ICA. Vertebral arteries:No proximal right subclavian artery stenosis despite plaque. Normal right vertebral artery origin. Mildly dominant right vertebral artery is normal to the skullbase. No proximal left subclavian artery stenosis despite plaque. Dense calcified plaque at the left vertebral artery origin resulting in severe left vertebral artery origin stenosis. Non dominant left vertebral artery remains patent and is otherwise negative to the skullbase. CTA HEAD Posterior circulation: Patent distal vertebral arteries, the right is dominant. Normal PICA origins. Normal vertebrobasilar junction. No basilar artery stenosis. Normal SCA and PCA origins. Right posterior communicating artery is present, the left is diminutive or absent. The distal left PCA branches appear occluded in the P3 division (series 10, image 23). Right PCA branches are within normal limits. Anterior circulation: Both ICA siphons are patent without significant plaque or stenosis. Both ophthalmic arteries appear to remain patent. Patent carotid termini. Normal MCA and ACA origins. Anterior communicating artery and bilateral ACA branches are within normal limits. Left MCA M1 segment, bifurcation, and left MCA branches are within normal limits. Right MCA M1 segment, bifurcation, and right MCA branches are within normal limits. Venous sinuses: Patent. Anatomic variants: Dominant right vertebral artery. Delayed phase: No acute cortically based infarct identified, including in the left PCA territory. No abnormal enhancement identified. No midline shift, mass effect, or evidence of intracranial mass lesion. IMPRESSION: 1. Negative for emergent large vessel occlusion, but suggestion of occluded distal left PCA branches. However, there is no CT evidence of left PCA territory ischemia at this time.  2. Moderate to severe atherosclerotic stenosis of the left vertebral artery  origin. Otherwise aortic arch and neck atherosclerosis without hemodynamically significant stenosis. 3. Abnormal right upper lobe nodular and spiculated opacities appear to be new since March. Top differential considerations in this setting include metastatic disease and acute respiratory infection. 4. Metastatic nodal disease at the left thoracic inlet and superior mediastinum. Electronically Signed   By: Genevie Ann M.D.   On: 04/04/2016 14:11   Ct Head Wo Contrast  04/04/2016  CLINICAL DATA:  Code stroke EXAM: CT HEAD WITHOUT CONTRAST TECHNIQUE: Contiguous axial images were obtained from the base of the skull through the vertex without intravenous contrast. COMPARISON:  05/08/2013 FINDINGS: No skull fracture is noted. Paranasal sinuses and mastoid air cells are unremarkable. Mild cerebral atrophy. Mild periventricular chronic white matter disease. No definite acute cortical infarction. No mass lesion is noted on this unenhanced scan. Ventricular size is stable from prior exam. IMPRESSION: No acute intracranial abnormality. Mild cerebral atrophy. Mild periventricular chronic white matter disease. No definite acute cortical infarction. These results were called by telephone at the time of interpretation on 04/04/2016 at 11:13 am to Dr. Delman Kitten , who verbally acknowledged these results. Electronically Signed   By: Lahoma Crocker M.D.   On: 04/04/2016 11:13   Ct Angio Neck W/cm &/or Wo/cm  04/04/2016  CLINICAL DATA:  82 year old male with left eye peripheral vision loss since waking today. Initial encounter. Pancreatic cancer. EXAM: CT ANGIOGRAPHY HEAD AND NECK TECHNIQUE: Multidetector CT imaging of the head and neck was performed using the standard protocol during bolus administration of intravenous contrast. Multiplanar CT image reconstructions and MIPs were obtained to evaluate the vascular anatomy. Carotid stenosis measurements (when applicable) are obtained utilizing NASCET criteria, using the distal internal  carotid diameter as the denominator. CONTRAST:  75 mL Isovue 370. COMPARISON:  Head CT without contrast 1103 hours today. PET-CT 02/15/2016 FINDINGS: CTA NECK Skeleton: No acute osseous abnormality identified. Degenerative changes in the cervical spine with moderate to severe degenerative spinal stenosis that C5-C6. Other neck: Left chest porta cath, subclavian approach. Centrilobular and paraseptal emphysema. Apical lung scarring. At the level of the right hilum there is partially visible new peribronchial nodularity in spiculated opacity (series 4, image 11). There is fairly widespread fine nodular opacity in the right upper lobe which seems to be new since March. Prevascular superior mediastinal metastatic disease appears stable to mildly increased (series 4, image 68). Superimposed large heterogeneous thyroid goiter with superior mediastinal extension and dystrophic calcifications. Larynx, pharynx, parapharyngeal spaces, retropharyngeal space, sublingual space, submandibular glands, and parotid glands are within normal limits. Small metastatic nodes anterior to the left subclavian artery re- demonstrated (series 4, image 91) an may be mildly progressed. Aortic arch: 3 vessel arch configuration. Moderate soft and calcified arch atherosclerosis. No great vessel origin stenosis. Right carotid system: Negative right CCA. Calcified plaque at the posterior right ICA and bulb. No right ICA origin stenosis. At the level of the distal bulb there is additional soft plaque, but subsequent stenosis is less than 50 % with respect to the distal vessel. See series 9, image 57). Right ICA remains patent to the skullbase. Left carotid system: No left CCA origin stenosis. Negative left CCA. Mild plaque at the left carotid bifurcation without stenosis. Negative cervical left ICA. Vertebral arteries:No proximal right subclavian artery stenosis despite plaque. Normal right vertebral artery origin. Mildly dominant right vertebral  artery is normal to the skullbase. No proximal left subclavian artery stenosis despite plaque.  Dense calcified plaque at the left vertebral artery origin resulting in severe left vertebral artery origin stenosis. Non dominant left vertebral artery remains patent and is otherwise negative to the skullbase. CTA HEAD Posterior circulation: Patent distal vertebral arteries, the right is dominant. Normal PICA origins. Normal vertebrobasilar junction. No basilar artery stenosis. Normal SCA and PCA origins. Right posterior communicating artery is present, the left is diminutive or absent. The distal left PCA branches appear occluded in the P3 division (series 10, image 23). Right PCA branches are within normal limits. Anterior circulation: Both ICA siphons are patent without significant plaque or stenosis. Both ophthalmic arteries appear to remain patent. Patent carotid termini. Normal MCA and ACA origins. Anterior communicating artery and bilateral ACA branches are within normal limits. Left MCA M1 segment, bifurcation, and left MCA branches are within normal limits. Right MCA M1 segment, bifurcation, and right MCA branches are within normal limits. Venous sinuses: Patent. Anatomic variants: Dominant right vertebral artery. Delayed phase: No acute cortically based infarct identified, including in the left PCA territory. No abnormal enhancement identified. No midline shift, mass effect, or evidence of intracranial mass lesion. IMPRESSION: 1. Negative for emergent large vessel occlusion, but suggestion of occluded distal left PCA branches. However, there is no CT evidence of left PCA territory ischemia at this time. 2. Moderate to severe atherosclerotic stenosis of the left vertebral artery origin. Otherwise aortic arch and neck atherosclerosis without hemodynamically significant stenosis. 3. Abnormal right upper lobe nodular and spiculated opacities appear to be new since March. Top differential considerations in this  setting include metastatic disease and acute respiratory infection. 4. Metastatic nodal disease at the left thoracic inlet and superior mediastinum. Electronically Signed   By: Genevie Ann M.D.   On: 04/04/2016 14:11   Mr Brain Wo Contrast  04/04/2016  CLINICAL DATA:  82 year old hypertensive male with pancreatic cancer presenting with left-sided visual loss. Subsequent encounter. EXAM: MRI HEAD WITHOUT CONTRAST TECHNIQUE: Multiplanar, multiecho pulse sequences of the brain and surrounding structures were obtained without intravenous contrast. COMPARISON:  04/04/2016 CT angiogram and CT head.  2014 brain MR. FINDINGS: Acute nonhemorrhagic infarct medial right occipital lobe extending to posterior medial right temporal lobe. No associated hemorrhage. Slow flow within adjacent vessel. Tiny area blood breakdown products left periventricular region probably related to prior episode hemorrhagic ischemia. Minimal small vessel disease changes. Global atrophy without hydrocephalus. Major intracranial vascular structures are patent. No intracranial mass lesion noted on this unenhanced exam. No acute orbital abnormality. Cervical medullary junction unremarkable. Mild cervical spondylotic changes C3-4. 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 No associated hemorrhage. Electronically Signed   By: Genia Del M.D.   On: 04/04/2016 17:40    Medications:  I have reviewed the patient's current medications. Scheduled: . aspirin  300 mg Rectal Daily   Or  . aspirin  325 mg Oral Daily  . atorvastatin  80 mg Oral q1800  . megestrol  200 mg Oral Daily  . methimazole  5 mg Oral Daily  . Rivaroxaban  15 mg Oral BID WC  . sodium chloride flush  3 mL Intravenous Q12H    Assessment/Plan: Patient unchanged.  MRI of the brain personally reviewed and shows an acute right occipital lobe infarct.  CTA shows no lesion amenable to intervention.  Patient  with DVT on Rivaroxaban.  Echocardiogram is pending.  LDL 78 and patient on statin.  A1c pending.    Recommendations: 1.  Patient on anticoagulation.  If acute infarct related to DVT would expect a PFO on ehcocardiogram.  If no PFO patient per history has had many TIA's.  May benefit from adding ASA to anticoagulation for the period of time that anticoagulation is required for DVT.  If PFO would consider change to Coumadin.       LOS: 0 days   Alexis Goodell, MD Neurology [phone] 04/05/2016  11:17 AM

## 2016-04-05 NOTE — Progress Notes (Signed)
[facility] [place] NAME: Cody Andrade    MR#:  [id number]  DATE OF BIRTH:  Nov 27, 1933  SUBJECTIVE:  CHIEF COMPLAINT:   Chief Complaint  Patient presents with  . Loss of Vision   - admitted with left homonymous hemoanopsia- MRI with right occipital infarct - recently started on xarelto for left leg DVT 6 days ago -No other complaints at this time. Feels tired.  REVIEW OF SYSTEMS:  Review of Systems  Constitutional: Positive for malaise/fatigue. Negative for fever and chills.  HENT: Negative for ear discharge and ear pain.   Eyes: Positive for blurred vision.  Respiratory: Negative for cough, sputum production and shortness of breath.   Cardiovascular: Positive for leg swelling. Negative for chest pain, palpitations and orthopnea.  Gastrointestinal: Negative for heartburn, nausea, vomiting, abdominal pain and diarrhea.  Genitourinary: Negative for dysuria.  Musculoskeletal: Negative for myalgias and joint pain.  Neurological: Negative for dizziness, sensory change, speech change, focal weakness and headaches.  Psychiatric/Behavioral: Negative for depression.    DRUG ALLERGIES:   Allergies  Allergen Reactions  . Lodine [Etodolac] Nausea And Vomiting    VITALS:  Blood pressure 116/52, pulse 95, temperature 98.7 F (37.1 C), temperature source Oral, resp. rate 20, height 5\' 11"  (1.803 m), weight 70.308 kg (155 lb), SpO2 99 %.  PHYSICAL EXAMINATION:  Physical Exam  GENERAL:  82 y.o.-year-old patient lying in the bed with no acute distress.  EYES: Pupils equal, round, reactive to light and accommodation. No scleral icterus. Extraocular muscles intact.  HEENT: Head atraumatic, normocephalic. Oropharynx and nasopharynx clear.  NECK:  Supple, no jugular venous distention. No thyroid enlargement, no tenderness.  LUNGS: Normal breath sounds bilaterally, no wheezing, rales,rhonchi or crepitation. No use of accessory muscles of  respiration.  CARDIOVASCULAR: S1, S2 normal. No murmurs, rubs, or gallops.  ABDOMEN: Soft, nontender, nondistended. Bowel sounds present. No organomegaly or mass.  EXTREMITIES: No pedal edema on right, cyanosis, or clubbing. Left leg swollen NEUROLOGIC: Left-sided homonymous hemianopsia, other than that Cranial nerves II through XII are intact. Muscle strength 5/5 in all extremities. Sensation intact. Gait not checked.  PSYCHIATRIC: The patient is alert and oriented x 3.  SKIN: No obvious rash, lesion, or ulcer.    LABORATORY PANEL:   CBC  Recent Labs Lab 04/05/16 0558  WBC 8.5  HGB 8.6*  HCT 25.1*  PLT 125*   ------------------------------------------------------------------------------------------------------------------  Chemistries   Recent Labs Lab 04/04/16 1111 04/05/16 0558  NA 134* 136  K 3.9 4.0  CL 103 105  CO2 21* 21*  GLUCOSE 118* 89  BUN 24* 21*  CREATININE 1.00 0.88  CALCIUM 8.6* 8.2*  AST 43*  --   ALT 50  --   ALKPHOS 511*  --   BILITOT 1.0  --    ------------------------------------------------------------------------------------------------------------------  Cardiac Enzymes No results for input(s): TROPONINI in the last 168 hours. ------------------------------------------------------------------------------------------------------------------  RADIOLOGY:  Ct Angio Head W/cm &/or Wo Cm  04/04/2016  CLINICAL DATA:  82 year old male with left eye peripheral vision loss since waking today. Initial encounter. Pancreatic cancer. EXAM: CT ANGIOGRAPHY HEAD AND NECK TECHNIQUE: Multidetector CT imaging of the head and neck was performed using the standard protocol during bolus administration of intravenous contrast. Multiplanar CT image reconstructions and MIPs were obtained to evaluate the vascular anatomy. Carotid stenosis measurements (when applicable) are obtained utilizing NASCET criteria, using the distal internal carotid diameter as the denominator.  CONTRAST:  75 mL Isovue 370. COMPARISON:  Head CT  without contrast 1103 hours today. PET-CT 02/15/2016 FINDINGS: CTA NECK Skeleton: No acute osseous abnormality identified. Degenerative changes in the cervical spine with moderate to severe degenerative spinal stenosis that C5-C6. Other neck: Left chest porta cath, subclavian approach. Centrilobular and paraseptal emphysema. Apical lung scarring. At the level of the right hilum there is partially visible new peribronchial nodularity in spiculated opacity (series 4, image 11). There is fairly widespread fine nodular opacity in the right upper lobe which seems to be new since March. Prevascular superior mediastinal metastatic disease appears stable to mildly increased (series 4, image 68). Superimposed large heterogeneous thyroid goiter with superior mediastinal extension and dystrophic calcifications. Larynx, pharynx, parapharyngeal spaces, retropharyngeal space, sublingual space, submandibular glands, and parotid glands are within normal limits. Small metastatic nodes anterior to the left subclavian artery re- demonstrated (series 4, image 91) an may be mildly progressed. Aortic arch: 3 vessel arch configuration. Moderate soft and calcified arch atherosclerosis. No great vessel origin stenosis. Right carotid system: Negative right CCA. Calcified plaque at the posterior right ICA and bulb. No right ICA origin stenosis. At the level of the distal bulb there is additional soft plaque, but subsequent stenosis is less than 50 % with respect to the distal vessel. See series 9, image 57). Right ICA remains patent to the skullbase. Left carotid system: No left CCA origin stenosis. Negative left CCA. Mild plaque at the left carotid bifurcation without stenosis. Negative cervical left ICA. Vertebral arteries:No proximal right subclavian artery stenosis despite plaque. Normal right vertebral artery origin. Mildly dominant right vertebral artery is normal to the skullbase. No  proximal left subclavian artery stenosis despite plaque. Dense calcified plaque at the left vertebral artery origin resulting in severe left vertebral artery origin stenosis. Non dominant left vertebral artery remains patent and is otherwise negative to the skullbase. CTA HEAD Posterior circulation: Patent distal vertebral arteries, the right is dominant. Normal PICA origins. Normal vertebrobasilar junction. No basilar artery stenosis. Normal SCA and PCA origins. Right posterior communicating artery is present, the left is diminutive or absent. The distal left PCA branches appear occluded in the P3 division (series 10, image 23). Right PCA branches are within normal limits. Anterior circulation: Both ICA siphons are patent without significant plaque or stenosis. Both ophthalmic arteries appear to remain patent. Patent carotid termini. Normal MCA and ACA origins. Anterior communicating artery and bilateral ACA branches are within normal limits. Left MCA M1 segment, bifurcation, and left MCA branches are within normal limits. Right MCA M1 segment, bifurcation, and right MCA branches are within normal limits. Venous sinuses: Patent. Anatomic variants: Dominant right vertebral artery. Delayed phase: No acute cortically based infarct identified, including in the left PCA territory. No abnormal enhancement identified. No midline shift, mass effect, or evidence of intracranial mass lesion. IMPRESSION: 1. Negative for emergent large vessel occlusion, but suggestion of occluded distal left PCA branches. However, there is no CT evidence of left PCA territory ischemia at this time. 2. Moderate to severe atherosclerotic stenosis of the left vertebral artery origin. Otherwise aortic arch and neck atherosclerosis without hemodynamically significant stenosis. 3. Abnormal right upper lobe nodular and spiculated opacities appear to be new since March. Top differential considerations in this setting include metastatic disease and  acute respiratory infection. 4. Metastatic nodal disease at the left thoracic inlet and superior mediastinum. Electronically Signed   By: Genevie Ann M.D.   On: 04/04/2016 14:11   Ct Head Wo Contrast  04/04/2016  CLINICAL DATA:  Code stroke EXAM: CT HEAD WITHOUT  CONTRAST TECHNIQUE: Contiguous axial images were obtained from the base of the skull through the vertex without intravenous contrast. COMPARISON:  05/08/2013 FINDINGS: No skull fracture is noted. Paranasal sinuses and mastoid air cells are unremarkable. Mild cerebral atrophy. Mild periventricular chronic white matter disease. No definite acute cortical infarction. No mass lesion is noted on this unenhanced scan. Ventricular size is stable from prior exam. IMPRESSION: No acute intracranial abnormality. Mild cerebral atrophy. Mild periventricular chronic white matter disease. No definite acute cortical infarction. These results were called by telephone at the time of interpretation on 04/04/2016 at 11:13 am to Dr. Delman Kitten , who verbally acknowledged these results. Electronically Signed   By: Lahoma Crocker M.D.   On: 04/04/2016 11:13   Ct Angio Neck W/cm &/or Wo/cm  04/04/2016  CLINICAL DATA:  82 year old male with left eye peripheral vision loss since waking today. Initial encounter. Pancreatic cancer. EXAM: CT ANGIOGRAPHY HEAD AND NECK TECHNIQUE: Multidetector CT imaging of the head and neck was performed using the standard protocol during bolus administration of intravenous contrast. Multiplanar CT image reconstructions and MIPs were obtained to evaluate the vascular anatomy. Carotid stenosis measurements (when applicable) are obtained utilizing NASCET criteria, using the distal internal carotid diameter as the denominator. CONTRAST:  75 mL Isovue 370. COMPARISON:  Head CT without contrast 1103 hours today. PET-CT 02/15/2016 FINDINGS: CTA NECK Skeleton: No acute osseous abnormality identified. Degenerative changes in the cervical spine with moderate to  severe degenerative spinal stenosis that C5-C6. Other neck: Left chest porta cath, subclavian approach. Centrilobular and paraseptal emphysema. Apical lung scarring. At the level of the right hilum there is partially visible new peribronchial nodularity in spiculated opacity (series 4, image 11). There is fairly widespread fine nodular opacity in the right upper lobe which seems to be new since March. Prevascular superior mediastinal metastatic disease appears stable to mildly increased (series 4, image 68). Superimposed large heterogeneous thyroid goiter with superior mediastinal extension and dystrophic calcifications. Larynx, pharynx, parapharyngeal spaces, retropharyngeal space, sublingual space, submandibular glands, and parotid glands are within normal limits. Small metastatic nodes anterior to the left subclavian artery re- demonstrated (series 4, image 91) an may be mildly progressed. Aortic arch: 3 vessel arch configuration. Moderate soft and calcified arch atherosclerosis. No great vessel origin stenosis. Right carotid system: Negative right CCA. Calcified plaque at the posterior right ICA and bulb. No right ICA origin stenosis. At the level of the distal bulb there is additional soft plaque, but subsequent stenosis is less than 50 % with respect to the distal vessel. See series 9, image 57). Right ICA remains patent to the skullbase. Left carotid system: No left CCA origin stenosis. Negative left CCA. Mild plaque at the left carotid bifurcation without stenosis. Negative cervical left ICA. Vertebral arteries:No proximal right subclavian artery stenosis despite plaque. Normal right vertebral artery origin. Mildly dominant right vertebral artery is normal to the skullbase. No proximal left subclavian artery stenosis despite plaque. Dense calcified plaque at the left vertebral artery origin resulting in severe left vertebral artery origin stenosis. Non dominant left vertebral artery remains patent and is  otherwise negative to the skullbase. CTA HEAD Posterior circulation: Patent distal vertebral arteries, the right is dominant. Normal PICA origins. Normal vertebrobasilar junction. No basilar artery stenosis. Normal SCA and PCA origins. Right posterior communicating artery is present, the left is diminutive or absent. The distal left PCA branches appear occluded in the P3 division (series 10, image 23). Right PCA branches are within normal limits. Anterior circulation: Both  ICA siphons are patent without significant plaque or stenosis. Both ophthalmic arteries appear to remain patent. Patent carotid termini. Normal MCA and ACA origins. Anterior communicating artery and bilateral ACA branches are within normal limits. Left MCA M1 segment, bifurcation, and left MCA branches are within normal limits. Right MCA M1 segment, bifurcation, and right MCA branches are within normal limits. Venous sinuses: Patent. Anatomic variants: Dominant right vertebral artery. Delayed phase: No acute cortically based infarct identified, including in the left PCA territory. No abnormal enhancement identified. No midline shift, mass effect, or evidence of intracranial mass lesion. IMPRESSION: 1. Negative for emergent large vessel occlusion, but suggestion of occluded distal left PCA branches. However, there is no CT evidence of left PCA territory ischemia at this time. 2. Moderate to severe atherosclerotic stenosis of the left vertebral artery origin. Otherwise aortic arch and neck atherosclerosis without hemodynamically significant stenosis. 3. Abnormal right upper lobe nodular and spiculated opacities appear to be new since March. Top differential considerations in this setting include metastatic disease and acute respiratory infection. 4. Metastatic nodal disease at the left thoracic inlet and superior mediastinum. Electronically Signed   By: Genevie Ann M.D.   On: 04/04/2016 14:11   Mr Brain Wo Contrast  04/04/2016  CLINICAL DATA:   82 year old hypertensive male with pancreatic cancer presenting with left-sided visual loss. Subsequent encounter. EXAM: MRI HEAD WITHOUT CONTRAST TECHNIQUE: Multiplanar, multiecho pulse sequences of the brain and surrounding structures were obtained without intravenous contrast. COMPARISON:  04/04/2016 CT angiogram and CT head.  2014 brain MR. FINDINGS: Acute nonhemorrhagic infarct medial right occipital lobe extending to posterior medial right temporal lobe. No associated hemorrhage. Slow flow within adjacent vessel. Tiny area blood breakdown products left periventricular region probably related to prior episode hemorrhagic ischemia. Minimal small vessel disease changes. Global atrophy without hydrocephalus. Major intracranial vascular structures are patent. No intracranial mass lesion noted on this unenhanced exam. No acute orbital abnormality. Cervical medullary junction unremarkable. Mild cervical spondylotic changes C3-4. 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 No associated hemorrhage. Electronically Signed   By: Genia Del M.D.   On: 04/04/2016 17:40    EKG:   Orders placed or performed during the hospital encounter of 04/04/16  . ED EKG  . ED EKG    ASSESSMENT AND PLAN:   82 year old male with past medical history significant for metastatic pancreatic cancer diagnosed in March 2017 on gemcitabine with metastases to liver and lung, recent diagnosis of left lower extremity DVT and started on Xarelto 6 days ago, prior TIAs presents to the hospital secondary to left-sided vision changes.  #1 Acute right occipital infarct- MRI of the brain with acute nonhemorrhagic infarct in the right occipital lobe extending onto the right temporal lobe. No associated hemorrhage noted. -Patient has hypercoagulable state due to his pancreatic cancer. Just started on Xarelto 6 days ago. He had the stroke in spite of being on  Xarelto. -Hour waiting further neurology recommendations. Continue statin -CT angios with significant atherosclerosis, worse on the left side though the stroke is on the right side. -Echocardiogram pending to rule out PFO -Discussed that IVC filter if PFO is positive -PT and OT consults are pending. Speech therapy evaluated the patient today.  #2 left leg DVT-diagnosed last week. On treatment doses of Xarelto.  #3 hypothyroidism-on methimazole. Check TSH and T4 levels.  #4 metastatic pancreatic cancer-diagnosed in March 2017. Receiving cycle 1 of gemcitabine. -Oncology consulted. Has metastases to liver and also  possible lung metastases.  #5 anemia-acute on chronic. Hemoglobin dropped from 10-8.6. Patient is on Xarelto and aspirin at this time. -Continue to monitor closely. No indication for transfusion. Also received recent chemotherapy.  #6 DVT prophylaxis-on Xarelto   All the records are reviewed and case discussed with Care Management/Social Workerr. Management plans discussed with the patient, family and they are in agreement.  CODE STATUS: Full Code  TOTAL TIME TAKING CARE OF THIS PATIENT: 41  minutes.   POSSIBLE D/C IN 1-2 DAYS, DEPENDING ON CLINICAL CONDITION.   Gladstone Lighter M.D on 04/05/2016 at 10:44 AM  Between 7am to 6pm - Pager - [phone]  After 6pm go to [email or web address] - password [email or web address] [facility]  Office  [phone]  CC: Primary care physician; BABAOFF, Caryl Bis, MD

## 2016-04-05 NOTE — Evaluation (Signed)
Physical Therapy Evaluation Patient Details Name: CY BARRISH MRN: [id number] DOB: 12/21/1932 Today's Date: 04/05/2016   History of Present Illness  Akio Simo is an 82 y.o. white male with a known history of remote TIA, pancreatic cancer (03/17) who comes to [facility] after acute visual changes; neuro reporting LHH, and MRI shows acute changes to R occipital lobe and parts of right parietal lobe. 3MA, pt was very active and fully indep, playing [employer], however in the last few weeks, high level s of DOE have prevented more than household distance ambulation. Pt has no DME.    Clinical Impression  At evaluation, pt is received semirecumbent in bed upon entry, no family/caregiver present. The pt is awake and agreeable to participate. No acute distress noted at this time. The pt is alert and oriented x3, pleasant, conversational, and following simple and multi-step commands consistently. Pt reports zero falls in the last 6 months, and no LOB during observation of functional mobility. Pt reports to be at baseline for strength, gait, and balance. Pt has experienced a recent decline in functional activity tolerance due to DOE, which may be due to lung mets, however should be reviewed with oncology. CC of visual field loss remains unchanged per patient. Testing of object recognition, line bisection, image cluster distinction, and color identification are all WNL. MMT and sensation testing reveal good symmetry, no acute impairment. No acute mobility deficits related to acute infarct findings. No additional acute PT services needed at this time. PT will sign off.       Follow Up Recommendations No PT follow up    Equipment Recommendations  None recommended by PT    Recommendations for Other Services       Precautions / Restrictions Precautions Precautions: Fall      Mobility  Bed Mobility Overal bed mobility: Independent                Transfers Overall transfer level:  Independent                  Ambulation/Gait Ambulation/Gait assistance: Independent Ambulation Distance (Feet): 60 Feet         General Gait Details: reports to be at baseline, no acute changes to balance, strength or mobility.   Stairs            Wheelchair Mobility    Modified Rankin (Stroke Patients Only)       Balance Overall balance assessment: Independent                                           Pertinent Vitals/Pain Pain Assessment: No/denies pain Pain Score: 0-No pain    Home Living Family/patient expects to be discharged to:: Private residence Living Arrangements: Alone Available Help at Discharge: Family;Available PRN/intermittently Type of Home: House Home Access: Stairs to enter Entrance Stairs-Rails: None Entrance Stairs-Number of Steps: 3 Home Layout: One level Home Equipment: None      Prior Function Level of Independence: Needs assistance   Gait / Transfers Assistance Needed: household distances only   ADL's / Homemaking Assistance Needed: 2 daughters adn wife assist with IADL and transportation needs.         Hand Dominance   Dominant Hand: Right    Extremity/Trunk Assessment   Upper Extremity Assessment: Overall WFL for tasks assessed           Lower Extremity  Assessment: LLE deficits/detail   LLE Deficits / Details: subacute LLE swelling in the setting of DVT (x6d) on Xarelto x1w.      Communication   Communication: No difficulties  Cognition Arousal/Alertness: Awake/alert Behavior During Therapy: WFL for tasks assessed/performed Overall Cognitive Status: Within Functional Limits for tasks assessed                      General Comments      Exercises        Assessment/Plan    PT Assessment    PT Diagnosis Generalized weakness   PT Problem List    PT Treatment Interventions     PT Goals (Current goals can be found in the Care Plan section) Acute Rehab PT Goals Patient  Stated Goal: Return to home;  PT Goal Formulation: All assessment and education complete, DC therapy    Frequency     Barriers to discharge        Co-evaluation               End of Session   Activity Tolerance: Patient tolerated treatment well Patient left: in bed Nurse Communication: Mobility status         Time: [id number] PT Time Calculation (min) (ACUTE ONLY): 12 min   Charges:   PT Evaluation $PT Eval Moderate Complexity: 1 Procedure PT Treatments $Therapeutic Activity: 8-22 mins   PT G Codes:        2:16 PM, Apr 29, 2016 Etta Grandchild, PT, DPT PRN Physical Therapist - [place] License # [id number] ([phone] (mobile)

## 2016-04-05 NOTE — Progress Notes (Signed)
Speech Therapy Note: received order, reviewed chart notes. Met w/ pt who denied any difficulty swallowing stating he was able to eat/drink "fine". Pt conversed at conversational level stating he felt his verbal communication was unchanged from his baseline; no apparent s/s of language deficits. Pt endorsed vision deficits as stated at admission.  Due to pt being at his baseline w/ speech and swallowing, no skilled ST services indicated at this time. NSG to reconsult if any change in status while admitted. Pt agreed. Educated pt on ordering meals, menu, and Stage manager. NSG consulted.

## 2016-04-05 NOTE — Progress Notes (Signed)
Initial Nutrition Assessment  DOCUMENTATION CODES:   Severe malnutrition in context of chronic illness  INTERVENTION:   -Cater to pt preferences, Agree with liberalizing diet order to Regular to optimize nutritional intake -Will send plastic utensils only as pt on chemo and prefers -Recommend Ensure Enlive po BID, each supplement provides 350 kcal and 20 grams of protein, mixed with ice cream as pt likes and drinks -Will send snacks of Magic Cup and strawberry yogurt   NUTRITION DIAGNOSIS:   Malnutrition related to chronic illness as evidenced by percent weight loss, energy intake < 75% for > or equal to 1 month, severe depletion of body fat, severe depletion of muscle mass.  GOAL:   Patient will meet greater than or equal to 90% of their needs  MONITOR:   PO intake, Supplement acceptance, Labs, Weight trends, I & O's  REASON FOR ASSESSMENT:   Malnutrition Screening Tool    ASSESSMENT:   Pt with h/o TIAs and pancreatic cancer on chemotherapy was admitted with left sided visual loss secondary to CVA. Neurology following. ECHO scheduled for 4/28. Per Nsg last chemotherapy was one week ago. RD also notes pt with recent h/o left leg DVT. Oncology consulted as well.  Past Medical History  Diagnosis Date  . TIA (transient ischemic attack)   . Hypertension   . Diverticulitis   . Cancer ([place])     pancreatic ca  . GERD (gastroesophageal reflux disease)   . History of hiatal hernia   . History of colonic polyps     Diet Order:  Diet regular Room service appropriate?: Yes; Fluid consistency:: Thin   Pt ate roughly 40% of breakfast this am, omelette and pineapple. Pt reports liking fresh pineapple recently.  Pt reports poor appetite for months. Pt reports diagnosis of Pancreatic cancer in March and his appetite had already been down for about a month prior. Pt reports trying to eat small frequent meals when able. Pt reports eating yogurts, ice creams, sandwiches for example but  smaller amounts than usual. Pt reports tolerating plastic utensils better secondary to chemo. Pt reports drinking Ensure with ice cream milkshakes PTA.  Medications: [facility] Labs: reviewed   Gastrointestinal Profile: Last BM: 04/03/2016, abdominal distension present per RN Velna Hatchet   Nutrition-Focused Physical Exam Findings: Nutrition-Focused physical exam completed. Findings are moderate-severe fat depletion, moderate-severe muscle depletion, and no edema.     Weight Change: Pt reports 30lbs weight loss in the past 3 months (16% weight loss in 3 months)   Skin:  Reviewed, no issues   Height:   Ht Readings from Last 1 Encounters:  04/04/16 5\' 11"  (1.803 m)    Weight:   Wt Readings from Last 1 Encounters:  04/04/16 155 lb (70.308 kg)   Wt Readings from Last 10 Encounters:  04/04/16 155 lb (70.308 kg)  03/29/16 158 lb 1.1 oz (71.7 kg)  03/28/16 156 lb 15.5 oz (71.2 kg)  03/21/16 160 lb 0.9 oz (72.6 kg)  03/14/16 158 lb 15.2 oz (72.1 kg)  03/13/16 158 lb (71.668 kg)  03/07/16 160 lb 2.6 oz (72.65 kg)  02/27/16 170 lb (77.111 kg)  02/24/16 170 lb (77.111 kg)  02/23/16 170 lb (77.111 kg)    BMI:  Body mass index is 21.63 kg/(m^2).  Estimated Nutritional Needs:   Kcal:  1888-2230kcals  Protein:  77-91g protein  Fluid:  >2L fluid  EDUCATION NEEDS:   No education needs identified at this time  Dwyane Luo, RD, LDN Pager [phone] Weekend/On-Call Pager [phone]

## 2016-04-05 NOTE — Evaluation (Addendum)
Occupational Therapy Evaluation Patient Details Name: Cody Andrade MRN: [id number] DOB: March 24, 1933 Today's Date: 04/05/2016    History of Present Illness Cody Andrade is a 82 y.o. male with a known history of TIA, pancreatic cancer who is presenting with left-sided visual loss.    Clinical Impression   Pt. Is an 82 y.o. Male who was admitted with Left sided visual field loss. Pt. presents with impaired left peripheral field loss which interferes with ADL/IADL functioning. Pt. Requires cues for scanning his environment to the far left. Pt. could benefit from skilled OT services to review visual compensatory techniques during ADLs, and IADLs. Pt. Has a DVT in the LLE, with increased edema.    Follow Up Recommendations  Home health OT    Equipment Recommendations       Recommendations for Other Services PT consult     Precautions / Restrictions Restrictions Weight Bearing Restrictions: No      Mobility Bed Mobility                  Transfers                      Balance                                            ADL                                      General ADL Comments: Pt. education was provided about visual compensatory strategies during ADLs: feeding, grooming, and daily IADLs.     Vision     Perception     Praxis      Pertinent Vitals/Pain Pain Assessment: No/denies pain Pain Score: 0-No pain     Hand Dominance     Extremity/Trunk Assessment Upper Extremity Assessment Upper Extremity Assessment: Overall WFL for tasks assessed  WNL: Proprioceptive awareness, and sensation to light touch.           Communication Communication Communication: No difficulties   Cognition                           General Comments       Exercises       Shoulder Instructions      Home Living Family/patient expects to be discharged to:: Private residence Living Arrangements:  Alone Available Help at Discharge: Family Type of Home: House Home Access: Stairs to enter Technical brewer of Steps: 3 Entrance Stairs-Rails: None Home Layout: One level     Bathroom Shower/Tub: Tub/shower unit;Tub only;Door   [employer]: Standard                Prior Functioning/Environment Level of Independence: Independent with assistive device(s)             OT Diagnosis: Generalized weakness;Disturbance of vision   OT Problem List: Decreased strength;Decreased activity tolerance;Impaired vision/perception   OT Treatment/Interventions: Self-care/ADL training;Therapeutic exercise;Therapeutic activities;DME and/or AE instruction;Patient/family education;Visual/perceptual remediation/compensation    OT Goals(Current goals can be found in the care plan section) Acute Rehab OT Goals Patient Stated Goal: To get better OT Goal Formulation: With patient Time For Goal Achievement: 04/26/16 Potential to Achieve Goals: Good ADL Goals Additional ADL Goal #1: Pt. will demonstrate Left peripheral  field visual compensatory strategies 100% of the time during ADLs, and IADLs.  OT Frequency: Min 1X/week   Barriers to D/C:            Co-evaluation              End of Session    Activity Tolerance: Patient tolerated treatment well Patient left: in bed;with call bell/phone within reach;with bed alarm set   Time: [id number] OT Time Calculation (min): 18 min Charges:  OT General Charges $OT Visit: 1 Procedure OT Evaluation $OT Eval Low Complexity: 1 Procedure G-Codes:    Harrel Carina, MS, OTR/L Harrel Carina 04/05/2016, 11:20 AM

## 2016-04-05 NOTE — Progress Notes (Signed)
*  PRELIMINARY RESULTS* Echocardiogram 2D Echocardiogram has been performed.  Cody Andrade 04/05/2016, 7:00 PM

## 2016-04-06 LAB — CBC
HEMATOCRIT: 26.6 % — AB (ref 40.0–52.0)
HEMOGLOBIN: 9 g/dL — AB (ref 13.0–18.0)
MCH: 31.7 pg (ref 26.0–34.0)
MCHC: 33.8 g/dL (ref 32.0–36.0)
MCV: 93.9 fL (ref 80.0–100.0)
Platelets: 145 10*3/uL — ABNORMAL LOW (ref 150–440)
RBC: 2.84 MIL/uL — ABNORMAL LOW (ref 4.40–5.90)
RDW: 15.9 % — AB (ref 11.5–14.5)
WBC: 9.2 10*3/uL (ref 3.8–10.6)

## 2016-04-06 MED ORDER — ENSURE ENLIVE PO LIQD: 237.0000 mL | Freq: Two times a day (BID) | ORAL | Status: AC

## 2016-04-06 MED ORDER — ATORVASTATIN CALCIUM 80 MG PO TABS: 80.0000 mg | ORAL_TABLET | Freq: Every day | ORAL | Status: DC

## 2016-04-06 MED ORDER — ASPIRIN 325 MG PO TABS: 325.0000 mg | ORAL_TABLET | Freq: Every day | ORAL | Status: AC

## 2016-04-06 NOTE — Discharge Summary (Signed)
[facility] NAME: Cody Andrade    MR#:  [id number]  DATE OF BIRTH:  [birth year]  DATE OF ADMISSION:  04/04/2016 ADMITTING PHYSICIAN: Lytle Butte, MD  DATE OF DISCHARGE:04/06/2016  PRIMARY CARE PHYSICIAN: BABAOFF, MARC E, MD    ADMISSION DIAGNOSIS:  Stroke ([place]) [I63.9] Stroke (cerebrum) ([place]) [I63.9] Cerebral infarction due to unspecified mechanism [I63.9]  DISCHARGE DIAGNOSIS:  Active Problems:   CVA (cerebral infarction)   SECONDARY DIAGNOSIS:   Past Medical History  Diagnosis Date  . TIA (transient ischemic attack)   . Hypertension   . Diverticulitis   . Cancer ([place])     pancreatic ca  . GERD (gastroesophageal reflux disease)   . History of hiatal hernia   . History of colonic polyps     HOSPITAL COURSE:   #1 Acute right occipital infarct- MRI of the brain with acute nonhemorrhagic infarct in the right occipital lobe extending onto the right temporal lobe. No associated hemorrhage noted. -Patient has hypercoagulable state due to his pancreatic cancer. Just started on Xarelto 6 days ago. He had the stroke in spite of being on Xarelto. -Appreciated neurology recommendations. Continue statin -CT angios with significant atherosclerosis, worse on the left side though the stroke is on the right side. -Echocardiogram not very helpful to rule out PFO,a s per phone discussion with Dr. Jens Som [facility] covering physician) but as per him pt is properly covered as there is no data to support warfarin's superiority over Xarelto.  -Discussed that IVC filter if PFO is positive -PT and OT consults are done, no further needs.  - as pt feels fine, will d/c home, and advised to follow with Dr. Melrose Nakayama in one week- he already follows with him in office, to decide further work up and plan about anticoagulation  #2 left leg DVT-diagnosed last week. On treatment doses of Xarelto.  #3 hypothyroidism-on methimazole. Checked TSH and T4  levels.  #4 metastatic pancreatic cancer-diagnosed in March 2017. Receiving cycle 1 of gemcitabine. -Oncology consulted. Has metastases to liver and also possible lung metastases.  #5 anemia-acute on chronic. Hemoglobin dropped from 10-8.6. Patient is on Xarelto and aspirin at this time. -Continue to monitor closely. No indication for transfusion. Also received recent chemotherapy.  #6 DVT prophylaxis-on Xarelto  DISCHARGE CONDITIONS:  Stable.  CONSULTS OBTAINED:  Treatment Team:  Lytle Butte, MD Catarina Hartshorn, MD Alexis Goodell, MD Lequita Asal, MD Vaughan Basta, MD  DRUG ALLERGIES:   Allergies  Allergen Reactions  . Lodine [Etodolac] Nausea And Vomiting    DISCHARGE MEDICATIONS:   Current Discharge Medication List    START taking these medications   Details  aspirin 325 MG tablet Take 1 tablet (325 mg total) by mouth daily. Qty: 30 tablet, Refills: 0    atorvastatin (LIPITOR) 80 MG tablet Take 1 tablet (80 mg total) by mouth daily at 6 PM. Qty: 30 tablet, Refills: 0    feeding supplement, ENSURE ENLIVE, (ENSURE ENLIVE) LIQD Take 237 mLs by mouth 2 (two) times daily between meals. Qty: 237 mL, Refills: 12      CONTINUE these medications which have NOT CHANGED   Details  lidocaine-prilocaine (EMLA) cream Apply 1 application topically as needed. Apply 1/2 tablespoon 1-2 hours prior port being used and cover Qty: 30 g, Refills: 0    magnesium hydroxide (MILK OF MAGNESIA) 400 MG/5ML suspension Take 30 mLs by mouth daily as needed for mild constipation.    megestrol (MEGACE)  400 MG/10ML suspension Take 5 mLs (200 mg total) by mouth daily. Qty: 150 mL, Refills: 1   Associated Diagnoses: Malignant neoplasm of head of pancreas ([place]); Anemia, unspecified anemia type    methimazole (TAPAZOLE) 10 MG tablet Take 5 mg by mouth daily. Reported on 03/29/2016   Associated Diagnoses: Malignant neoplasm of head of pancreas ([place]); DVT (deep venous thrombosis),  left    ondansetron (ZOFRAN-ODT) 4 MG disintegrating tablet Take 1 tablet (4 mg total) by mouth every 6 (six) hours as needed for nausea. Reported on 02/14/2016 Qty: 30 tablet, Refills: 2   Associated Diagnoses: Malignant neoplasm of head of pancreas ([place]); Anemia, unspecified anemia type    oxyCODONE (OXY IR/ROXICODONE) 5 MG immediate release tablet Take 1 tablet (5 mg total) by mouth every 4 (four) hours as needed for moderate pain. Qty: 40 tablet, Refills: 0   Associated Diagnoses: Malignant neoplasm of head of pancreas ([place]); Anemia, unspecified anemia type    Rivaroxaban (XARELTO) 15 MG TABS tablet Take 15 mg by mouth 2 (two) times daily with a meal.    simethicone (MYLICON) 80 MG chewable tablet Chew 80 mg by mouth every 6 (six) hours as needed for flatulence.    zolpidem (AMBIEN) 5 MG tablet Take 1 tablet (5 mg total) by mouth at bedtime as needed for sleep. Qty: 30 tablet, Refills: 0   Associated Diagnoses: Malignant neoplasm of head of pancreas ([place]); Anemia, unspecified anemia type         DISCHARGE INSTRUCTIONS:    Follow with neurology clinic in 1 week.  If you experience worsening of your admission symptoms, develop shortness of breath, life threatening emergency, suicidal or homicidal thoughts you must seek medical attention immediately by calling 911 or calling your MD immediately  if symptoms less severe.  You Must read complete instructions/literature along with all the possible adverse reactions/side effects for all the Medicines you take and that have been prescribed to you. Take any new Medicines after you have completely understood and accept all the possible adverse reactions/side effects.   Please note  You were cared for by a hospitalist during your hospital stay. If you have any questions about your discharge medications or the care you received while you were in the hospital after you are discharged, you can call the unit and asked to speak with the  hospitalist on call if the hospitalist that took care of you is not available. Once you are discharged, your primary care physician will handle any further medical issues. Please note that NO REFILLS for any discharge medications will be authorized once you are discharged, as it is imperative that you return to your primary care physician (or establish a relationship with a primary care physician if you do not have one) for your aftercare needs so that they can reassess your need for medications and monitor your lab values.    Today   CHIEF COMPLAINT:   Chief Complaint  Patient presents with  . Loss of Vision    HISTORY OF PRESENT ILLNESS:  Arch Plant  is a 82 y.o. male with a known history of TIA, pancreatic cancer who is presenting with left-sided visual loss. Last known normal 22:30 04/03/2016 , patient woke this morning noticed he had vision loss. Present Hospital further workup and evaluation. Denies further symptomatology denies any focal weakness he thinks that his vision is slightly improving though not yet at baseline.  Of note patient found to have left lower extremity DVT started on Xarelto one week  ago   VITAL SIGNS:  Blood pressure 113/61, pulse 91, temperature 99.5 F (37.5 C), temperature source Oral, resp. rate 18, height 5\' 11"  (1.803 m), weight 70.308 kg (155 lb), SpO2 99 %.  I/O:   Intake/Output Summary (Last 24 hours) at 04/06/16 1219 Last data filed at 04/06/16 0800  Gross per 24 hour  Intake    600 ml  Output      0 ml  Net    600 ml    PHYSICAL EXAMINATION:   GENERAL: 82 y.o.-year-old patient lying in the bed with no acute distress.  EYES: Pupils equal, round, reactive to light and accommodation. No scleral icterus. Extraocular muscles intact.  HEENT: Head atraumatic, normocephalic. Oropharynx and nasopharynx clear.  NECK: Supple, no jugular venous distention. No thyroid enlargement, no tenderness.  LUNGS: Normal breath sounds bilaterally, no  wheezing, rales,rhonchi or crepitation. No use of accessory muscles of respiration.  CARDIOVASCULAR: S1, S2 normal. No murmurs, rubs, or gallops.  ABDOMEN: Soft, nontender, nondistended. Bowel sounds present. No organomegaly or mass.  EXTREMITIES: No pedal edema on right, cyanosis, or clubbing. Left leg swollen NEUROLOGIC: Left-sided homonymous hemianopsia, other than that Cranial nerves II through XII are intact. Muscle strength 5/5 in all extremities. Sensation intact. Gait not checked.  PSYCHIATRIC: The patient is alert and oriented x 3.  SKIN: No obvious rash, lesion, or ulcer.   DATA REVIEW:   CBC  Recent Labs Lab 04/06/16 0423  WBC 9.2  HGB 9.0*  HCT 26.6*  PLT 145*    Chemistries   Recent Labs Lab 04/04/16 1111 04/05/16 0558  NA 134* 136  K 3.9 4.0  CL 103 105  CO2 21* 21*  GLUCOSE 118* 89  BUN 24* 21*  CREATININE 1.00 0.88  CALCIUM 8.6* 8.2*  AST 43*  --   ALT 50  --   ALKPHOS 511*  --   BILITOT 1.0  --     Cardiac Enzymes No results for input(s): TROPONINI in the last 168 hours.  Microbiology Results  Results for orders placed or performed during the hospital encounter of 02/23/16  Surgical pcr screen     Status: None   Collection Time: 02/23/16 11:41 AM  Result Value Ref Range Status   MRSA, PCR NEGATIVE NEGATIVE Final   Staphylococcus aureus NEGATIVE NEGATIVE Final    Comment:        The Xpert SA Assay (FDA approved for NASAL specimens in patients over 33 years of age), is one component of a comprehensive surveillance program.  Test performance has been validated by [facility] for patients greater than or equal to 73 year old. It is not intended to diagnose infection nor to guide or monitor treatment.     RADIOLOGY:  Ct Angio Head W/cm &/or Wo Cm  04/04/2016  CLINICAL DATA:  82 year old male with left eye peripheral vision loss since waking today. Initial encounter. Pancreatic cancer. EXAM: CT ANGIOGRAPHY HEAD AND NECK TECHNIQUE:  Multidetector CT imaging of the head and neck was performed using the standard protocol during bolus administration of intravenous contrast. Multiplanar CT image reconstructions and MIPs were obtained to evaluate the vascular anatomy. Carotid stenosis measurements (when applicable) are obtained utilizing NASCET criteria, using the distal internal carotid diameter as the denominator. CONTRAST:  75 mL Isovue 370. COMPARISON:  Head CT without contrast 1103 hours today. PET-CT 02/15/2016 FINDINGS: CTA NECK Skeleton: No acute osseous abnormality identified. Degenerative changes in the cervical spine with moderate to severe degenerative spinal stenosis that C5-C6. Other neck:  Left chest porta cath, subclavian approach. Centrilobular and paraseptal emphysema. Apical lung scarring. At the level of the right hilum there is partially visible new peribronchial nodularity in spiculated opacity (series 4, image 11). There is fairly widespread fine nodular opacity in the right upper lobe which seems to be new since March. Prevascular superior mediastinal metastatic disease appears stable to mildly increased (series 4, image 68). Superimposed large heterogeneous thyroid goiter with superior mediastinal extension and dystrophic calcifications. Larynx, pharynx, parapharyngeal spaces, retropharyngeal space, sublingual space, submandibular glands, and parotid glands are within normal limits. Small metastatic nodes anterior to the left subclavian artery re- demonstrated (series 4, image 91) an may be mildly progressed. Aortic arch: 3 vessel arch configuration. Moderate soft and calcified arch atherosclerosis. No great vessel origin stenosis. Right carotid system: Negative right CCA. Calcified plaque at the posterior right ICA and bulb. No right ICA origin stenosis. At the level of the distal bulb there is additional soft plaque, but subsequent stenosis is less than 50 % with respect to the distal vessel. See series 9, image 57). Right  ICA remains patent to the skullbase. Left carotid system: No left CCA origin stenosis. Negative left CCA. Mild plaque at the left carotid bifurcation without stenosis. Negative cervical left ICA. Vertebral arteries:No proximal right subclavian artery stenosis despite plaque. Normal right vertebral artery origin. Mildly dominant right vertebral artery is normal to the skullbase. No proximal left subclavian artery stenosis despite plaque. Dense calcified plaque at the left vertebral artery origin resulting in severe left vertebral artery origin stenosis. Non dominant left vertebral artery remains patent and is otherwise negative to the skullbase. CTA HEAD Posterior circulation: Patent distal vertebral arteries, the right is dominant. Normal PICA origins. Normal vertebrobasilar junction. No basilar artery stenosis. Normal SCA and PCA origins. Right posterior communicating artery is present, the left is diminutive or absent. The distal left PCA branches appear occluded in the P3 division (series 10, image 23). Right PCA branches are within normal limits. Anterior circulation: Both ICA siphons are patent without significant plaque or stenosis. Both ophthalmic arteries appear to remain patent. Patent carotid termini. Normal MCA and ACA origins. Anterior communicating artery and bilateral ACA branches are within normal limits. Left MCA M1 segment, bifurcation, and left MCA branches are within normal limits. Right MCA M1 segment, bifurcation, and right MCA branches are within normal limits. Venous sinuses: Patent. Anatomic variants: Dominant right vertebral artery. Delayed phase: No acute cortically based infarct identified, including in the left PCA territory. No abnormal enhancement identified. No midline shift, mass effect, or evidence of intracranial mass lesion. IMPRESSION: 1. Negative for emergent large vessel occlusion, but suggestion of occluded distal left PCA branches. However, there is no CT evidence of left PCA  territory ischemia at this time. 2. Moderate to severe atherosclerotic stenosis of the left vertebral artery origin. Otherwise aortic arch and neck atherosclerosis without hemodynamically significant stenosis. 3. Abnormal right upper lobe nodular and spiculated opacities appear to be new since March. Top differential considerations in this setting include metastatic disease and acute respiratory infection. 4. Metastatic nodal disease at the left thoracic inlet and superior mediastinum. Electronically Signed   By: Genevie Ann M.D.   On: 04/04/2016 14:11   Ct Angio Neck W/cm &/or Wo/cm  04/04/2016  CLINICAL DATA:  82 year old male with left eye peripheral vision loss since waking today. Initial encounter. Pancreatic cancer. EXAM: CT ANGIOGRAPHY HEAD AND NECK TECHNIQUE: Multidetector CT imaging of the head and neck was performed using the standard protocol during  bolus administration of intravenous contrast. Multiplanar CT image reconstructions and MIPs were obtained to evaluate the vascular anatomy. Carotid stenosis measurements (when applicable) are obtained utilizing NASCET criteria, using the distal internal carotid diameter as the denominator. CONTRAST:  75 mL Isovue 370. COMPARISON:  Head CT without contrast 1103 hours today. PET-CT 02/15/2016 FINDINGS: CTA NECK Skeleton: No acute osseous abnormality identified. Degenerative changes in the cervical spine with moderate to severe degenerative spinal stenosis that C5-C6. Other neck: Left chest porta cath, subclavian approach. Centrilobular and paraseptal emphysema. Apical lung scarring. At the level of the right hilum there is partially visible new peribronchial nodularity in spiculated opacity (series 4, image 11). There is fairly widespread fine nodular opacity in the right upper lobe which seems to be new since March. Prevascular superior mediastinal metastatic disease appears stable to mildly increased (series 4, image 68). Superimposed large heterogeneous  thyroid goiter with superior mediastinal extension and dystrophic calcifications. Larynx, pharynx, parapharyngeal spaces, retropharyngeal space, sublingual space, submandibular glands, and parotid glands are within normal limits. Small metastatic nodes anterior to the left subclavian artery re- demonstrated (series 4, image 91) an may be mildly progressed. Aortic arch: 3 vessel arch configuration. Moderate soft and calcified arch atherosclerosis. No great vessel origin stenosis. Right carotid system: Negative right CCA. Calcified plaque at the posterior right ICA and bulb. No right ICA origin stenosis. At the level of the distal bulb there is additional soft plaque, but subsequent stenosis is less than 50 % with respect to the distal vessel. See series 9, image 57). Right ICA remains patent to the skullbase. Left carotid system: No left CCA origin stenosis. Negative left CCA. Mild plaque at the left carotid bifurcation without stenosis. Negative cervical left ICA. Vertebral arteries:No proximal right subclavian artery stenosis despite plaque. Normal right vertebral artery origin. Mildly dominant right vertebral artery is normal to the skullbase. No proximal left subclavian artery stenosis despite plaque. Dense calcified plaque at the left vertebral artery origin resulting in severe left vertebral artery origin stenosis. Non dominant left vertebral artery remains patent and is otherwise negative to the skullbase. CTA HEAD Posterior circulation: Patent distal vertebral arteries, the right is dominant. Normal PICA origins. Normal vertebrobasilar junction. No basilar artery stenosis. Normal SCA and PCA origins. Right posterior communicating artery is present, the left is diminutive or absent. The distal left PCA branches appear occluded in the P3 division (series 10, image 23). Right PCA branches are within normal limits. Anterior circulation: Both ICA siphons are patent without significant plaque or stenosis. Both  ophthalmic arteries appear to remain patent. Patent carotid termini. Normal MCA and ACA origins. Anterior communicating artery and bilateral ACA branches are within normal limits. Left MCA M1 segment, bifurcation, and left MCA branches are within normal limits. Right MCA M1 segment, bifurcation, and right MCA branches are within normal limits. Venous sinuses: Patent. Anatomic variants: Dominant right vertebral artery. Delayed phase: No acute cortically based infarct identified, including in the left PCA territory. No abnormal enhancement identified. No midline shift, mass effect, or evidence of intracranial mass lesion. IMPRESSION: 1. Negative for emergent large vessel occlusion, but suggestion of occluded distal left PCA branches. However, there is no CT evidence of left PCA territory ischemia at this time. 2. Moderate to severe atherosclerotic stenosis of the left vertebral artery origin. Otherwise aortic arch and neck atherosclerosis without hemodynamically significant stenosis. 3. Abnormal right upper lobe nodular and spiculated opacities appear to be new since March. Top differential considerations in this setting include metastatic disease and  acute respiratory infection. 4. Metastatic nodal disease at the left thoracic inlet and superior mediastinum. Electronically Signed   By: Genevie Ann M.D.   On: 04/04/2016 14:11   Mr Brain Wo Contrast  04/04/2016  CLINICAL DATA:  82 year old hypertensive male with pancreatic cancer presenting with left-sided visual loss. Subsequent encounter. EXAM: MRI HEAD WITHOUT CONTRAST TECHNIQUE: Multiplanar, multiecho pulse sequences of the brain and surrounding structures were obtained without intravenous contrast. COMPARISON:  04/04/2016 CT angiogram and CT head.  2014 brain MR. FINDINGS: Acute nonhemorrhagic infarct medial right occipital lobe extending to posterior medial right temporal lobe. No associated hemorrhage. Slow flow within adjacent vessel. Tiny area blood breakdown  products left periventricular region probably related to prior episode hemorrhagic ischemia. Minimal small vessel disease changes. Global atrophy without hydrocephalus. Major intracranial vascular structures are patent. No intracranial mass lesion noted on this unenhanced exam. No acute orbital abnormality. Cervical medullary junction unremarkable. Mild cervical spondylotic changes C3-4. 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 No associated hemorrhage. Electronically Signed   By: Genia Del M.D.   On: 04/04/2016 17:40    EKG:   Orders placed or performed during the hospital encounter of 04/04/16  . ED EKG  . ED EKG      Management plans discussed with the patient, family and they are in agreement.  CODE STATUS:     Code Status Orders        Start     Ordered   04/04/16 1200  Full code   Continuous     04/04/16 1159    Code Status History    Date Active Date Inactive Code Status Order ID Comments User Context   02/09/2016 12:32 AM 02/11/2016  4:52 PM Full Code [id number]  Fritzi Mandes, MD Inpatient    Advance Directive Documentation        Most Recent Value   Type of Advance Directive  Healthcare Power of [place], Living will   Pre-existing out of facility DNR order (yellow form or pink MOST form)     "MOST" Form in Place?        TOTAL TIME TAKING CARE OF THIS PATIENT: 35 minutes.    Vaughan Basta M.D on 04/06/2016 at 12:19 PM  Between 7am to 6pm - Pager - [phone]  After 6pm go to [email or web address] - password [email or web address] [facility]  Office  [phone]  CC: Primary care physician; BABAOFF, Caryl Bis, MD   Note: This dictation was prepared with Dragon dictation along with smaller phrase technology. Any transcriptional errors that result from this process are unintentional.

## 2016-04-06 NOTE — Consult Note (Addendum)
[facility]  Date of admission:  04/04/2016  Inpatient day:  04/05/2016  Consulting physician:  Gladstone Lighter, MD   Reason for Consultation:  Metastatic pancreatic cancer  Chief Complaint: Cody Andrade is a 82 y.o. male with metastatic pancreatic cancer who was admitted through the emergency room with was admitted with a CVA.  HPI:  The patient was diagnosed with metastatic pancreatic cancer on 03/04/2016.  He completed 1 cycle of gemcitabine.  On 03/29/2016, he was diagnosed with a DVT in the left common femoral vein.  He was placed on Xarelto.  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  He awoke on 04/04/2016 with left sided visual loss, similar to prior TIA's.  He presented to the oncology clinic for follow-up labs.  Because of these symptoms, he was directed to the emergency room for imaging studies.  Head MRI on 04/04/2016 revealed acute nonhemorrhagic infarct medial right occipital lobe extending to posterior medial right temporal lobe.  There was no associated hemorrhage.  CT angiography of the head and neck revealed occluded distal left PCA branches.  There was moderate to severe atherosclerotic stenosis of the left vertebral artery origin.  Patient has been seen by neurology.  Echocardiogram pending.  Left homonymous hemianopsia persistent. Patient has no other neurologic deficits.   Past Medical History  Diagnosis Date  . TIA (transient ischemic attack)   . Hypertension   . Diverticulitis   . Cancer ([place])     pancreatic ca  . GERD (gastroesophageal reflux disease)   . History of hiatal hernia   . History of colonic polyps     Past Surgical History  Procedure Laterality Date  . Hernia repair    . Ercp N/A 02/09/2016    Procedure: ENDOSCOPIC RETROGRADE CHOLANGIOPANCREATOGRAPHY (ERCP);  Surgeon: Lucilla Lame, MD;  Location: [facility] ENDOSCOPY;  Service: Endoscopy;  Laterality: N/A;  . Skin cancer excision      x 3  . Colonoscopy    .  Esophagogastroduodenoscopy    . Portacath placement Left 02/27/2016    Procedure: INSERTION PORT-A-CATH;  Surgeon: Christene Lye, MD;  Location: [facility] ORS;  Service: General;  Laterality: Left;    Family History  Problem Relation Age of Onset  . Cancer Father 96    lung  . Breast cancer Maternal Aunt   . Brain cancer Maternal Aunt     Social History:  reports that he quit smoking about 41 years ago. He has never used smokeless tobacco. He reports that he drinks alcohol. He reports that he does not use illicit drugs.  The patient is alone today.  Allergies:  Allergies  Allergen Reactions  . Lodine [Etodolac] Nausea And Vomiting    Medications Prior to Admission  Medication Sig Dispense Refill  . lidocaine-prilocaine (EMLA) cream Apply 1 application topically as needed. Apply 1/2 tablespoon 1-2 hours prior port being used and cover 30 g 0  . magnesium hydroxide (MILK OF MAGNESIA) 400 MG/5ML suspension Take 30 mLs by mouth daily as needed for mild constipation.    . megestrol (MEGACE) 400 MG/10ML suspension Take 5 mLs (200 mg total) by mouth daily. 150 mL 1  . methimazole (TAPAZOLE) 10 MG tablet Take 5 mg by mouth daily. Reported on 03/29/2016    . ondansetron (ZOFRAN-ODT) 4 MG disintegrating tablet Take 1 tablet (4 mg total) by mouth every 6 (six) hours as needed for nausea. Reported on 02/14/2016 30 tablet 2  . oxyCODONE (OXY IR/ROXICODONE) 5 MG immediate  release tablet Take 1 tablet (5 mg total) by mouth every 4 (four) hours as needed for moderate pain. 40 tablet 0  . Rivaroxaban (XARELTO) 15 MG TABS tablet Take 15 mg by mouth 2 (two) times daily with a meal.    . simethicone (MYLICON) 80 MG chewable tablet Chew 80 mg by mouth every 6 (six) hours as needed for flatulence.    Marland Kitchen zolpidem (AMBIEN) 5 MG tablet Take 1 tablet (5 mg total) by mouth at bedtime as needed for sleep. 30 tablet 0    Review of Systems: GENERAL: Fatigue, slightly improved. No fevers or sweats. PERFORMANCE  STATUS (ECOG): 1-2 HEENT: left visual field cut.  Seasonal allergies. No sore throat, mouth sores or tenderness. Lungs: Shortness of breath with exertion.  No cough. No hemoptysis. Cardiac: No chest pain, palpitations, orthopnea, or PND. GI: Appetite 50%. Epigastric discomfort. No vomiting, diarrhea, constipation, melena or hematochezia. GU: No urgency, frequency, dysuria, or hematuria. Musculoskeletal: Back pain. No joint pain. No muscle tenderness. Extremities: No pain or swelling. Skin: No rashes or skin changes. Neuro: General weakness. Left homonymous hemianopsia. No headache, numbness or weakness, balance or coordination issues. Endocrine: No diabetes, thyroid issues, hot flashes or night sweats. Psych: No anxiety. Pain: Upper abdominal pain. Review of systems: All other systems reviewed and found to be negative  Physical Exam:  Blood pressure 137/57, pulse 99, temperature 98.5 F (36.9 C), temperature source Oral, resp. rate 20, height 5' 11" (1.803 m), weight 155 lb (70.308 kg), SpO2 98 %.  GENERAL: Well developed, well nourished, sitting comfortably on the medical unit eating breakfast in no acute distress. MENTAL STATUS: Alert and oriented to person, place and time. HEAD: Pearline Cables hair. Normocephalic, atraumatic, face symmetric, no Cushingoid features. EYES: Glasses. Hazel eyes. Pupils equal round and reactive to light and accomodation. No conjunctivitis or scleral icterus. ENT: Oropharynx clear without lesion. Tongue normal. Mucous membranes moist. RESPIRATORY: Clear to auscultation without rales, wheezes or rhonchi. CARDIOVASCULAR: Regular rate and rhythm without murmur, rub or gallop. ABDOMEN: Soft, non-tender, with active bowel sounds, and no hepatosplenomegaly. No masses. SKIN: No rashes, ulcers or lesions. EXTREMITIES:  2+ left lower extremity edema. No skin discoloration or tenderness. No palpable cords. LYMPH NODES: No palpable  cervical, supraclavicular, axillary or inguinal adenopathy  NEUROLOGICAL: Left homonymous hemianopsia.  Otherwise, at baseline. PSYCH: Appropriate.   Results for orders placed or performed during the hospital encounter of 04/04/16 (from the past 48 hour(s))  Protime-INR     Status: Abnormal   Collection Time: 04/04/16 11:11 AM  Result Value Ref Range   Prothrombin Time 28.4 (H) 11.4 - 15.0 seconds   INR 2.72   APTT     Status: Abnormal   Collection Time: 04/04/16 11:11 AM  Result Value Ref Range   aPTT 38 (H) 24 - 36 seconds    Comment:        IF BASELINE aPTT IS ELEVATED, SUGGEST PATIENT RISK ASSESSMENT BE USED TO DETERMINE APPROPRIATE ANTICOAGULANT THERAPY.   CBC     Status: Abnormal   Collection Time: 04/04/16 11:11 AM  Result Value Ref Range   WBC 9.3 3.8 - 10.6 K/uL   RBC 3.30 (L) 4.40 - 5.90 MIL/uL   Hemoglobin 10.3 (L) 13.0 - 18.0 g/dL   HCT 31.0 (L) 40.0 - 52.0 %   MCV 94.1 80.0 - 100.0 fL   MCH 31.2 26.0 - 34.0 pg   MCHC 33.2 32.0 - 36.0 g/dL   RDW 16.2 (H) 11.5 - 14.5 %  Platelets 139 (L) 150 - 440 K/uL  Differential     Status: Abnormal   Collection Time: 04/04/16 11:11 AM  Result Value Ref Range   Neutrophils Relative % 85 %   Neutro Abs 7.9 (H) 1.4 - 6.5 K/uL   Lymphocytes Relative 5 %   Lymphs Abs 0.5 (L) 1.0 - 3.6 K/uL   Monocytes Relative 8 %   Monocytes Absolute 0.8 0.2 - 1.0 K/uL   Eosinophils Relative 1 %   Eosinophils Absolute 0.1 0 - 0.7 K/uL   Basophils Relative 1 %   Basophils Absolute 0.1 0 - 0.1 K/uL  Comprehensive metabolic panel     Status: Abnormal   Collection Time: 04/04/16 11:11 AM  Result Value Ref Range   Sodium 134 (L) 135 - 145 mmol/L   Potassium 3.9 3.5 - 5.1 mmol/L   Chloride 103 101 - 111 mmol/L   CO2 21 (L) 22 - 32 mmol/L   Glucose, Bld 118 (H) 65 - 99 mg/dL   BUN 24 (H) 6 - 20 mg/dL   Creatinine, Ser 1.00 0.61 - 1.24 mg/dL   Calcium 8.6 (L) 8.9 - 10.3 mg/dL   Total Protein 6.1 (L) 6.5 - 8.1 g/dL   Albumin 3.1 (L) 3.5  - 5.0 g/dL   AST 43 (H) 15 - 41 U/L   ALT 50 17 - 63 U/L   Alkaline Phosphatase 511 (H) 38 - 126 U/L   Total Bilirubin 1.0 0.3 - 1.2 mg/dL   GFR calc non Af Amer >60 >60 mL/min   GFR calc Af Amer >60 >60 mL/min    Comment: (NOTE) The eGFR has been calculated using the CKD EPI equation. This calculation has not been validated in all clinical situations. eGFR's persistently <60 mL/min signify possible Chronic Kidney Disease.    Anion gap 10 5 - 15  Urine Drug Screen, Qualitative ([facility] only)     Status: None   Collection Time: 04/04/16 11:40 AM  Result Value Ref Range   Tricyclic, Ur Screen NONE DETECTED NONE DETECTED   Amphetamines, Ur Screen NONE DETECTED NONE DETECTED   MDMA (Ecstasy)Ur Screen NONE DETECTED NONE DETECTED   Cocaine Metabolite,Ur [place] NONE DETECTED NONE DETECTED   Opiate, Ur Screen NONE DETECTED NONE DETECTED   Phencyclidine (PCP) Ur S NONE DETECTED NONE DETECTED   Cannabinoid 50 Ng, Ur [place] NONE DETECTED NONE DETECTED   Barbiturates, Ur Screen NONE DETECTED NONE DETECTED   Benzodiazepine, Ur Scrn NONE DETECTED NONE DETECTED   Methadone Scn, Ur NONE DETECTED NONE DETECTED    Comment: (NOTE) 017  Tricyclics, urine               Cutoff 1000 ng/mL 200  Amphetamines, urine             Cutoff 1000 ng/mL 300  MDMA (Ecstasy), urine           Cutoff 500 ng/mL 400  Cocaine Metabolite, urine       Cutoff 300 ng/mL 500  Opiate, urine                   Cutoff 300 ng/mL 600  Phencyclidine (PCP), urine      Cutoff 25 ng/mL 700  Cannabinoid, urine              Cutoff 50 ng/mL 800  Barbiturates, urine             Cutoff 200 ng/mL 900  Benzodiazepine, urine  Cutoff 200 ng/mL 1000 Methadone, urine                Cutoff 300 ng/mL 1100 1200 The urine drug screen provides only a preliminary, unconfirmed 1300 analytical test result and should not be used for non-medical 1400 purposes. Clinical consideration and professional judgment should 1500 be applied to any positive drug  screen result due to possible 1600 interfering substances. A more specific alternate chemical method 1700 must be used in order to obtain a confirmed analytical result.  1800 Gas chromato graphy / mass spectrometry (GC/MS) is the preferred 1900 confirmatory method.   Basic metabolic panel     Status: Abnormal   Collection Time: 04/05/16  5:58 AM  Result Value Ref Range   Sodium 136 135 - 145 mmol/L   Potassium 4.0 3.5 - 5.1 mmol/L   Chloride 105 101 - 111 mmol/L   CO2 21 (L) 22 - 32 mmol/L   Glucose, Bld 89 65 - 99 mg/dL   BUN 21 (H) 6 - 20 mg/dL   Creatinine, Ser 0.88 0.61 - 1.24 mg/dL   Calcium 8.2 (L) 8.9 - 10.3 mg/dL   GFR calc non Af Amer >60 >60 mL/min   GFR calc Af Amer >60 >60 mL/min    Comment: (NOTE) The eGFR has been calculated using the CKD EPI equation. This calculation has not been validated in all clinical situations. eGFR's persistently <60 mL/min signify possible Chronic Kidney Disease.    Anion gap 10 5 - 15  CBC     Status: Abnormal   Collection Time: 04/05/16  5:58 AM  Result Value Ref Range   WBC 8.5 3.8 - 10.6 K/uL   RBC 2.69 (L) 4.40 - 5.90 MIL/uL   Hemoglobin 8.6 (L) 13.0 - 18.0 g/dL   HCT 25.1 (L) 40.0 - 52.0 %   MCV 93.4 80.0 - 100.0 fL   MCH 31.9 26.0 - 34.0 pg   MCHC 34.2 32.0 - 36.0 g/dL   RDW 15.7 (H) 11.5 - 14.5 %   Platelets 125 (L) 150 - 440 K/uL  Lipid panel     Status: Abnormal   Collection Time: 04/05/16  5:58 AM  Result Value Ref Range   Cholesterol 132 0 - 200 mg/dL   Triglycerides 90 <150 mg/dL   HDL 36 (L) >40 mg/dL   Total CHOL/HDL Ratio 3.7 RATIO   VLDL 18 0 - 40 mg/dL   LDL Cholesterol 78 0 - 99 mg/dL    Comment:        Total Cholesterol/HDL:CHD Risk Coronary Heart Disease Risk Table                     Men   Women  1/2 Average Risk   3.4   3.3  Average Risk       5.0   4.4  2 X Average Risk   9.6   7.1  3 X Average Risk  23.4   11.0        Use the calculated Patient Ratio above and the CHD Risk Table to determine  the patient's CHD Risk.        ATP III CLASSIFICATION (LDL):  <100     mg/dL   Optimal  100-129  mg/dL   Near or Above                    Optimal  130-159  mg/dL   Borderline  160-189  mg/dL   High  >  190     mg/dL   Very High   Hemoglobin A1c     Status: None   Collection Time: 04/05/16  5:58 AM  Result Value Ref Range   Hgb A1c MFr Bld 5.6 4.0 - 6.0 %  TSH     Status: Abnormal   Collection Time: 04/05/16  5:58 AM  Result Value Ref Range   TSH 0.154 (L) 0.350 - 4.500 uIU/mL  T4, free     Status: Abnormal   Collection Time: 04/05/16  5:58 AM  Result Value Ref Range   Free T4 2.13 (H) 0.61 - 1.12 ng/dL   Ct Angio Head W/cm &/or Wo Cm  04/04/2016  CLINICAL DATA:  82 year old male with left eye peripheral vision loss since waking today. Initial encounter. Pancreatic cancer. EXAM: CT ANGIOGRAPHY HEAD AND NECK TECHNIQUE: Multidetector CT imaging of the head and neck was performed using the standard protocol during bolus administration of intravenous contrast. Multiplanar CT image reconstructions and MIPs were obtained to evaluate the vascular anatomy. Carotid stenosis measurements (when applicable) are obtained utilizing NASCET criteria, using the distal internal carotid diameter as the denominator. CONTRAST:  75 mL Isovue 370. COMPARISON:  Head CT without contrast 1103 hours today. PET-CT 02/15/2016 FINDINGS: CTA NECK Skeleton: No acute osseous abnormality identified. Degenerative changes in the cervical spine with moderate to severe degenerative spinal stenosis that C5-C6. Other neck: Left chest porta cath, subclavian approach. Centrilobular and paraseptal emphysema. Apical lung scarring. At the level of the right hilum there is partially visible new peribronchial nodularity in spiculated opacity (series 4, image 11). There is fairly widespread fine nodular opacity in the right upper lobe which seems to be new since March. Prevascular superior mediastinal metastatic disease appears stable to  mildly increased (series 4, image 68). Superimposed large heterogeneous thyroid goiter with superior mediastinal extension and dystrophic calcifications. Larynx, pharynx, parapharyngeal spaces, retropharyngeal space, sublingual space, submandibular glands, and parotid glands are within normal limits. Small metastatic nodes anterior to the left subclavian artery re- demonstrated (series 4, image 91) an may be mildly progressed. Aortic arch: 3 vessel arch configuration. Moderate soft and calcified arch atherosclerosis. No great vessel origin stenosis. Right carotid system: Negative right CCA. Calcified plaque at the posterior right ICA and bulb. No right ICA origin stenosis. At the level of the distal bulb there is additional soft plaque, but subsequent stenosis is less than 50 % with respect to the distal vessel. See series 9, image 57). Right ICA remains patent to the skullbase. Left carotid system: No left CCA origin stenosis. Negative left CCA. Mild plaque at the left carotid bifurcation without stenosis. Negative cervical left ICA. Vertebral arteries:No proximal right subclavian artery stenosis despite plaque. Normal right vertebral artery origin. Mildly dominant right vertebral artery is normal to the skullbase. No proximal left subclavian artery stenosis despite plaque. Dense calcified plaque at the left vertebral artery origin resulting in severe left vertebral artery origin stenosis. Non dominant left vertebral artery remains patent and is otherwise negative to the skullbase. CTA HEAD Posterior circulation: Patent distal vertebral arteries, the right is dominant. Normal PICA origins. Normal vertebrobasilar junction. No basilar artery stenosis. Normal SCA and PCA origins. Right posterior communicating artery is present, the left is diminutive or absent. The distal left PCA branches appear occluded in the P3 division (series 10, image 23). Right PCA branches are within normal limits. Anterior circulation: Both  ICA siphons are patent without significant plaque or stenosis. Both ophthalmic arteries appear to remain patent. Patent carotid  termini. Normal MCA and ACA origins. Anterior communicating artery and bilateral ACA branches are within normal limits. Left MCA M1 segment, bifurcation, and left MCA branches are within normal limits. Right MCA M1 segment, bifurcation, and right MCA branches are within normal limits. Venous sinuses: Patent. Anatomic variants: Dominant right vertebral artery. Delayed phase: No acute cortically based infarct identified, including in the left PCA territory. No abnormal enhancement identified. No midline shift, mass effect, or evidence of intracranial mass lesion. IMPRESSION: 1. Negative for emergent large vessel occlusion, but suggestion of occluded distal left PCA branches. However, there is no CT evidence of left PCA territory ischemia at this time. 2. Moderate to severe atherosclerotic stenosis of the left vertebral artery origin. Otherwise aortic arch and neck atherosclerosis without hemodynamically significant stenosis. 3. Abnormal right upper lobe nodular and spiculated opacities appear to be new since March. Top differential considerations in this setting include metastatic disease and acute respiratory infection. 4. Metastatic nodal disease at the left thoracic inlet and superior mediastinum. Electronically Signed   By: Genevie Ann M.D.   On: 04/04/2016 14:11   Ct Head Wo Contrast  04/04/2016  CLINICAL DATA:  Code stroke EXAM: CT HEAD WITHOUT CONTRAST TECHNIQUE: Contiguous axial images were obtained from the base of the skull through the vertex without intravenous contrast. COMPARISON:  05/08/2013 FINDINGS: No skull fracture is noted. Paranasal sinuses and mastoid air cells are unremarkable. Mild cerebral atrophy. Mild periventricular chronic white matter disease. No definite acute cortical infarction. No mass lesion is noted on this unenhanced scan. Ventricular size is stable from  prior exam. IMPRESSION: No acute intracranial abnormality. Mild cerebral atrophy. Mild periventricular chronic white matter disease. No definite acute cortical infarction. These results were called by telephone at the time of interpretation on 04/04/2016 at 11:13 am to Dr. Delman Kitten , who verbally acknowledged these results. Electronically Signed   By: Lahoma Crocker M.D.   On: 04/04/2016 11:13   Ct Angio Neck W/cm &/or Wo/cm  04/04/2016  CLINICAL DATA:  82 year old male with left eye peripheral vision loss since waking today. Initial encounter. Pancreatic cancer. EXAM: CT ANGIOGRAPHY HEAD AND NECK TECHNIQUE: Multidetector CT imaging of the head and neck was performed using the standard protocol during bolus administration of intravenous contrast. Multiplanar CT image reconstructions and MIPs were obtained to evaluate the vascular anatomy. Carotid stenosis measurements (when applicable) are obtained utilizing NASCET criteria, using the distal internal carotid diameter as the denominator. CONTRAST:  75 mL Isovue 370. COMPARISON:  Head CT without contrast 1103 hours today. PET-CT 02/15/2016 FINDINGS: CTA NECK Skeleton: No acute osseous abnormality identified. Degenerative changes in the cervical spine with moderate to severe degenerative spinal stenosis that C5-C6. Other neck: Left chest porta cath, subclavian approach. Centrilobular and paraseptal emphysema. Apical lung scarring. At the level of the right hilum there is partially visible new peribronchial nodularity in spiculated opacity (series 4, image 11). There is fairly widespread fine nodular opacity in the right upper lobe which seems to be new since March. Prevascular superior mediastinal metastatic disease appears stable to mildly increased (series 4, image 68). Superimposed large heterogeneous thyroid goiter with superior mediastinal extension and dystrophic calcifications. Larynx, pharynx, parapharyngeal spaces, retropharyngeal space, sublingual space,  submandibular glands, and parotid glands are within normal limits. Small metastatic nodes anterior to the left subclavian artery re- demonstrated (series 4, image 91) an may be mildly progressed. Aortic arch: 3 vessel arch configuration. Moderate soft and calcified arch atherosclerosis. No great vessel origin stenosis. Right carotid system: Negative  right CCA. Calcified plaque at the posterior right ICA and bulb. No right ICA origin stenosis. At the level of the distal bulb there is additional soft plaque, but subsequent stenosis is less than 50 % with respect to the distal vessel. See series 9, image 57). Right ICA remains patent to the skullbase. Left carotid system: No left CCA origin stenosis. Negative left CCA. Mild plaque at the left carotid bifurcation without stenosis. Negative cervical left ICA. Vertebral arteries:No proximal right subclavian artery stenosis despite plaque. Normal right vertebral artery origin. Mildly dominant right vertebral artery is normal to the skullbase. No proximal left subclavian artery stenosis despite plaque. Dense calcified plaque at the left vertebral artery origin resulting in severe left vertebral artery origin stenosis. Non dominant left vertebral artery remains patent and is otherwise negative to the skullbase. CTA HEAD Posterior circulation: Patent distal vertebral arteries, the right is dominant. Normal PICA origins. Normal vertebrobasilar junction. No basilar artery stenosis. Normal SCA and PCA origins. Right posterior communicating artery is present, the left is diminutive or absent. The distal left PCA branches appear occluded in the P3 division (series 10, image 23). Right PCA branches are within normal limits. Anterior circulation: Both ICA siphons are patent without significant plaque or stenosis. Both ophthalmic arteries appear to remain patent. Patent carotid termini. Normal MCA and ACA origins. Anterior communicating artery and bilateral ACA branches are within  normal limits. Left MCA M1 segment, bifurcation, and left MCA branches are within normal limits. Right MCA M1 segment, bifurcation, and right MCA branches are within normal limits. Venous sinuses: Patent. Anatomic variants: Dominant right vertebral artery. Delayed phase: No acute cortically based infarct identified, including in the left PCA territory. No abnormal enhancement identified. No midline shift, mass effect, or evidence of intracranial mass lesion. IMPRESSION: 1. Negative for emergent large vessel occlusion, but suggestion of occluded distal left PCA branches. However, there is no CT evidence of left PCA territory ischemia at this time. 2. Moderate to severe atherosclerotic stenosis of the left vertebral artery origin. Otherwise aortic arch and neck atherosclerosis without hemodynamically significant stenosis. 3. Abnormal right upper lobe nodular and spiculated opacities appear to be new since March. Top differential considerations in this setting include metastatic disease and acute respiratory infection. 4. Metastatic nodal disease at the left thoracic inlet and superior mediastinum. Electronically Signed   By: Genevie Ann M.D.   On: 04/04/2016 14:11   Mr Brain Wo Contrast  04/04/2016  CLINICAL DATA:  82 year old hypertensive male with pancreatic cancer presenting with left-sided visual loss. Subsequent encounter. EXAM: MRI HEAD WITHOUT CONTRAST TECHNIQUE: Multiplanar, multiecho pulse sequences of the brain and surrounding structures were obtained without intravenous contrast. COMPARISON:  04/04/2016 CT angiogram and CT head.  2014 brain MR. FINDINGS: Acute nonhemorrhagic infarct medial right occipital lobe extending to posterior medial right temporal lobe. No associated hemorrhage. Slow flow within adjacent vessel. Tiny area blood breakdown products left periventricular region probably related to prior episode hemorrhagic ischemia. Minimal small vessel disease changes. Global atrophy without  hydrocephalus. Major intracranial vascular structures are patent. No intracranial mass lesion noted on this unenhanced exam. No acute orbital abnormality. Cervical medullary junction unremarkable. Mild cervical spondylotic changes C3-4. 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 No associated hemorrhage. Electronically Signed   By: Genia Del M.D.   On: 04/04/2016 17:40    Assessment:  The patient is a 82 y.o. gentleman with stage IV pancreatic cancer diagnosed 03/04/2016.  He is currently  day 24 s/p cycle #1 gemcitabine.  He was diagnosed with a left lower DVT on 03/29/2016 and has been on Xarelto since that time.  He has a history of recurrent TIAs.  He developed left homonymous hemianopsia on 04/04/2016 with head MRI revealing a nonhemorrhagic infarct medial right occipital lobe.  MRA reveals moderate to severe atherosclerosis.  Plan:   1.  Continue Xarelto. 2.  Follow-up echocardiogram.  Per neurology if PFO documented, switch to Coumadin.  Would also consider IVC filter if PFO documented. 3.  Anticipate discharge on 04/06/2016 if continues to do well and echo negative.  Patient scheduled for cycle #2 chemotherapy next week.  Thank you for allowing me to participate in HAYDON DORRIS 's care.  I will follow him closely with you while hospitalized and after discharge in the outpatient department.   Lequita Asal, MD  04/05/2016

## 2016-04-06 NOTE — Progress Notes (Signed)
Pt being discharged home at this time, discharge instructions and prescriptions reviewed with pt and wife, states understanding, pt with no noted complaints at discharge

## 2016-04-09 ENCOUNTER — Telehealth: Payer: Self-pay

## 2016-04-09 NOTE — Telephone Encounter (Signed)
Returned daughters phone call informed her that I had tried to call her father regarding the daughter reported pt had diarreah since 5p yesterday.  She reports he had ensure but continues with loose stools at time not making to the bathroom and doesn't eat much had toast.  I explained that it is near 5 pm today and we cannot add on fluids today as pts daughter would like to know if he can have.  I explained that we can add her father on for MD visit tomorrow if he would like, but without speaking with him I do not know of his wishes.  I left a message on pts VM for him to call me and also told daughter she could call us back soon to know if she spoke with her father again. Called daughter back in 5 minutes to ask if she spoke with her father and she stated yes and gave me the home number to call.  Called and spoke with Pt.  Pt reports he had 2 loose stools since yesterday at 5pm.  Pt reports he has had lots to drink today.  A quart of fluids plus ensure and toast and crackers.  Pt took temp and was 97.8 while on phone.  Pt reports his daughter is probably more worried than him.  I asked pt if he felt the need to go to the ER and he stated no he does not feel that bad.  I asked pt if he would like to see [place] tomorrow in [place].  Pt reported he would like to call us if he feels the need to see [place].  Pt verbalized an understanding that he would call if needed or go to the ER.  No other concerns noted.

## 2016-04-10 ENCOUNTER — Inpatient Hospital Stay
Admission: EM | Admit: 2016-04-10 | Discharge: 2016-04-16 | DRG: 872 | Disposition: A | Discharge status: Hospice | Payer: Medicare Other | Attending: Internal Medicine | Admitting: Internal Medicine

## 2016-04-10 ENCOUNTER — Encounter: Payer: Self-pay | Admitting: Hematology and Oncology

## 2016-04-10 ENCOUNTER — Other Ambulatory Visit: Payer: Self-pay | Admitting: Internal Medicine

## 2016-04-10 ENCOUNTER — Emergency Department: Payer: Medicare Other

## 2016-04-10 ENCOUNTER — Telehealth: Payer: Self-pay | Admitting: Internal Medicine

## 2016-04-10 ENCOUNTER — Encounter: Payer: Self-pay | Admitting: Emergency Medicine

## 2016-04-10 DIAGNOSIS — Z79899 Other long term (current) drug therapy: Secondary | ICD-10-CM

## 2016-04-10 DIAGNOSIS — I82412 Acute embolism and thrombosis of left femoral vein: Secondary | ICD-10-CM | POA: Diagnosis present

## 2016-04-10 DIAGNOSIS — I82422 Acute embolism and thrombosis of left iliac vein: Secondary | ICD-10-CM | POA: Diagnosis present

## 2016-04-10 DIAGNOSIS — A4151 Sepsis due to Escherichia coli [E. coli]: Principal | ICD-10-CM | POA: Diagnosis present

## 2016-04-10 DIAGNOSIS — C259 Malignant neoplasm of pancreas, unspecified: Secondary | ICD-10-CM | POA: Diagnosis present

## 2016-04-10 DIAGNOSIS — X58XXXA Exposure to other specified factors, initial encounter: Secondary | ICD-10-CM | POA: Diagnosis present

## 2016-04-10 DIAGNOSIS — C4490 Unspecified malignant neoplasm of skin, unspecified: Secondary | ICD-10-CM | POA: Diagnosis present

## 2016-04-10 DIAGNOSIS — E871 Hypo-osmolality and hyponatremia: Secondary | ICD-10-CM | POA: Diagnosis present

## 2016-04-10 DIAGNOSIS — D638 Anemia in other chronic diseases classified elsewhere: Secondary | ICD-10-CM | POA: Diagnosis present

## 2016-04-10 DIAGNOSIS — R109 Unspecified abdominal pain: Secondary | ICD-10-CM

## 2016-04-10 DIAGNOSIS — R0602 Shortness of breath: Secondary | ICD-10-CM

## 2016-04-10 DIAGNOSIS — R441 Visual hallucinations: Secondary | ICD-10-CM | POA: Diagnosis not present

## 2016-04-10 DIAGNOSIS — Z87891 Personal history of nicotine dependence: Secondary | ICD-10-CM | POA: Diagnosis not present

## 2016-04-10 DIAGNOSIS — R188 Other ascites: Secondary | ICD-10-CM | POA: Diagnosis present

## 2016-04-10 DIAGNOSIS — K219 Gastro-esophageal reflux disease without esophagitis: Secondary | ICD-10-CM | POA: Diagnosis present

## 2016-04-10 DIAGNOSIS — A419 Sepsis, unspecified organism: Secondary | ICD-10-CM | POA: Diagnosis present

## 2016-04-10 DIAGNOSIS — C7800 Secondary malignant neoplasm of unspecified lung: Secondary | ICD-10-CM | POA: Diagnosis present

## 2016-04-10 DIAGNOSIS — E872 Acidosis: Secondary | ICD-10-CM | POA: Diagnosis present

## 2016-04-10 DIAGNOSIS — N179 Acute kidney failure, unspecified: Secondary | ICD-10-CM | POA: Diagnosis present

## 2016-04-10 DIAGNOSIS — S36039A Unspecified laceration of spleen, initial encounter: Secondary | ICD-10-CM | POA: Diagnosis present

## 2016-04-10 DIAGNOSIS — IMO0002 Reserved for concepts with insufficient information to code with codable children: Secondary | ICD-10-CM | POA: Insufficient documentation

## 2016-04-10 DIAGNOSIS — Z8673 Personal history of transient ischemic attack (TIA), and cerebral infarction without residual deficits: Secondary | ICD-10-CM

## 2016-04-10 DIAGNOSIS — R140 Abdominal distension (gaseous): Secondary | ICD-10-CM

## 2016-04-10 DIAGNOSIS — D649 Anemia, unspecified: Secondary | ICD-10-CM

## 2016-04-10 DIAGNOSIS — C251 Malignant neoplasm of body of pancreas: Secondary | ICD-10-CM | POA: Diagnosis not present

## 2016-04-10 DIAGNOSIS — Z7982 Long term (current) use of aspirin: Secondary | ICD-10-CM | POA: Diagnosis not present

## 2016-04-10 DIAGNOSIS — I10 Essential (primary) hypertension: Secondary | ICD-10-CM | POA: Diagnosis present

## 2016-04-10 DIAGNOSIS — Z7901 Long term (current) use of anticoagulants: Secondary | ICD-10-CM

## 2016-04-10 DIAGNOSIS — R5381 Other malaise: Secondary | ICD-10-CM | POA: Diagnosis present

## 2016-04-10 DIAGNOSIS — C787 Secondary malignant neoplasm of liver and intrahepatic bile duct: Secondary | ICD-10-CM | POA: Diagnosis present

## 2016-04-10 DIAGNOSIS — C250 Malignant neoplasm of head of pancreas: Secondary | ICD-10-CM

## 2016-04-10 DIAGNOSIS — Z9221 Personal history of antineoplastic chemotherapy: Secondary | ICD-10-CM | POA: Diagnosis not present

## 2016-04-10 DIAGNOSIS — E860 Dehydration: Secondary | ICD-10-CM | POA: Diagnosis present

## 2016-04-10 DIAGNOSIS — I82812 Embolism and thrombosis of superficial veins of left lower extremities: Secondary | ICD-10-CM | POA: Diagnosis not present

## 2016-04-10 DIAGNOSIS — Z86718 Personal history of other venous thrombosis and embolism: Secondary | ICD-10-CM | POA: Diagnosis not present

## 2016-04-10 LAB — LACTIC ACID, PLASMA
LACTIC ACID, VENOUS: 3.7 mmol/L — AB (ref 0.5–2.0)
LACTIC ACID, VENOUS: 2.7 mmol/L — AB (ref 0.5–2.0)
LACTIC ACID, VENOUS: 3.1 mmol/L — AB (ref 0.5–2.0)

## 2016-04-10 LAB — HEPATIC FUNCTION PANEL
ALK PHOS: 245 U/L — AB (ref 38–126)
ALT: 30 U/L (ref 17–63)
AST: 29 U/L (ref 15–41)
Albumin: 1.2 g/dL — ABNORMAL LOW (ref 3.5–5.0)
BILIRUBIN INDIRECT: 0.5 mg/dL (ref 0.3–0.9)
Bilirubin, Direct: 0.5 mg/dL (ref 0.1–0.5)
Total Bilirubin: 1 mg/dL (ref 0.3–1.2)

## 2016-04-10 LAB — BASIC METABOLIC PANEL
ANION GAP: 15 (ref 5–15)
BUN: 30 mg/dL — AB (ref 6–20)
CO2: 19 mmol/L — ABNORMAL LOW (ref 22–32)
Calcium: 8.3 mg/dL — ABNORMAL LOW (ref 8.9–10.3)
Chloride: 98 mmol/L — ABNORMAL LOW (ref 101–111)
Creatinine, Ser: 1.28 mg/dL — ABNORMAL HIGH (ref 0.61–1.24)
GFR, EST AFRICAN AMERICAN: 59 mL/min — AB (ref >60)
GFR, EST NON AFRICAN AMERICAN: 51 mL/min — AB (ref >60)
Glucose, Bld: 132 mg/dL — ABNORMAL HIGH (ref 65–99)
POTASSIUM: 3.6 mmol/L (ref 3.5–5.1)
SODIUM: 132 mmol/L — AB (ref 135–145)

## 2016-04-10 LAB — CBC WITH DIFFERENTIAL/PLATELET
BASOS ABS: 0.1 10*3/uL (ref 0–0.1)
EOS ABS: 0 10*3/uL (ref 0–0.7)
HCT: 35.4 % — ABNORMAL LOW (ref 40.0–52.0)
Hemoglobin: 11.4 g/dL — ABNORMAL LOW (ref 13.0–18.0)
Lymphocytes Relative: 2 %
Lymphs Abs: 0.2 10*3/uL — ABNORMAL LOW (ref 1.0–3.6)
MCH: 30.3 pg (ref 26.0–34.0)
MCHC: 32.2 g/dL (ref 32.0–36.0)
MCV: 94.1 fL (ref 80.0–100.0)
Monocytes Absolute: 0.7 10*3/uL (ref 0.2–1.0)
Monocytes Relative: 7 %
Neutro Abs: 9.7 10*3/uL — ABNORMAL HIGH (ref 1.4–6.5)
PLATELETS: 295 10*3/uL (ref 150–440)
RBC: 3.76 MIL/uL — AB (ref 4.40–5.90)
RDW: 16.2 % — ABNORMAL HIGH (ref 11.5–14.5)
WBC: 10.7 10*3/uL — AB (ref 3.8–10.6)

## 2016-04-10 LAB — PROTIME-INR
INR: 2.1
Prothrombin Time: 23.4 seconds — ABNORMAL HIGH (ref 11.4–15.0)

## 2016-04-10 LAB — HEPARIN LEVEL (UNFRACTIONATED): Heparin Unfractionated: 2 IU/mL — ABNORMAL HIGH (ref 0.30–0.70)

## 2016-04-10 LAB — LIPASE, BLOOD: Lipase: 13 U/L (ref 11–51)

## 2016-04-10 LAB — APTT: aPTT: 38 seconds — ABNORMAL HIGH (ref 24–36)

## 2016-04-10 LAB — TROPONIN I

## 2016-04-10 LAB — PROCALCITONIN: Procalcitonin: 63.79 ng/mL

## 2016-04-10 MED ORDER — ONDANSETRON HCL 4 MG/2ML IJ SOLN
INTRAMUSCULAR | Status: AC
  Filled 2016-04-10: qty 2

## 2016-04-10 MED ORDER — ALBUTEROL SULFATE (2.5 MG/3ML) 0.083% IN NEBU: 2.5000 mg | INHALATION_SOLUTION | RESPIRATORY_TRACT | Status: DC | PRN

## 2016-04-10 MED ORDER — MORPHINE SULFATE (PF) 4 MG/ML IV SOLN
4.0000 mg | Freq: Once | INTRAVENOUS | Status: AC
Start: 2016-04-10 — End: 2016-04-10
  Administered 2016-04-10: 4 mg via INTRAVENOUS
  Filled 2016-04-10: qty 1

## 2016-04-10 MED ORDER — DIATRIZOATE MEGLUMINE & SODIUM 66-10 % PO SOLN
15.0000 mL | Freq: Once | ORAL | Status: AC
  Administered 2016-04-10: 15 mL via ORAL

## 2016-04-10 MED ORDER — MORPHINE SULFATE (PF) 4 MG/ML IV SOLN
4.0000 mg | Freq: Once | INTRAVENOUS | Status: AC
  Administered 2016-04-10: 4 mg via INTRAVENOUS

## 2016-04-10 MED ORDER — MORPHINE SULFATE (PF) 4 MG/ML IV SOLN
INTRAVENOUS | Status: AC
  Filled 2016-04-10: qty 1

## 2016-04-10 MED ORDER — ONDANSETRON HCL 4 MG/2ML IJ SOLN
4.0000 mg | Freq: Four times a day (QID) | INTRAMUSCULAR | Status: DC | PRN
  Administered 2016-04-10 – 2016-04-16 (×4): 4 mg via INTRAVENOUS
  Filled 2016-04-10 (×3): qty 2

## 2016-04-10 MED ORDER — PIPERACILLIN-TAZOBACTAM 3.375 G IVPB 30 MIN
3.3750 g | Freq: Once | INTRAVENOUS | Status: AC
  Administered 2016-04-10: 3.375 g via INTRAVENOUS
  Filled 2016-04-10: qty 50

## 2016-04-10 MED ORDER — ATORVASTATIN CALCIUM 20 MG PO TABS
80.0000 mg | ORAL_TABLET | Freq: Every day | ORAL | Status: DC
  Administered 2016-04-11: 80 mg via ORAL
  Filled 2016-04-10 (×3): qty 4

## 2016-04-10 MED ORDER — ONDANSETRON HCL 4 MG/2ML IJ SOLN
INTRAMUSCULAR | Status: AC
  Administered 2016-04-10: 4 mg via INTRAVENOUS
  Filled 2016-04-10: qty 2

## 2016-04-10 MED ORDER — SODIUM CHLORIDE 0.9 % IV SOLN
INTRAVENOUS | Status: DC
  Administered 2016-04-10 – 2016-04-11 (×2): via INTRAVENOUS

## 2016-04-10 MED ORDER — MEGESTROL ACETATE 400 MG/10ML PO SUSP
200.0000 mg | Freq: Every day | ORAL | Status: DC
  Administered 2016-04-10 – 2016-04-16 (×7): 200 mg via ORAL
  Filled 2016-04-10 (×7): qty 5

## 2016-04-10 MED ORDER — IOPAMIDOL (ISOVUE-300) INJECTION 61%
100.0000 mL | Freq: Once | INTRAVENOUS | Status: AC | PRN
  Administered 2016-04-10: 100 mL via INTRAVENOUS

## 2016-04-10 MED ORDER — PIPERACILLIN-TAZOBACTAM 3.375 G IVPB
3.3750 g | Freq: Three times a day (TID) | INTRAVENOUS | Status: DC
  Administered 2016-04-10 – 2016-04-13 (×8): 3.375 g via INTRAVENOUS
  Filled 2016-04-10 (×10): qty 50

## 2016-04-10 MED ORDER — ONDANSETRON HCL 4 MG PO TABS: 4.0000 mg | ORAL_TABLET | Freq: Four times a day (QID) | ORAL | Status: DC | PRN

## 2016-04-10 MED ORDER — SODIUM CHLORIDE 0.9 % IV BOLUS (SEPSIS): 1000.0000 mL | Freq: Once | INTRAVENOUS | Status: DC

## 2016-04-10 MED ORDER — MORPHINE SULFATE (PF) 4 MG/ML IV SOLN
4.0000 mg | INTRAVENOUS | Status: DC | PRN
  Administered 2016-04-10 – 2016-04-15 (×11): 4 mg via INTRAVENOUS
  Filled 2016-04-10 (×12): qty 1

## 2016-04-10 MED ORDER — ZOLPIDEM TARTRATE 5 MG PO TABS
5.0000 mg | ORAL_TABLET | Freq: Every evening | ORAL | Status: DC | PRN
  Administered 2016-04-10 – 2016-04-15 (×4): 5 mg via ORAL
  Filled 2016-04-10 (×4): qty 1

## 2016-04-10 MED ORDER — ONDANSETRON HCL 4 MG/2ML IJ SOLN
4.0000 mg | Freq: Once | INTRAMUSCULAR | Status: AC
  Administered 2016-04-10: 4 mg via INTRAVENOUS

## 2016-04-10 MED ORDER — VANCOMYCIN HCL IN DEXTROSE 1-5 GM/200ML-% IV SOLN
1000.0000 mg | INTRAVENOUS | Status: DC
  Administered 2016-04-11: 05:00:00 1000 mg via INTRAVENOUS
  Filled 2016-04-10 (×2): qty 200

## 2016-04-10 MED ORDER — SODIUM CHLORIDE 0.9 % IV BOLUS (SEPSIS)
1000.0000 mL | Freq: Once | INTRAVENOUS | Status: AC
  Administered 2016-04-10: 1000 mL via INTRAVENOUS

## 2016-04-10 MED ORDER — ENSURE ENLIVE PO LIQD
237.0000 mL | Freq: Two times a day (BID) | ORAL | Status: DC
  Administered 2016-04-11 – 2016-04-12 (×2): 237 mL via ORAL

## 2016-04-10 MED ORDER — ACETAMINOPHEN 325 MG PO TABS: 650.0000 mg | ORAL_TABLET | Freq: Four times a day (QID) | ORAL | Status: DC | PRN

## 2016-04-10 MED ORDER — DOCUSATE SODIUM 100 MG PO CAPS: 100.0000 mg | ORAL_CAPSULE | Freq: Two times a day (BID) | ORAL | Status: DC | PRN

## 2016-04-10 MED ORDER — SIMETHICONE 80 MG PO CHEW: 80.0000 mg | CHEWABLE_TABLET | Freq: Four times a day (QID) | ORAL | Status: DC | PRN

## 2016-04-10 MED ORDER — METHIMAZOLE 5 MG PO TABS
5.0000 mg | ORAL_TABLET | Freq: Every day | ORAL | Status: DC
  Administered 2016-04-10 – 2016-04-16 (×7): 5 mg via ORAL
  Filled 2016-04-10 (×7): qty 1

## 2016-04-10 MED ORDER — SODIUM CHLORIDE 0.9 % IV BOLUS (SEPSIS): 250.0000 mL | Freq: Once | INTRAVENOUS | Status: DC

## 2016-04-10 MED ORDER — OXYCODONE HCL 5 MG PO TABS
5.0000 mg | ORAL_TABLET | ORAL | Status: DC | PRN
  Administered 2016-04-11 – 2016-04-14 (×3): 5 mg via ORAL
  Filled 2016-04-10 (×3): qty 1

## 2016-04-10 MED ORDER — ACETAMINOPHEN 650 MG RE SUPP: 650.0000 mg | Freq: Four times a day (QID) | RECTAL | Status: DC | PRN

## 2016-04-10 MED ORDER — ONDANSETRON HCL 4 MG/2ML IJ SOLN
4.0000 mg | Freq: Once | INTRAMUSCULAR | Status: AC
Start: 2016-04-10 — End: 2016-04-10
  Administered 2016-04-10: 4 mg via INTRAVENOUS
  Filled 2016-04-10: qty 2

## 2016-04-10 MED ORDER — VANCOMYCIN HCL IN DEXTROSE 1-5 GM/200ML-% IV SOLN
1000.0000 mg | Freq: Once | INTRAVENOUS | Status: AC
  Administered 2016-04-10: 1000 mg via INTRAVENOUS
  Filled 2016-04-10: qty 200

## 2016-04-10 MED ORDER — HEPARIN (PORCINE) IN NACL 100-0.45 UNIT/ML-% IJ SOLN
1400.0000 [IU]/h | INTRAMUSCULAR | Status: DC
  Administered 2016-04-10 – 2016-04-11 (×2): 1150 [IU]/h via INTRAVENOUS
  Filled 2016-04-10 (×6): qty 250

## 2016-04-10 MED ORDER — MAGNESIUM HYDROXIDE 400 MG/5ML PO SUSP
30.0000 mL | Freq: Every day | ORAL | Status: DC | PRN
  Administered 2016-04-13: 21:00:00 30 mL via ORAL
  Filled 2016-04-10: qty 30

## 2016-04-10 MED ORDER — OXYCODONE HCL 5 MG PO TABS
5.0000 mg | ORAL_TABLET | ORAL | Status: DC | PRN
  Administered 2016-04-10: 22:00:00 5 mg via ORAL
  Filled 2016-04-10: qty 1

## 2016-04-10 NOTE — Progress Notes (Addendum)
ANTICOAGULATION CONSULT NOTE - Initial Consult  Pharmacy Consult for Heparin Indication: DVT  Allergies  Allergen Reactions  . Lodine [Etodolac] Nausea And Vomiting    Patient Measurements: Height: 5\' 11"  (180.3 cm) Weight: 157 lb (71.215 kg) IBW/kg (Calculated) : 75.3 Heparin Dosing Weight: 71.2 kg  Vital Signs: Temp: 98.1 F (36.7 C) (05/03 1357) BP: 112/81 mmHg (05/03 1830) Pulse Rate: 112 (05/03 1830)  Labs:  Recent Labs  04/10/16 1356 04/10/16 1613  HGB 11.4*  --   HCT 35.4*  --   PLT 295  --   CREATININE 1.28*  --   TROPONINI  --  <0.03    Estimated Creatinine Clearance: 46.4 mL/min (by C-G formula based on Cr of 1.28).   Medical History: Past Medical History  Diagnosis Date  . TIA (transient ischemic attack)   . Hypertension   . Diverticulitis   . Cancer ([place])     pancreatic ca  . GERD (gastroesophageal reflux disease)   . History of hiatal hernia   . History of colonic polyps       Assessment: 82 yo male on Xarelto 15 mg PO BID PTA for recent left leg DVT now with a new probable DVT. Spoke with pt who seems uncertain but states that he does not think he took any Xarelto today, last dose last night, possibly around 8 pm.   hgb 11.4, plt 295 INR 2.1, aPTT 38, HL 2.00 Per ED RN, no s/sx of bleeding noted  Goal of Therapy:  Heparin level 0.3-0.7 units/ml Monitor platelets by anticoagulation protocol: Yes  aPTT goal 68-109 until following by heparin levels.   Plan:  No heparin bolus Will start heparin at 1150 units/hr - will start now since it is about 24h since last dose. Will follow and adjust by aPTT for now since anti-Xa elevated due to Xarelto. APTT ordered for 6 hours after start of heparin infusion. CBC in AM  5/4 AM aPTT 84. Continue current regimen. Recheck aPTT, heparin level and CBC tomorrow AM.  Rayna Sexton L 04/10/2016,7:48 PM

## 2016-04-10 NOTE — ED Notes (Signed)
Called code sepsis to [place] at [facility]

## 2016-04-10 NOTE — Telephone Encounter (Signed)
Received a call from answering service regarding this patient- deferred to patient's primary oncologist Dr.Corcoran. Asked to page Dr.Corcoran.  Cody Andrade, can you please call pt- and find out what needs to be done- Thx

## 2016-04-10 NOTE — Telephone Encounter (Signed)
I attempted to call pt, no answer, so I called Cody Andrade who informed me that he just left home in an ambulance for the ER. He has had abd pain

## 2016-04-10 NOTE — ED Provider Notes (Signed)
[facility] Emergency Department Provider Note  ____________________________________________  Time seen: ~1405  I have reviewed the triage vital signs and the nursing notes.   HISTORY  Chief Complaint Abdominal Pain   History limited by: Not Limited   HPI Cody Andrade is a 82 y.o. male who presents to the emergency department today via EMS because of concerns for abdominal pain. He states it started roughly 12 hours ago. It has been constant since then. He describes it is hurting all over. It is cramping. He states this got a little bit better since it started. It was severe. He has had some nausea with some associated vomiting. He denies any recent fevers. Denies similar pain in the past. States he does have a history of an umbilical hernia repair. He also has a history of pancreatic cancer.He has history of blood clots and is on Xarelto and aspirin. He states he did have some diarrhea yesterday although has not had any bowel movement today.   Past Medical History  Diagnosis Date  . TIA (transient ischemic attack)   . Hypertension   . Diverticulitis   . Cancer ([place])     pancreatic ca  . GERD (gastroesophageal reflux disease)   . History of hiatal hernia   . History of colonic polyps     Patient Active Problem List   Diagnosis Date Noted  . CVA (cerebral infarction) 04/04/2016  . Left femoral vein DVT ([place]) 03/29/2016  . Neoplastic malignant related fatigue 03/14/2016  . Hyperthyroidism 03/08/2016  . Neoplasm related pain 03/07/2016  . Insomnia 03/07/2016  . Malignant neoplasm of head of pancreas ([place]) 02/23/2016  . Thrombocytopenia ([place]) 02/19/2016  . Pancreatic mass 02/15/2016  . Pancreatic cancer ([place]) 02/15/2016  . Protein-calorie malnutrition, severe 02/09/2016  . Neoplasm of digestive system   . Acquired hyperbilirubinemia   . Obstruction of bile duct   . Obstructive jaundice 02/08/2016  . Liver lesion 02/08/2016    Past Surgical  History  Procedure Laterality Date  . Hernia repair    . Ercp N/A 02/09/2016    Procedure: ENDOSCOPIC RETROGRADE CHOLANGIOPANCREATOGRAPHY (ERCP);  Surgeon: Lucilla Lame, MD;  Location: [facility] ENDOSCOPY;  Service: Endoscopy;  Laterality: N/A;  . Skin cancer excision      x 3  . Colonoscopy    . Esophagogastroduodenoscopy    . Portacath placement Left 02/27/2016    Procedure: INSERTION PORT-A-CATH;  Surgeon: Christene Lye, MD;  Location: [facility] ORS;  Service: General;  Laterality: Left;    Current Outpatient Rx  Name  Route  Sig  Dispense  Refill  . aspirin 325 MG tablet   Oral   Take 1 tablet (325 mg total) by mouth daily.   30 tablet   0   . atorvastatin (LIPITOR) 80 MG tablet   Oral   Take 1 tablet (80 mg total) by mouth daily at 6 PM.   30 tablet   0   . feeding supplement, ENSURE ENLIVE, (ENSURE ENLIVE) LIQD   Oral   Take 237 mLs by mouth 2 (two) times daily between meals.   237 mL   12   . lidocaine-prilocaine (EMLA) cream   Topical   Apply 1 application topically as needed. Apply 1/2 tablespoon 1-2 hours prior port being used and cover   30 g   0   . magnesium hydroxide (MILK OF MAGNESIA) 400 MG/5ML suspension   Oral   Take 30 mLs by mouth daily as needed for mild constipation.         Marland Kitchen  megestrol (MEGACE) 400 MG/10ML suspension   Oral   Take 5 mLs (200 mg total) by mouth daily.   150 mL   1   . methimazole (TAPAZOLE) 10 MG tablet   Oral   Take 5 mg by mouth daily. Reported on 03/29/2016         . ondansetron (ZOFRAN-ODT) 4 MG disintegrating tablet   Oral   Take 1 tablet (4 mg total) by mouth every 6 (six) hours as needed for nausea. Reported on 02/14/2016   30 tablet   2   . oxyCODONE (OXY IR/ROXICODONE) 5 MG immediate release tablet   Oral   Take 1 tablet (5 mg total) by mouth every 4 (four) hours as needed for moderate pain.   40 tablet   0   . Rivaroxaban (XARELTO) 15 MG TABS tablet   Oral   Take 15 mg by mouth 2 (two) times daily with a  meal.         . simethicone (MYLICON) 80 MG chewable tablet   Oral   Chew 80 mg by mouth every 6 (six) hours as needed for flatulence.         Marland Kitchen zolpidem (AMBIEN) 5 MG tablet   Oral   Take 1 tablet (5 mg total) by mouth at bedtime as needed for sleep.   30 tablet   0     Allergies Lodine  Family History  Problem Relation Age of Onset  . Cancer Father 6    lung  . Breast cancer Maternal Aunt   . Brain cancer Maternal Aunt     Social History Social History  Substance Use Topics  . Smoking status: Former Smoker -- 25 years    Quit date: 12/09/1974  . Smokeless tobacco: Never Used  . Alcohol Use: 0.0 oz/week    0 Standard drinks or equivalent per week    Review of Systems  Constitutional: Negative for fever. Cardiovascular: Negative for chest pain. Respiratory: Negative for shortness of breath. Gastrointestinal: Positive for abdominal pain, vomiting. Neurological: Negative for headaches, focal weakness or numbness.  10-point ROS otherwise negative.  ____________________________________________   PHYSICAL EXAM:  VITAL SIGNS: ED Triage Vitals  Enc Vitals Group     BP 04/10/16 1357 128/87 mmHg     Pulse Rate 04/10/16 1357 133     Resp 04/10/16 1357 26     Temp 04/10/16 1357 98.1 F (36.7 C)     Temp src --      SpO2 04/10/16 1357 100 %     Weight 04/10/16 1357 157 lb (71.215 kg)     Height 04/10/16 1357 5\' 11"  (1.803 m)     Head Cir --      Peak Flow --      Pain Score 04/10/16 1359 4   Constitutional: Alert and oriented. Well appearing and in no distress. Eyes: Conjunctivae are normal. PERRL. Normal extraocular movements. ENT   Head: Normocephalic and atraumatic.   Nose: No congestion/rhinnorhea.   Mouth/Throat: Mucous membranes are moist.   Neck: No stridor. Hematological/Lymphatic/Immunilogical: No cervical lymphadenopathy. Cardiovascular: Normal rate, regular rhythm.  No murmurs, rubs, or gallops. Respiratory: Normal respiratory  effort without tachypnea nor retractions. Breath sounds are clear and equal bilaterally. No wheezes/rales/rhonchi. Gastrointestinal: Soft. Distended. Diffusely moderately tender. Tympanitic. No pain with heel strike. Genitourinary: Deferred Musculoskeletal: Normal range of motion in all extremities. No joint effusions.  No lower extremity tenderness nor edema. Neurologic:  Normal speech and language. No gross focal neurologic deficits are appreciated.  Skin:  Skin is warm, dry and intact. No rash noted. Psychiatric: Mood and affect are normal. Speech and behavior are normal. Patient exhibits appropriate insight and judgment.  ____________________________________________    LABS (pertinent positives/negatives)  Labs Reviewed  CBC WITH DIFFERENTIAL/PLATELET - Abnormal; Notable for the following:    WBC 10.7 (*)    RBC 3.76 (*)    Hemoglobin 11.4 (*)    HCT 35.4 (*)    RDW 16.2 (*)    Neutro Abs 9.7 (*)    Lymphs Abs 0.2 (*)    All other components within normal limits  BASIC METABOLIC PANEL - Abnormal; Notable for the following:    Sodium 132 (*)    Chloride 98 (*)    CO2 19 (*)    Glucose, Bld 132 (*)    BUN 30 (*)    Creatinine, Ser 1.28 (*)    Calcium 8.3 (*)    GFR calc non Af Amer 51 (*)    GFR calc Af Amer 59 (*)    All other components within normal limits  LACTIC ACID, PLASMA - Abnormal; Notable for the following:    Lactic Acid, Venous 3.7 (*)    All other components within normal limits  LACTIC ACID, PLASMA - Abnormal; Notable for the following:    Lactic Acid, Venous 2.7 (*)    All other components within normal limits  HEPATIC FUNCTION PANEL - Abnormal; Notable for the following:    Total Protein <3.0 (*)    Albumin 1.2 (*)    Alkaline Phosphatase 245 (*)    All other components within normal limits  CULTURE, BLOOD (ROUTINE X 2)  CULTURE, BLOOD (ROUTINE X 2)  URINE CULTURE  TROPONIN I  LIPASE, BLOOD  URINALYSIS COMPLETEWITH MICROSCOPIC ([facility] ONLY)  LACTIC  ACID, PLASMA  LACTIC ACID, PLASMA     ____________________________________________   EKG  I, Nance Pear, attending physician, personally viewed and interpreted this EKG  EKG Time: 1355 Rate: 134 Rhythm: sinus tachycardia Axis: left axis deviation Intervals: qtc 420 QRS: narrow ST changes: no st elevation Impression: abnormal ekg   ____________________________________________    RADIOLOGY  CXR IMPRESSION: Nodular airspace disease in the RIGHT lung not changed from prior.  CT abd/pel IMPRESSION: 1. New small bilateral pleural effusions and subtle pulmonary edema. 2. Progressive metastatic disease in the liver. 3. Progressive enlargement of the pancreatic cancer. 4. New splenic lacerations without a subcapsular hematoma or visible acute hemorrhage. 5. New probable venous thrombosis of the left external iliac vein and left common femoral vein. 6. Aortic atherosclerosis. 7. New extensive ascites since 02/14/2016, progressed since 03/28/2016  I, Yacine Droz, personally discussed these images (CT scan) and results by phone with the on-call radiologist and used this discussion as part of my medical decision making.   ____________________________________________   PROCEDURES  Procedure(s) performed: None  Critical Care performed: Yes, see critical care note(s)   CRITICAL CARE Performed by: Nance Pear   Total critical care time: 40 minutes  Critical care time was exclusive of separately billable procedures and treating other patients.  Critical care was necessary to treat or prevent imminent or life-threatening deterioration.  Critical care was time spent personally by me on the following activities: development of treatment plan with patient and/or surrogate as well as nursing, discussions with consultants, evaluation of patient's response to treatment, examination of patient, obtaining history from patient or surrogate, ordering and performing  treatments and interventions, ordering and review of laboratory studies, ordering and review of radiographic  studies, pulse oximetry and re-evaluation of patient's condition.   ____________________________________________   INITIAL IMPRESSION / ASSESSMENT AND PLAN / ED COURSE  Pertinent labs & imaging results that were available during my care of the patient were reviewed by me and considered in my medical decision making (see chart for details).  She presents to the emergency department today because of concerns for abdominal pain for roughly 12 hours. On exam abdomen is moderately diffusely tender and tympanitic. Mild distention. Does have a history of both pancreatic cancer and focal hernia repair. Small bowel obstruction would be high on my differential. Patient had a CT of his abdomen done 2 months ago which did not show any abnormal aortic dilatation. I think AAA less likely. Patient is on anticoagulation at this point and thus I think mesenteric ischemia also less likely. And on getting blood work including lactic acid and CT abdomen and pelvis.  ----------------------------------------- 3:18 PM on 04/10/2016 -----------------------------------------  Lactic acid was elevated. Will get blood cultures, right for Zosyn given likely intra-abdominal infection. Patient was unfortunately unable to keep down much of the contrast. Will send to CT.  ----------------------------------------- 4:26 PM on 04/10/2016 -----------------------------------------  CT abdomen and pelvis with many findings. Unclear however if any of these represent the cause of the patient's pain. I did discuss the CT with the radiologist read the films. Specifically I also asked him to reevaluate for any signs of mesenteric ischemia. He felt that the arteries were patent and without any acute thrombus.  ----------------------------------------- 6:21 PM on 04/10/2016 -----------------------------------------  Repeat  lactic acid has improved. Will plan on admission to the hospital service for further septic workup.  ____________________________________________   FINAL CLINICAL IMPRESSION(S) / ED DIAGNOSES  Final diagnoses:  Sepsis, due to unspecified organism [facility])  Abdominal pain, unspecified abdominal location     Nance Pear, MD 04/10/16 1821

## 2016-04-10 NOTE — Telephone Encounter (Signed)
  Please follow-up with the patient today.  M

## 2016-04-10 NOTE — H&P (Signed)
[facility] [place] NAME: Cody Andrade    MR#:  [id number]  DATE OF BIRTH:  01-12-1933  DATE OF ADMISSION:  04/10/2016  PRIMARY CARE PHYSICIAN: No PCP Per Patient   REQUESTING/REFERRING PHYSICIAN: Nance Pear, MD  CHIEF COMPLAINT:   Chief Complaint  Patient presents with  . Abdominal Pain    Worsening Chronic abdominal pain. HISTORY OF PRESENT ILLNESS:  Cody Andrade  is a 82 y.o. male with a known history of left leg DVT, CVA, pancreatitis cancer with metastasis, hypertension, diverticulitis and GERD. The patient presents to the ED with worsening abdominal pain and abdominal distention. Abdominal pain is constant, 2-10 out of 10 without radiation. Patient complains of nausea and vomiting with dark brown and coffee-ground vomitus. Patient has chills, poor oral intake and generalized weakness. He denies any hematuria, dysuria, melena or bloody stool. He was found tachycardia at 130, tachypnea at 26. He is being treated with Zosyn and vancomycin for sepsis in the ED. Chest x-ray didn't show any pneumonia. Urinalysis is pending.  PAST MEDICAL HISTORY:   Past Medical History  Diagnosis Date  . TIA (transient ischemic attack)   . Hypertension   . Diverticulitis   . Cancer ([place])     pancreatic ca  . GERD (gastroesophageal reflux disease)   . History of hiatal hernia   . History of colonic polyps     PAST SURGICAL HISTORY:   Past Surgical History  Procedure Laterality Date  . Hernia repair    . Ercp N/A 02/09/2016    Procedure: ENDOSCOPIC RETROGRADE CHOLANGIOPANCREATOGRAPHY (ERCP);  Surgeon: Lucilla Lame, MD;  Location: [facility] ENDOSCOPY;  Service: Endoscopy;  Laterality: N/A;  . Skin cancer excision      x 3  . Colonoscopy    . Esophagogastroduodenoscopy    . Portacath placement Left 02/27/2016    Procedure: INSERTION PORT-A-CATH;  Surgeon: Christene Lye, MD;  Location: [facility] ORS;  Service: General;  Laterality: Left;     SOCIAL HISTORY:   Social History  Substance Use Topics  . Smoking status: Former Smoker -- 25 years    Quit date: 12/09/1974  . Smokeless tobacco: Never Used  . Alcohol Use: 0.0 oz/week    0 Standard drinks or equivalent per week    FAMILY HISTORY:   Family History  Problem Relation Age of Onset  . Cancer Father 82    lung  . Breast cancer Maternal Aunt   . Brain cancer Maternal Aunt     DRUG ALLERGIES:   Allergies  Allergen Reactions  . Lodine [Etodolac] Nausea And Vomiting    REVIEW OF SYSTEMS:  CONSTITUTIONAL: No fever, but has chills, poor oral intake and generalized weakness.  EYES: No blurred or double vision.  EARS, NOSE, AND THROAT: No tinnitus or ear pain.  RESPIRATORY: No cough, shortness of breath, wheezing or hemoptysis.  CARDIOVASCULAR: No chest pain, orthopnea, edema.  GASTROINTESTINAL: Has nausea, vomiting, abdominal pain. No diarrhea. GENITOURINARY: No dysuria, hematuria.  ENDOCRINE: No polyuria, nocturia,  HEMATOLOGY: No anemia, easy bruising or bleeding SKIN: No rash or lesion. MUSCULOSKELETAL: No joint pain or arthritis.   NEUROLOGIC: No tingling, numbness, weakness.  PSYCHIATRY: No anxiety or depression.   MEDICATIONS AT HOME:   Prior to Admission medications   Medication Sig Start Date End Date Taking? Authorizing Provider  aspirin 325 MG tablet Take 1 tablet (325 mg total) by mouth daily. 04/06/16  Yes Vaughan Basta, MD  atorvastatin (LIPITOR) 80 MG tablet Take  1 tablet (80 mg total) by mouth daily at 6 PM. 04/06/16  Yes Vaughan Basta, MD  docusate sodium (COLACE) 100 MG capsule Take 100 mg by mouth 2 (two) times daily as needed for mild constipation.   Yes Historical Provider, MD  feeding supplement, ENSURE ENLIVE, (ENSURE ENLIVE) LIQD Take 237 mLs by mouth 2 (two) times daily between meals. 04/06/16  Yes Vaughan Basta, MD  lidocaine-prilocaine (EMLA) cream Apply 1 application topically as needed (prior to accessing  port).   Yes Historical Provider, MD  magnesium hydroxide (MILK OF MAGNESIA) 400 MG/5ML suspension Take 30 mLs by mouth daily as needed for mild constipation.   Yes Historical Provider, MD  megestrol (MEGACE) 400 MG/10ML suspension Take 5 mLs (200 mg total) by mouth daily. 03/07/16  Yes Lequita Asal, MD  methimazole (TAPAZOLE) 10 MG tablet Take 5 mg by mouth daily.    Yes Historical Provider, MD  ondansetron (ZOFRAN-ODT) 4 MG disintegrating tablet Take 1 tablet (4 mg total) by mouth every 6 (six) hours as needed for nausea. Reported on 02/14/2016 03/07/16  Yes Lequita Asal, MD  oxyCODONE (OXY IR/ROXICODONE) 5 MG immediate release tablet Take 1 tablet (5 mg total) by mouth every 4 (four) hours as needed for moderate pain. 03/07/16  Yes Lequita Asal, MD  Rivaroxaban (XARELTO) 15 MG TABS tablet Take 15 mg by mouth 2 (two) times daily with a meal.   Yes Historical Provider, MD  simethicone (MYLICON) 80 MG chewable tablet Chew 80 mg by mouth every 6 (six) hours as needed for flatulence.   Yes Historical Provider, MD  zolpidem (AMBIEN) 5 MG tablet Take 1 tablet (5 mg total) by mouth at bedtime as needed for sleep. 03/07/16  Yes Lequita Asal, MD      VITAL SIGNS:  Blood pressure 124/69, pulse 113, temperature 98.1 F (36.7 C), resp. rate 24, height 5\' 11"  (1.803 m), weight 71.215 kg (157 lb), SpO2 96 %.  PHYSICAL EXAMINATION:  GENERAL:  82 y.o.-year-old patient lying in the bed with no acute distress.  EYES: Pupils equal, round, reactive to light and accommodation. No scleral icterus. Extraocular muscles intact.  HEENT: Head atraumatic, normocephalic. Oropharynx and nasopharynx clear.  NECK:  Supple, no jugular venous distention. No thyroid enlargement, no tenderness.  LUNGS: Normal breath sounds bilaterally, no wheezing, rales,rhonchi or crepitation. No use of accessory muscles of respiration.  CARDIOVASCULAR: S1, S2 normal. No murmurs, rubs, or gallops.  ABDOMEN: Soft, diffuse  tenderness and distention. Bowel sounds present. Unable to estimate organomegaly or mass.  EXTREMITIES: Left leg swelling, no cyanosis, or clubbing.  NEUROLOGIC: Cranial nerves II through XII are intact. Muscle strength 5/5 in all extremities. Sensation intact. Gait not checked.  PSYCHIATRIC: The patient is alert and oriented x 3.  SKIN: No obvious rash, lesion, or ulcer.   LABORATORY PANEL:   CBC  Recent Labs Lab 04/10/16 1356  WBC 10.7*  HGB 11.4*  HCT 35.4*  PLT 295   ------------------------------------------------------------------------------------------------------------------  Chemistries   Recent Labs Lab 04/10/16 1356 04/10/16 1613  NA 132*  --   K 3.6  --   CL 98*  --   CO2 19*  --   GLUCOSE 132*  --   BUN 30*  --   CREATININE 1.28*  --   CALCIUM 8.3*  --   AST  --  29  ALT  --  30  ALKPHOS  --  245*  BILITOT  --  1.0   ------------------------------------------------------------------------------------------------------------------  Cardiac Enzymes  Recent Labs Lab 04/10/16 1613  TROPONINI <0.03   ------------------------------------------------------------------------------------------------------------------  RADIOLOGY:  Ct Abdomen Pelvis W Contrast  04/10/2016  CLINICAL DATA:  Periumbilical pain which developed early this morning with nausea and vomiting. Metastatic pancreatic cancer. EXAM: CT ABDOMEN AND PELVIS WITH CONTRAST TECHNIQUE: Multidetector CT imaging of the abdomen and pelvis was performed using the standard protocol following bolus administration of intravenous contrast. CONTRAST:  168mL ISOVUE-300 IOPAMIDOL (ISOVUE-300) INJECTION 61% COMPARISON:  Abdominal CT dated 02/14/2016 and chest CT dated 03/28/2016 and PET-CT scan dated 02/15/2016 FINDINGS: Lower chest: New small bilateral pleural effusions. Stable 5 mm nodule in the right middle lobe peripherally. Subtle new peripheral edema at the lung bases. The lungs are hyperinflated  consistent emphysema. Hepatobiliary: Numerous metastatic lesions in the liver have progressed. There is new periportal edema. Biliary stent in place with air in the biliary tree. Pancreas: Pancreatic mass measures 3.3 x 2.5 cm, increased from 3.2 x 2.3 cm. Increased atrophy of the pancreatic tail with increased dilatation of the distal pancreatic duct. Spleen: There are 2 new linear areas of abnormal low density in the periphery of the superior aspect of the spleen consistent with splenic lacerations. The configuration is not suggestive of metastatic disease. There is no subcapsular hemorrhage. Adrenals/Urinary Tract: The adrenal glands and right kidney are normal. Benign 14 mm cyst in the upper pole of the left kidney and benign 23 mm cyst in the lower pole of the left kidney. No hydronephrosis. Bladder appears normal. Stomach/Bowel: Extensive diverticulosis of the left side of the colon. Diffuse edema of the colonic mucosa. Small bowel appears normal. Peripheral lacerations in the upper pole of the spleen without subcapsular hematoma. Vascular/Lymphatic: The left external iliac vein is enlarged as is the left common femoral vein and there appear to be thrombi in these vessels. This is not not definitive since mixing of unenhanced blood with enhance blood could give this appearance although the distention of the vein would be atypical and is new since the prior study. Aortic atherosclerosis. Reproductive: Normal. Other: Extensive new ascites since the CT scan of 02/14/2016. Increased ascites since the CT scan of 03/28/2016. Musculoskeletal: Multilevel degenerative facet arthritis in the lumbar spine. No acute abnormality. No visible bone metastases. IMPRESSION: 1. New small bilateral pleural effusions and subtle pulmonary edema. 2. Progressive metastatic disease in the liver. 3. Progressive enlargement of the pancreatic cancer. 4. New splenic lacerations without a subcapsular hematoma or visible acute hemorrhage.  5. New probable venous thrombosis of the left external iliac vein and left common femoral vein. 6. Aortic atherosclerosis. 7. New extensive ascites since 02/14/2016, progressed since 03/28/2016 Electronically Signed   By: Lorriane Shire M.D.   On: 04/10/2016 16:01   Dg Chest Port 1 View  04/10/2016  CLINICAL DATA:  LEFT lower quadrant pain EXAM: PORTABLE CHEST 1 VIEW COMPARISON:  CT chest 03/28/2016 FINDINGS: Normal cardiac silhouette. Mild nodular airspace disease in the RIGHT lung again demonstrated. Exam is lordotic. Low lung volumes. No pulmonary edema or pneumothorax IMPRESSION: Nodular airspace disease in the RIGHT lung not changed from prior. No change from CT of 03/28/2016 Electronically Signed   By: Suzy Bouchard M.D.   On: 04/10/2016 16:26    EKG:   Orders placed or performed during the hospital encounter of 04/04/16  . ED EKG  . ED EKG    IMPRESSION AND PLAN:   Sepsis, unclear source for now. Will be admitted to medical floor. Continue vancomycin and Zosyn pharmacy to dose. Follow-up CBC, urinalysis, urine culture  and blood culture.  Lactic acidosis. Continue IV fluid support. Follow-up lactic acid level. Acute renal failure with dehydration. Continue IV fluid support and follow-up BMP. Hyponatremia. Start normal saline IV and follow-up BMP.  New probable venous thrombosis of the left external iliac vein and left common femoral vein. Recent Left leg DVT. Hold xarelto, start heparin drip. Hold heparin drip if active bleeding.  Anemia of chronic disease. Stable.  New splenic lacerations without a subcapsular hematoma or visible acute hemorrhage. Closely monitor.  Pancreatic cancer with liver metastasis. Oncology consult. Possible malignant ascites. Follow-up oncologist. Worsening chronic abdominal pain. Pain control.   All the records are reviewed and case discussed with ED provider. Management plans discussed with the patient, his wife and they are in agreement.  CODE  STATUS: Full code  TOTAL TIME TAKING CARE OF THIS PATIENT: 63 minutes.    Demetrios Loll M.D on 04/10/2016 at 6:21 PM  Between 7am to 6pm - Pager - [phone]  After 6pm go to [email or web address] - password [email or web address] [facility]  [facility]  Office  [phone]  CC: Primary care physician; No PCP Per Patient

## 2016-04-10 NOTE — ED Notes (Signed)
Pt arrived by EMS with c/o of LLQ pain that started today. Pt has Pancreatic, Lymph and Liver Cancer and currently taking chemo. Pt told EMS that he had loose BM on Monday and Tues and vomited "coffee ground colored emesis today".

## 2016-04-10 NOTE — Progress Notes (Signed)
Pharmacy Antibiotic Note  Cody Andrade is a 82 y.o. male admitted on 04/10/2016 with sepsis.  Pharmacy has been consulted for vancomycin and Zosyn dosing.  Plan: Pt received vancomycin 1 g IV x1 and Zosyn 3.375 g IV x1 in the ED.  Will order vancomycin 1000 mg IV q 18 h to start 8h after initial dose for stacked dose. Trough before 4th dose - 5/6 at 0930. Zosyn 3.375 g IV q8h EI.  Will need to continue to follow renal function and culture results.   Ke 0.043, half life 16.12, Vd 49.8 L - goal trough 15-20   Height: 5\' 11"  (180.3 cm) Weight: 157 lb (71.215 kg) IBW/kg (Calculated) : 75.3  Temp (24hrs), Avg:98.1 F (36.7 C), Min:98.1 F (36.7 C), Max:98.1 F (36.7 C)   Recent Labs Lab 04/04/16 1111 04/05/16 0558 04/06/16 0423 04/10/16 1356 04/10/16 1707  WBC 9.3 8.5 9.2 10.7*  --   CREATININE 1.00 0.88  --  1.28*  --   LATICACIDVEN  --   --   --  3.7* 2.7*    Estimated Creatinine Clearance: 46.4 mL/min (by C-G formula based on Cr of 1.28).    Allergies  Allergen Reactions  . Lodine [Etodolac] Nausea And Vomiting    Antimicrobials this admission:   Dose adjustments this admission:   Microbiology results:   Thank you for allowing pharmacy to be a part of this patient's care.  Rocky Morel 04/10/2016 8:13 PM

## 2016-04-11 ENCOUNTER — Inpatient Hospital Stay: Payer: Medicare Other

## 2016-04-11 ENCOUNTER — Inpatient Hospital Stay: Payer: Medicare Other | Admitting: Hematology and Oncology

## 2016-04-11 DIAGNOSIS — I82812 Embolism and thrombosis of superficial veins of left lower extremities: Secondary | ICD-10-CM

## 2016-04-11 DIAGNOSIS — A419 Sepsis, unspecified organism: Secondary | ICD-10-CM

## 2016-04-11 DIAGNOSIS — C7800 Secondary malignant neoplasm of unspecified lung: Secondary | ICD-10-CM

## 2016-04-11 DIAGNOSIS — C787 Secondary malignant neoplasm of liver and intrahepatic bile duct: Secondary | ICD-10-CM

## 2016-04-11 DIAGNOSIS — R188 Other ascites: Secondary | ICD-10-CM

## 2016-04-11 DIAGNOSIS — C251 Malignant neoplasm of body of pancreas: Secondary | ICD-10-CM

## 2016-04-11 DIAGNOSIS — Z8673 Personal history of transient ischemic attack (TIA), and cerebral infarction without residual deficits: Secondary | ICD-10-CM

## 2016-04-11 LAB — APTT
APTT: 79 s — AB (ref 24–36)
aPTT: 84 seconds — ABNORMAL HIGH (ref 24–36)
aPTT: 59 seconds — ABNORMAL HIGH (ref 24–36)

## 2016-04-11 LAB — BLOOD CULTURE ID PANEL (REFLEXED)
Acinetobacter baumannii: NOT DETECTED
CANDIDA ALBICANS: NOT DETECTED
CANDIDA GLABRATA: NOT DETECTED
CANDIDA PARAPSILOSIS: NOT DETECTED
CANDIDA TROPICALIS: NOT DETECTED
Candida krusei: NOT DETECTED
Carbapenem resistance: NOT DETECTED
ENTEROBACTER CLOACAE COMPLEX: NOT DETECTED
ESCHERICHIA COLI: DETECTED — AB
Enterobacteriaceae species: DETECTED — AB
Enterococcus species: NOT DETECTED
Haemophilus influenzae: NOT DETECTED
KLEBSIELLA PNEUMONIAE: NOT DETECTED
Klebsiella oxytoca: NOT DETECTED
Listeria monocytogenes: NOT DETECTED
METHICILLIN RESISTANCE: NOT DETECTED
Neisseria meningitidis: NOT DETECTED
PROTEUS SPECIES: NOT DETECTED
Pseudomonas aeruginosa: NOT DETECTED
STREPTOCOCCUS PNEUMONIAE: NOT DETECTED
Serratia marcescens: NOT DETECTED
Staphylococcus aureus (BCID): NOT DETECTED
Staphylococcus species: NOT DETECTED
Streptococcus agalactiae: NOT DETECTED
Streptococcus pyogenes: NOT DETECTED
Streptococcus species: NOT DETECTED
Vancomycin resistance: NOT DETECTED

## 2016-04-11 LAB — BASIC METABOLIC PANEL
ANION GAP: 11 (ref 5–15)
BUN: 34 mg/dL — ABNORMAL HIGH (ref 6–20)
CHLORIDE: 102 mmol/L (ref 101–111)
CO2: 18 mmol/L — ABNORMAL LOW (ref 22–32)
CREATININE: 1.31 mg/dL — AB (ref 0.61–1.24)
Calcium: 7.6 mg/dL — ABNORMAL LOW (ref 8.9–10.3)
GFR calc non Af Amer: 50 mL/min — ABNORMAL LOW (ref >60)
GFR, EST AFRICAN AMERICAN: 58 mL/min — AB (ref >60)
Glucose, Bld: 150 mg/dL — ABNORMAL HIGH (ref 65–99)
POTASSIUM: 4 mmol/L (ref 3.5–5.1)
SODIUM: 131 mmol/L — AB (ref 135–145)

## 2016-04-11 LAB — URINALYSIS COMPLETE WITH MICROSCOPIC (ARMC ONLY)
BILIRUBIN URINE: NEGATIVE
Bacteria, UA: NONE SEEN
Glucose, UA: NEGATIVE mg/dL
HGB URINE DIPSTICK: NEGATIVE
KETONES UR: NEGATIVE mg/dL
LEUKOCYTES UA: NEGATIVE
Nitrite: NEGATIVE
PH: 5 (ref 5.0–8.0)
Protein, ur: 100 mg/dL — AB
Specific Gravity, Urine: 1.057 — ABNORMAL HIGH (ref 1.005–1.030)
Squamous Epithelial / LPF: NONE SEEN

## 2016-04-11 LAB — CBC
HEMATOCRIT: 29 % — AB (ref 40.0–52.0)
HEMOGLOBIN: 9.5 g/dL — AB (ref 13.0–18.0)
MCH: 31 pg (ref 26.0–34.0)
MCHC: 32.8 g/dL (ref 32.0–36.0)
MCV: 94.5 fL (ref 80.0–100.0)
PLATELETS: 185 10*3/uL (ref 150–440)
RBC: 3.07 MIL/uL — AB (ref 4.40–5.90)
RDW: 16.7 % — ABNORMAL HIGH (ref 11.5–14.5)
WBC: 14 10*3/uL — AB (ref 3.8–10.6)

## 2016-04-11 LAB — LACTIC ACID, PLASMA: Lactic Acid, Venous: 1.8 mmol/L (ref 0.5–2.0)

## 2016-04-11 MED ORDER — PROCHLORPERAZINE EDISYLATE 5 MG/ML IJ SOLN
5.0000 mg | Freq: Four times a day (QID) | INTRAMUSCULAR | Status: DC | PRN
  Administered 2016-04-11 – 2016-04-14 (×6): 5 mg via INTRAVENOUS
  Filled 2016-04-11: qty 2
  Filled 2016-04-11 (×3): qty 1
  Filled 2016-04-11: qty 2
  Filled 2016-04-11: qty 1

## 2016-04-11 MED ORDER — TAMSULOSIN HCL 0.4 MG PO CAPS
0.4000 mg | ORAL_CAPSULE | Freq: Every day | ORAL | Status: DC
  Administered 2016-04-11 – 2016-04-16 (×6): 0.4 mg via ORAL
  Filled 2016-04-11 (×6): qty 1

## 2016-04-11 MED ORDER — SODIUM CHLORIDE 0.9 % IV SOLN
INTRAVENOUS | Status: DC
  Administered 2016-04-11 – 2016-04-12 (×2): via INTRAVENOUS

## 2016-04-11 NOTE — Progress Notes (Signed)
ANTICOAGULATION CONSULT NOTE - FOLLOW UP  Pharmacy Consult for Heparin Indication: DVT  Allergies  Allergen Reactions  . Lodine [Etodolac] Nausea And Vomiting    Patient Measurements: Height: 5\' 11"  (180.3 cm) Weight: 168 lb (76.204 kg) IBW/kg (Calculated) : 75.3 Heparin Dosing Weight: 71.2 kg  Vital Signs: Temp: 98.1 F (36.7 C) (05/04 1238) Temp Source: Oral (05/04 1238) BP: 118/71 mmHg (05/04 1238) Pulse Rate: 101 (05/04 1238)  Labs:  Recent Labs  04/10/16 1356 04/10/16 1613 04/10/16 2030 04/11/16 0408 04/11/16 1217  HGB 11.4*  --   --  9.5*  --   HCT 35.4*  --   --  29.0*  --   PLT 295  --   --  185  --   APTT  --   --  38* 84* 79*  LABPROT  --   --  23.4*  --   --   INR  --   --  2.10  --   --   HEPARINUNFRC  --   --  2.00*  --   --   CREATININE 1.28*  --   --  1.31*  --   TROPONINI  --  <0.03  --   --   --     Estimated Creatinine Clearance: 47.9 mL/min (by C-G formula based on Cr of 1.31).   Medical History: Past Medical History  Diagnosis Date  . TIA (transient ischemic attack)   . Hypertension   . Diverticulitis   . Cancer ([place])     pancreatic ca  . GERD (gastroesophageal reflux disease)   . History of hiatal hernia   . History of colonic polyps       Assessment: 82 yo male on Xarelto 15 mg PO BID PTA for recent left leg DVT now with a new probable DVT. Spoke with pt who seems uncertain but states that he does not think he took any Xarelto today, last dose last night, possibly around 8 pm.   hgb 11.4, plt 295 INR 2.1, aPTT 38, HL 2.00 Per ED RN, no s/sx of bleeding noted  Goal of Therapy:  Heparin level 0.3-0.7 units/ml Monitor platelets by anticoagulation protocol: Yes  aPTT goal 68-109 until following by heparin levels.   Plan:  No heparin bolus Will start heparin at 1150 units/hr - will start now since it is about 24h since last dose. Will follow and adjust by aPTT for now since anti-Xa elevated due to Xarelto. APTT ordered for 6  hours after start of heparin infusion. CBC in AM  5/4 AM aPTT 84. Continue current regimen. Recheck aPTT, heparin level and CBC tomorrow AM.  5/4 @ 12:00 APTT was 79. Continue current rate of heparin gtt. Order APTT for 20:00.   Dalayah Deahl D 04/11/2016,2:58 PM

## 2016-04-11 NOTE — Progress Notes (Signed)
ANTICOAGULATION CONSULT NOTE - FOLLOW UP  Pharmacy Consult for Heparin Indication: DVT  Allergies  Allergen Reactions  . Lodine [Etodolac] Nausea And Vomiting    Patient Measurements: Height: 5\' 11"  (180.3 cm) Weight: 168 lb (76.204 kg) IBW/kg (Calculated) : 75.3 Heparin Dosing Weight: 71.2 kg  Vital Signs: Temp: 97.8 F (36.6 C) (05/04 2110) Temp Source: Oral (05/04 2110) BP: 127/59 mmHg (05/04 2110) Pulse Rate: 110 (05/04 2110)  Labs:  Recent Labs  04/10/16 1356 04/10/16 1613  04/10/16 2030 04/11/16 0408 04/11/16 1217 04/11/16 2005  HGB 11.4*  --   --   --  9.5*  --   --   HCT 35.4*  --   --   --  29.0*  --   --   PLT 295  --   --   --  185  --   --   APTT  --   --   < > 38* 84* 79* 59*  LABPROT  --   --   --  23.4*  --   --   --   INR  --   --   --  2.10  --   --   --   HEPARINUNFRC  --   --   --  2.00*  --   --   --   CREATININE 1.28*  --   --   --  1.31*  --   --   TROPONINI  --  <0.03  --   --   --   --   --   < > = values in this interval not displayed.  Estimated Creatinine Clearance: 47.9 mL/min (by C-G formula based on Cr of 1.31).   Medical History: Past Medical History  Diagnosis Date  . TIA (transient ischemic attack)   . Hypertension   . Diverticulitis   . Cancer ([place])     pancreatic ca  . GERD (gastroesophageal reflux disease)   . History of hiatal hernia   . History of colonic polyps       Assessment: 82 yo male on Xarelto 15 mg PO BID PTA for recent left leg DVT now with a new probable DVT. Spoke with pt who seems uncertain but states that he does not think he took any Xarelto today, last dose last night, possibly around 8 pm.   hgb 11.4, plt 295 INR 2.1, aPTT 38, HL 2.00 Per ED RN, no s/sx of bleeding noted  Goal of Therapy:  Heparin level 0.3-0.7 units/ml Monitor platelets by anticoagulation protocol: Yes  aPTT goal 68-109 until following by heparin levels.   Plan:  No heparin bolus Will start heparin at 1150 units/hr -  will start now since it is about 24h since last dose. Will follow and adjust by aPTT for now since anti-Xa elevated due to Xarelto. APTT ordered for 6 hours after start of heparin infusion. CBC in AM  5/4 AM aPTT 84. Continue current regimen. Recheck aPTT, heparin level and CBC tomorrow AM.  5/4 @ 12:00 APTT was 79. Continue current rate of heparin gtt. Order APTT for 20:00.   5/4 :  APTT @ 20:00 = 59.  Will increase Heparin drip rate and recheck APTT 8 hrs after rate change.   Kayliegh Boyers D 04/11/2016,9:38 PM

## 2016-04-11 NOTE — Plan of Care (Signed)
Problem: Physical Regulation: Goal: Ability to maintain clinical measurements within normal limits will improve Outcome: Progressing No c/o pain. Pt denies n/v. Poor appetite. Pt had about 30% of breakfast only. Ensure encouraged. Pt uses the urinal.

## 2016-04-11 NOTE — Progress Notes (Signed)
[employer] Results  Patient had BCID results of 1/2 bottles growing Ecoli. Spoke with MD Leslye Peer and he is ok with continuing Zosyn therapy for now. He will evaluate patient momentarily and make any additional modifications in therapy if warranted.  [name], PharmD

## 2016-04-11 NOTE — Progress Notes (Signed)
Patient ID: Cody Andrade, male   DOB: Apr 12, 1933, 82 y.o.   MRN: [id number] [facility] PROGRESS NOTE  Cody Andrade DOB: 1933/01/09 DOA: 04/10/2016 PCP: No PCP Per Patient  HPI/Subjective: Patient with nausea and vomiting. Vomitus described as brownish material. No black stools. Feels better than yesterday. Had more abdominal pain yesterday than today. No diarrhea.  Objective: Filed Vitals:   04/11/16 1029 04/11/16 1238  BP: 112/63 118/71  Pulse: 110 101  Temp:  98.1 F (36.7 C)  Resp: 18 20    Filed Weights   04/10/16 1357 04/10/16 2019  Weight: 71.215 kg (157 lb) 76.204 kg (168 lb)    ROS: Review of Systems  Constitutional: Negative for fever and chills.  Eyes: Negative for blurred vision.  Respiratory: Negative for cough and shortness of breath.   Cardiovascular: Negative for chest pain.  Gastrointestinal: Positive for abdominal pain. Negative for nausea, vomiting, diarrhea and constipation.  Genitourinary: Negative for dysuria.  Musculoskeletal: Negative for joint pain.  Neurological: Negative for dizziness and headaches.   Exam: Physical Exam  Constitutional: He is oriented to person, place, and time.  HENT:  Nose: No mucosal edema.  Mouth/Throat: No oropharyngeal exudate or posterior oropharyngeal edema.  Eyes: Conjunctivae, EOM and lids are normal. Pupils are equal, round, and reactive to light.  Neck: No JVD present. Carotid bruit is not present. No edema present. No thyroid mass and no thyromegaly present.  Cardiovascular: S1 normal and S2 normal.  Tachycardia present.  Exam reveals no gallop.   Murmur heard.  Systolic murmur is present with a grade of 2/6  Pulses:      Dorsalis pedis pulses are 2+ on the right side, and 2+ on the left side.  Respiratory: No respiratory distress. He has no wheezes. He has no rhonchi. He has no rales.  GI: Soft. Bowel sounds are normal. There is generalized tenderness.  Musculoskeletal:       Right  ankle: He exhibits no swelling.       Left ankle: He exhibits no swelling.  Lymphadenopathy:    He has no cervical adenopathy.  Neurological: He is alert and oriented to person, place, and time. No cranial nerve deficit.  Skin: Skin is warm. No rash noted. Nails show no clubbing.  Psychiatric: He has a normal mood and affect.      Data Reviewed: Basic Metabolic Panel:  Recent Labs Lab 04/05/16 0558 04/10/16 1356 04/11/16 0408  NA 136 132* 131*  K 4.0 3.6 4.0  CL 105 98* 102  CO2 21* 19* 18*  GLUCOSE 89 132* 150*  BUN 21* 30* 34*  CREATININE 0.88 1.28* 1.31*  CALCIUM 8.2* 8.3* 7.6*   Liver Function Tests:  Recent Labs Lab 04/10/16 1613  AST 29  ALT 30  ALKPHOS 245*  BILITOT 1.0  PROT <3.0*  ALBUMIN 1.2*    Recent Labs Lab 04/10/16 1613  LIPASE 13   CBC:  Recent Labs Lab 04/05/16 0558 04/06/16 0423 04/10/16 1356 04/11/16 0408  WBC 8.5 9.2 10.7* 14.0*  NEUTROABS  --   --  9.7*  --   HGB 8.6* 9.0* 11.4* 9.5*  HCT 25.1* 26.6* 35.4* 29.0*  MCV 93.4 93.9 94.1 94.5  PLT 125* 145* 295 185   Cardiac Enzymes:  Recent Labs Lab 04/10/16 1613  TROPONINI <0.03     Recent Results (from the past 240 hour(s))  Blood culture (routine x 2)     Status: None (Preliminary result)   Collection Time: 04/10/16  4:13 PM  Result Value Ref Range Status   Specimen Description BLOOD RIGHT FATTY CASTS  Final   Special Requests BOTTLES DRAWN AEROBIC AND ANAEROBIC 2CCAERO,1CCANA  Final   Culture  Setup Time   Final    GRAM NEGATIVE RODS AEROBIC BOTTLE ONLY Organism ID to follow CRITICAL RESULT CALLED TO, READ BACK BY AND VERIFIED WITH: CHRISTINA KATSOUDAS ON 04/11/16 AT 0901 BY QSD    Culture NO GROWTH < 12 HOURS  Final   Report Status PENDING  Incomplete  Blood Culture ID Panel (Reflexed)     Status: Abnormal   Collection Time: 04/10/16  4:13 PM  Result Value Ref Range Status   Enterococcus species NOT DETECTED NOT DETECTED Final   Vancomycin resistance NOT  DETECTED NOT DETECTED Final   Listeria monocytogenes NOT DETECTED NOT DETECTED Final   Staphylococcus species NOT DETECTED NOT DETECTED Final   Staphylococcus aureus NOT DETECTED NOT DETECTED Final   Methicillin resistance NOT DETECTED NOT DETECTED Final   Streptococcus species NOT DETECTED NOT DETECTED Final   Streptococcus agalactiae NOT DETECTED NOT DETECTED Final   Streptococcus pneumoniae NOT DETECTED NOT DETECTED Final   Streptococcus pyogenes NOT DETECTED NOT DETECTED Final   Acinetobacter baumannii NOT DETECTED NOT DETECTED Final   Enterobacteriaceae species DETECTED (A) NOT DETECTED Final    Comment: CRITICAL RESULT CALLED TO, READ BACK BY AND VERIFIED WITH: CHRISTINA KATSOUDAS ON 04/11/16 AT 0901 BY QSD    Enterobacter cloacae complex NOT DETECTED NOT DETECTED Final   Escherichia coli DETECTED (A) NOT DETECTED Final    Comment: CRITICAL RESULT CALLED TO, READ BACK BY AND VERIFIED WITH: CHRISTINA KATSOUDAS ON 04/11/16 AT 0901 BY QSD    Klebsiella oxytoca NOT DETECTED NOT DETECTED Final   Klebsiella pneumoniae NOT DETECTED NOT DETECTED Final   Proteus species NOT DETECTED NOT DETECTED Final   Serratia marcescens NOT DETECTED NOT DETECTED Final   Carbapenem resistance NOT DETECTED NOT DETECTED Final   Haemophilus influenzae NOT DETECTED NOT DETECTED Final   Neisseria meningitidis NOT DETECTED NOT DETECTED Final   Pseudomonas aeruginosa NOT DETECTED NOT DETECTED Final   Candida albicans NOT DETECTED NOT DETECTED Final   Candida glabrata NOT DETECTED NOT DETECTED Final   Candida krusei NOT DETECTED NOT DETECTED Final   Candida parapsilosis NOT DETECTED NOT DETECTED Final   Candida tropicalis NOT DETECTED NOT DETECTED Final  Blood culture (routine x 2)     Status: None (Preliminary result)   Collection Time: 04/10/16  4:14 PM  Result Value Ref Range Status   Specimen Description BLOOD LEFT ASSIST CONTROL  Final   Special Requests BOTTLES DRAWN AEROBIC AND ANAEROBIC 5CCAERO,5CCANA   Final   Culture NO GROWTH < 24 HOURS  Final   Report Status PENDING  Incomplete     Studies: Ct Abdomen Pelvis W Contrast  04/10/2016  CLINICAL DATA:  Periumbilical pain which developed early this morning with nausea and vomiting. Metastatic pancreatic cancer. EXAM: CT ABDOMEN AND PELVIS WITH CONTRAST TECHNIQUE: Multidetector CT imaging of the abdomen and pelvis was performed using the standard protocol following bolus administration of intravenous contrast. CONTRAST:  119mL ISOVUE-300 IOPAMIDOL (ISOVUE-300) INJECTION 61% COMPARISON:  Abdominal CT dated 02/14/2016 and chest CT dated 03/28/2016 and PET-CT scan dated 02/15/2016 FINDINGS: Lower chest: New small bilateral pleural effusions. Stable 5 mm nodule in the right middle lobe peripherally. Subtle new peripheral edema at the lung bases. The lungs are hyperinflated consistent emphysema. Hepatobiliary: Numerous metastatic lesions in the liver have progressed. There is new periportal  edema. Biliary stent in place with air in the biliary tree. Pancreas: Pancreatic mass measures 3.3 x 2.5 cm, increased from 3.2 x 2.3 cm. Increased atrophy of the pancreatic tail with increased dilatation of the distal pancreatic duct. Spleen: There are 2 new linear areas of abnormal low density in the periphery of the superior aspect of the spleen consistent with splenic lacerations. The configuration is not suggestive of metastatic disease. There is no subcapsular hemorrhage. Adrenals/Urinary Tract: The adrenal glands and right kidney are normal. Benign 14 mm cyst in the upper pole of the left kidney and benign 23 mm cyst in the lower pole of the left kidney. No hydronephrosis. Bladder appears normal. Stomach/Bowel: Extensive diverticulosis of the left side of the colon. Diffuse edema of the colonic mucosa. Small bowel appears normal. Peripheral lacerations in the upper pole of the spleen without subcapsular hematoma. Vascular/Lymphatic: The left external iliac vein is enlarged  as is the left common femoral vein and there appear to be thrombi in these vessels. This is not not definitive since mixing of unenhanced blood with enhance blood could give this appearance although the distention of the vein would be atypical and is new since the prior study. Aortic atherosclerosis. Reproductive: Normal. Other: Extensive new ascites since the CT scan of 02/14/2016. Increased ascites since the CT scan of 03/28/2016. Musculoskeletal: Multilevel degenerative facet arthritis in the lumbar spine. No acute abnormality. No visible bone metastases. IMPRESSION: 1. New small bilateral pleural effusions and subtle pulmonary edema. 2. Progressive metastatic disease in the liver. 3. Progressive enlargement of the pancreatic cancer. 4. New splenic lacerations without a subcapsular hematoma or visible acute hemorrhage. 5. New probable venous thrombosis of the left external iliac vein and left common femoral vein. 6. Aortic atherosclerosis. 7. New extensive ascites since 02/14/2016, progressed since 03/28/2016 Electronically Signed   By: Lorriane Shire M.D.   On: 04/10/2016 16:01   Dg Chest Port 1 View  04/10/2016  CLINICAL DATA:  LEFT lower quadrant pain EXAM: PORTABLE CHEST 1 VIEW COMPARISON:  CT chest 03/28/2016 FINDINGS: Normal cardiac silhouette. Mild nodular airspace disease in the RIGHT lung again demonstrated. Exam is lordotic. Low lung volumes. No pulmonary edema or pneumothorax IMPRESSION: Nodular airspace disease in the RIGHT lung not changed from prior. No change from CT of 03/28/2016 Electronically Signed   By: Suzy Bouchard M.D.   On: 04/10/2016 16:26    Scheduled Meds: . atorvastatin  80 mg Oral q1800  . feeding supplement (ENSURE ENLIVE)  237 mL Oral BID BM  . megestrol  200 mg Oral Daily  . methimazole  5 mg Oral Daily  . piperacillin-tazobactam (ZOSYN)  IV  3.375 g Intravenous Q8H  . sodium chloride  1,000 mL Intravenous Once   And  . sodium chloride  250 mL Intravenous Once  .  tamsulosin  0.4 mg Oral QPC breakfast   Continuous Infusions: . sodium chloride 75 mL/hr at 04/11/16 1306  . heparin 1,150 Units/hr (04/10/16 2202)    Assessment/Plan:  1. Clinical sepsis with Escherichia coli growing in blood cultures. Lactic acidosis. Patient currently on Zosyn. Await for final sensitivities prior to changing antibiotics. 2. Metastatic pancreatic cancer to liver, ascites. Oncology consultation. Patient is a full code. Pain control. 3. Acute kidney injury and dehydration. IV fluids. 4. Hyponatremia likely secondary to poor intake 5. New venous thrombosis left external iliac and left common femoral vein. Patient is on Xarelto as outpatient on heparin drip here. 6. Vomiting brownish material. I don't think this is  a GI bleed. Monitor hemoglobin stable. 7. New splenic lacerations. Monitor closely  Code Status:     Code Status Orders        Start     Ordered   04/10/16 2002  Full code   Continuous     04/10/16 2001    Code Status History    Date Active Date Inactive Code Status Order ID Comments User Context   04/04/2016 11:59 AM 04/06/2016  5:01 PM Full Code [id number]  Lytle Butte, MD ED   02/09/2016 12:32 AM 02/11/2016  4:52 PM Full Code [id number]  Fritzi Mandes, MD Inpatient    Advance Directive Documentation        Most Recent Value   Type of Advance Directive  Healthcare Power of [place], Living will [POA: Cody Andrade (spouse)]   Pre-existing out of facility DNR order (yellow form or pink MOST form)     "MOST" Form in Place?       Family Communication: Spoke with wife on the phone Disposition Plan: To be determined  Consultants:  Oncology  Antibiotics:  Zosyn  Time spent: 25 minutes  Loletha Grayer  [employer]

## 2016-04-12 ENCOUNTER — Inpatient Hospital Stay: Payer: Medicare Other

## 2016-04-12 DIAGNOSIS — C787 Secondary malignant neoplasm of liver and intrahepatic bile duct: Secondary | ICD-10-CM

## 2016-04-12 DIAGNOSIS — C259 Malignant neoplasm of pancreas, unspecified: Secondary | ICD-10-CM | POA: Insufficient documentation

## 2016-04-12 DIAGNOSIS — IMO0002 Reserved for concepts with insufficient information to code with codable children: Secondary | ICD-10-CM | POA: Insufficient documentation

## 2016-04-12 DIAGNOSIS — C7800 Secondary malignant neoplasm of unspecified lung: Secondary | ICD-10-CM

## 2016-04-12 LAB — BASIC METABOLIC PANEL
Anion gap: 9 (ref 5–15)
BUN: 39 mg/dL — AB (ref 6–20)
CHLORIDE: 105 mmol/L (ref 101–111)
CO2: 20 mmol/L — AB (ref 22–32)
Calcium: 7.6 mg/dL — ABNORMAL LOW (ref 8.9–10.3)
Creatinine, Ser: 1.16 mg/dL (ref 0.61–1.24)
GFR calc Af Amer: >60 mL/min (ref >60)
GFR calc non Af Amer: 58 mL/min — ABNORMAL LOW (ref >60)
Glucose, Bld: 142 mg/dL — ABNORMAL HIGH (ref 65–99)
POTASSIUM: 3.8 mmol/L (ref 3.5–5.1)
SODIUM: 134 mmol/L — AB (ref 135–145)

## 2016-04-12 LAB — CBC
HEMATOCRIT: 26.6 % — AB (ref 40.0–52.0)
HEMOGLOBIN: 8.8 g/dL — AB (ref 13.0–18.0)
MCH: 30.7 pg (ref 26.0–34.0)
MCHC: 33.2 g/dL (ref 32.0–36.0)
MCV: 92.4 fL (ref 80.0–100.0)
Platelets: 189 10*3/uL (ref 150–440)
RBC: 2.88 MIL/uL — AB (ref 4.40–5.90)
RDW: 16.9 % — ABNORMAL HIGH (ref 11.5–14.5)
WBC: 16.3 10*3/uL — ABNORMAL HIGH (ref 3.8–10.6)

## 2016-04-12 LAB — HEPARIN LEVEL (UNFRACTIONATED)
HEPARIN UNFRACTIONATED: 1.53 [IU]/mL — AB (ref 0.30–0.70)
Heparin Unfractionated: 1.65 IU/mL — ABNORMAL HIGH (ref 0.30–0.70)

## 2016-04-12 LAB — BODY FLUID CELL COUNT WITH DIFFERENTIAL
EOS FL: 0 %
Lymphs, Fluid: 10 %
Monocyte-Macrophage-Serous Fluid: 4 %
Neutrophil Count, Fluid: 86 %
OTHER CELLS FL: 0 %
WBC FLUID: 24770 uL

## 2016-04-12 LAB — PROTIME-INR
INR: 1.45
PROTHROMBIN TIME: 17.7 s — AB (ref 11.4–15.0)

## 2016-04-12 LAB — PROTEIN, TOTAL: Total Protein: 5.3 g/dL — ABNORMAL LOW (ref 6.5–8.1)

## 2016-04-12 LAB — PROTEIN, BODY FLUID: Total protein, fluid: <3 g/dL

## 2016-04-12 LAB — APTT
APTT: 95 s — AB (ref 24–36)
aPTT: 55 seconds — ABNORMAL HIGH (ref 24–36)

## 2016-04-12 MED ORDER — ENSURE ENLIVE PO LIQD
237.0000 mL | Freq: Three times a day (TID) | ORAL | Status: DC
  Administered 2016-04-12 – 2016-04-15 (×2): 237 mL via ORAL

## 2016-04-12 NOTE — Progress Notes (Signed)
Pharmacy Antibiotic Note Day 1  Cody Andrade is a 82 y.o. male admitted on 04/10/2016 with sepsis.  Pharmacy has been consulted for Zosyn dosing.  Plan: Continue Zosyn 3.375 g IV q8 hours.   Height: 5\' 11"  (180.3 cm) Weight: 173 lb 11.2 oz (78.79 kg) IBW/kg (Calculated) : 75.3  Temp (24hrs), Avg:97.8 F (36.6 C), Min:97.6 F (36.4 C), Max:98.1 F (36.7 C)   Recent Labs Lab 04/06/16 0423 04/10/16 1356 04/10/16 1707 04/10/16 2027 04/10/16 2314 04/11/16 0408 04/12/16 0558  WBC 9.2 10.7*  --   --   --  14.0* 16.3*  CREATININE  --  1.28*  --   --   --  1.31* 1.16  LATICACIDVEN  --  3.7* 2.7* 3.1* 1.8  --   --     Estimated Creatinine Clearance: 54.1 mL/min (by C-G formula based on Cr of 1.16).    Allergies  Allergen Reactions  . Lodine [Etodolac] Nausea And Vomiting    Antimicrobials this admission: Vancomycin 5/3 >>> 5/4   Dose adjustments this admission:   Microbiology results:   Thank you for allowing pharmacy to be a part of this patient's care.  Shawna Wearing D 04/12/2016 9:39 AM

## 2016-04-12 NOTE — Progress Notes (Signed)
Palliative Care Update   I have examined, met and talked with pt and his wife and daughter bedside today.    I had to limit my focus of discussion to code status.  This was due to family wanting to talk to Dr. Corcoran before considering options such as home with Hospice or Hospice Home or Palliative Chemo etc etc.    I thought pt was going to opt to be DNR --his family seemed to be nodding in ways that let me know they were open to him choosing this.  He has previously chosen full code but he seemed to register the negative side of potentially being resuscitated only to die again soon on machines (making it hard on family etc etc).    He misspeaks and mispoke saying he was 'open to being full code' --when he meant to say 'open to being do-not-rescusitate'.  I clarified what he meant.  He is open to DNR ---but he wants to 'think about it'.  Family said he like to take his time making decisions.  Nurse reported pt took an hour to decide whether to get out of bed today or not.    I have conveyed this conversation and the outcome to nursing and to pts attending.  I think others need to come back and remind him he was thinking about DNR and ask him if they can go ahead and put that order in (to get it behind us and so we can move on and not have that issue needing further discussion , etc etc).  I won't be back here until Monday AFTERNOON.  If pt is here still then, I will follow up.  Full consult note to follow.  Margaret F Campbell, MD 

## 2016-04-12 NOTE — Care Management (Addendum)
Spoke with wife, Haynes Dage. [phone]). States her husband last seen Dr, Baldemar Lenis in February. Started going to [facility] March 3rd. Last chemotherapy was 2 weeks ago. No falls. Decreased appetite since January.  Would like to discuss future plans with Dr. Darnelle Going. Possible palliative chemotherapy. Possible Hospice services in the home. Discussed the different agencies available in [place]. [place].  Will not send referral until more decisions are made. Shelbie Ammons RN MSN CCM Care Management [phone]

## 2016-04-12 NOTE — Care Management Important Message (Signed)
Important Message  Patient Details  Name: Cody Andrade MRN: [id number] Date of Birth: [birth year]   Medicare Important Message Given:  Yes    Shelbie Ammons, RN 04/12/2016, 11:47 AM

## 2016-04-12 NOTE — Consult Note (Signed)
[facility]  Date of admission:  04/10/2016  Inpatient day:  04/11/2016  Consulting physician: Demetrios Loll, MD   Reason for Consultation:  Pancreatic cancer with metastasis  Chief Complaint: Cody Andrade is a 82 y.o. male with metastatic pancreatic cancer who was admitted with sepsis.  HPI: Patient presented with worsening upper abdominal pain and distention, nausea, and coffee ground emesis.   Lactic acid was 3.7. Urinalysis was negative.  CXR revealed nodular airspace disease in the right lung (stable).  Abdominal and pelvic CT scan revealed new ascites, progressive enlargement of the pancreatic cancer and metastatic disease in the liver.  There was new splenic lacerations without a subcapsular hematoma or visible acute hemorrhage.  There was questionable new venous thrombosis of the left external iliac vein and left common femoral vein.   Blood cultures have grown GNR.  Blood ID panel notes E col.  He is on Zosyn.  Symptomatically, he is feeling better.  Abdominal pain is slightly less.  He denies any diarrhea.  Past Medical History  Diagnosis Date  . TIA (transient ischemic attack)   . Hypertension   . Diverticulitis   . Cancer ([place])     pancreatic ca  . GERD (gastroesophageal reflux disease)   . History of hiatal hernia   . History of colonic polyps     Past Surgical History  Procedure Laterality Date  . Hernia repair    . Ercp N/A 02/09/2016    Procedure: ENDOSCOPIC RETROGRADE CHOLANGIOPANCREATOGRAPHY (ERCP);  Surgeon: Lucilla Lame, MD;  Location: [facility] ENDOSCOPY;  Service: Endoscopy;  Laterality: N/A;  . Skin cancer excision      x 3  . Colonoscopy    . Esophagogastroduodenoscopy    . Portacath placement Left 02/27/2016    Procedure: INSERTION PORT-A-CATH;  Surgeon: Christene Lye, MD;  Location: [facility] ORS;  Service: General;  Laterality: Left;    Family History  Problem Relation Age of Onset  . Cancer Father 21    lung  . Breast cancer  Maternal Aunt   . Brain cancer Maternal Aunt     Social History:  reports that he quit smoking about 41 years ago. He has never used smokeless tobacco. He reports that he drinks alcohol. He reports that he does not use illicit drugs.  He is accompanied by his daughter.  Allergies:  Allergies  Allergen Reactions  . Lodine [Etodolac] Nausea And Vomiting    Medications Prior to Admission  Medication Sig Dispense Refill  . aspirin 325 MG tablet Take 1 tablet (325 mg total) by mouth daily. 30 tablet 0  . atorvastatin (LIPITOR) 80 MG tablet Take 1 tablet (80 mg total) by mouth daily at 6 PM. 30 tablet 0  . docusate sodium (COLACE) 100 MG capsule Take 100 mg by mouth 2 (two) times daily as needed for mild constipation.    . feeding supplement, ENSURE ENLIVE, (ENSURE ENLIVE) LIQD Take 237 mLs by mouth 2 (two) times daily between meals. 237 mL 12  . lidocaine-prilocaine (EMLA) cream Apply 1 application topically as needed (prior to accessing port).    . magnesium hydroxide (MILK OF MAGNESIA) 400 MG/5ML suspension Take 30 mLs by mouth daily as needed for mild constipation.    . megestrol (MEGACE) 400 MG/10ML suspension Take 5 mLs (200 mg total) by mouth daily. 150 mL 1  . methimazole (TAPAZOLE) 10 MG tablet Take 5 mg by mouth daily.     . ondansetron (ZOFRAN-ODT) 4 MG disintegrating tablet Take 1 tablet (  4 mg total) by mouth every 6 (six) hours as needed for nausea. Reported on 02/14/2016 30 tablet 2  . oxyCODONE (OXY IR/ROXICODONE) 5 MG immediate release tablet Take 1 tablet (5 mg total) by mouth every 4 (four) hours as needed for moderate pain. 40 tablet 0  . Rivaroxaban (XARELTO) 15 MG TABS tablet Take 15 mg by mouth 2 (two) times daily with a meal.    . simethicone (MYLICON) 80 MG chewable tablet Chew 80 mg by mouth every 6 (six) hours as needed for flatulence.    Marland Kitchen zolpidem (AMBIEN) 5 MG tablet Take 1 tablet (5 mg total) by mouth at bedtime as needed for sleep. 30 tablet 0    Review of  Systems: GENERAL: General fatigue. No fevers or sweats. PERFORMANCE STATUS (ECOG): 2-3 HEENT: Seasonal allergies. No visual changes, sore throat, mouth sores or tenderness. Lungs: No shortness of breath or cough. No hemoptysis. Cardiac: No chest pain, palpitations, orthopnea, or PND. GI: Appetite modest.  Nausea.  H/o coffee ground emesis.  Epigastric pain, improved.No diarrhea, constipation, melena or hematochezia. GU: No urgency, frequency, dysuria, or hematuria. Musculoskeletal: Back pain. No joint pain. No muscle tenderness. Extremities: No pain or swelling. Skin: No rashes or skin changes. Neuro: General weakness.No headache, numbness or weakness, balance or coordination issues. Endocrine: No diabetes, thyroid issues, hot flashes or night sweats. Psych: No anxiety. Pain: Upper abdominal pain. Review of systems: All other systems reviewed and found to be negative  Physical Exam:  Blood pressure 127/59, pulse 110, temperature 97.8 F (36.6 C), temperature source Oral, resp. rate 20, height 5' 11"  (1.803 m), weight 168 lb (76.204 kg), SpO2 95 %.GENERAL: Well developed, well nourished, gentleman sitting comfortably on the medical unit in no acute distress. MENTAL STATUS: Alert and oriented to person, place and time. HEAD: Pearline Cables hair. Normocephalic, atraumatic, face symmetric, no Cushingoid features. EYES: Glasses. Hazel eyes. Pupils equal round and reactive to light and accomodation. No conjunctivitis or scleral icterus. ENT: Oropharynx clear without lesion. Tongue normal. Mucous membranes moist. RESPIRATORY: Clear to auscultation without rales, wheezes or rhonchi. CARDIOVASCULAR: Regular rate and rhythm without murmur, rub or gallop. ABDOMEN: Soft, slightly distended, tender in the epigastric region without guarding or rebound tenderness.  No hepatosplenomegaly. No masses. SKIN: No rashes, ulcers or lesions. EXTREMITIES: Left lower extremity 2+  edema. No skin discoloration or tenderness. LYMPH NODES: No palpable cervical, supraclavicular, axillary or inguinal adenopathy  NEUROLOGICAL: Unremarkable. PSYCH: Appropriate.   Results for orders placed or performed during the hospital encounter of 04/10/16 (from the past 48 hour(s))  CBC with Differential     Status: Abnormal   Collection Time: 04/10/16  1:56 PM  Result Value Ref Range   WBC 10.7 (H) 3.8 - 10.6 K/uL   RBC 3.76 (L) 4.40 - 5.90 MIL/uL   Hemoglobin 11.4 (L) 13.0 - 18.0 g/dL   HCT 35.4 (L) 40.0 - 52.0 %   MCV 94.1 80.0 - 100.0 fL   MCH 30.3 26.0 - 34.0 pg   MCHC 32.2 32.0 - 36.0 g/dL   RDW 16.2 (H) 11.5 - 14.5 %   Platelets 295 150 - 440 K/uL    Comment: PLATELET COUNT CONFIRMED BY SMEAR   Neutrophils Relative % 90% %   Neutro Abs 9.7 (H) 1.4 - 6.5 K/uL   Lymphocytes Relative 2% %   Lymphs Abs 0.2 (L) 1.0 - 3.6 K/uL   Monocytes Relative 7% %   Monocytes Absolute 0.7 0.2 - 1.0 K/uL   Eosinophils Relative 0% %  Eosinophils Absolute 0.0 0 - 0.7 K/uL   Basophils Relative 1% %   Basophils Absolute 0.1 0 - 0.1 K/uL  Basic metabolic panel     Status: Abnormal   Collection Time: 04/10/16  1:56 PM  Result Value Ref Range   Sodium 132 (L) 135 - 145 mmol/L   Potassium 3.6 3.5 - 5.1 mmol/L   Chloride 98 (L) 101 - 111 mmol/L   CO2 19 (L) 22 - 32 mmol/L   Glucose, Bld 132 (H) 65 - 99 mg/dL   BUN 30 (H) 6 - 20 mg/dL   Creatinine, Ser 1.28 (H) 0.61 - 1.24 mg/dL   Calcium 8.3 (L) 8.9 - 10.3 mg/dL   GFR calc non Af Amer 51 (L) >60 mL/min   GFR calc Af Amer 59 (L) >60 mL/min    Comment: (NOTE) The eGFR has been calculated using the CKD EPI equation. This calculation has not been validated in all clinical situations. eGFR's persistently <60 mL/min signify possible Chronic Kidney Disease.    Anion gap 15 5 - 15  Lactic acid, plasma     Status: Abnormal   Collection Time: 04/10/16  1:56 PM  Result Value Ref Range   Lactic Acid, Venous 3.7 (HH) 0.5 - 2.0 mmol/L     Comment: CRITICAL RESULT CALLED TO, READ BACK BY AND VERIFIED WITH KIM MAIN AT 1502 04/10/16 MLZ   Blood culture (routine x 2)     Status: None (Preliminary result)   Collection Time: 04/10/16  4:13 PM  Result Value Ref Range   Specimen Description BLOOD RIGHT FATTY CASTS    Special Requests BOTTLES DRAWN AEROBIC AND ANAEROBIC [id number]    Culture  Setup Time      GRAM NEGATIVE RODS AEROBIC BOTTLE ONLY Organism ID to follow CRITICAL RESULT CALLED TO, READ BACK BY AND VERIFIED WITH: CHRISTINA KATSOUDAS ON 04/11/16 AT 0901 BY QSD    Culture NO GROWTH < 12 HOURS    Report Status PENDING   Troponin I     Status: None   Collection Time: 04/10/16  4:13 PM  Result Value Ref Range   Troponin I <0.03 <0.031 ng/mL    Comment:        NO INDICATION OF MYOCARDIAL INJURY.   Lipase, blood     Status: None   Collection Time: 04/10/16  4:13 PM  Result Value Ref Range   Lipase 13 11 - 51 U/L  Hepatic function panel     Status: Abnormal   Collection Time: 04/10/16  4:13 PM  Result Value Ref Range   Total Protein <3.0 (L) 6.5 - 8.1 g/dL   Albumin 1.2 (L) 3.5 - 5.0 g/dL   AST 29 15 - 41 U/L   ALT 30 17 - 63 U/L   Alkaline Phosphatase 245 (H) 38 - 126 U/L   Total Bilirubin 1.0 0.3 - 1.2 mg/dL   Bilirubin, Direct 0.5 0.1 - 0.5 mg/dL   Indirect Bilirubin 0.5 0.3 - 0.9 mg/dL  Blood Culture ID Panel (Reflexed)     Status: Abnormal   Collection Time: 04/10/16  4:13 PM  Result Value Ref Range   Enterococcus species NOT DETECTED NOT DETECTED   Vancomycin resistance NOT DETECTED NOT DETECTED   Listeria monocytogenes NOT DETECTED NOT DETECTED   Staphylococcus species NOT DETECTED NOT DETECTED   Staphylococcus aureus NOT DETECTED NOT DETECTED   Methicillin resistance NOT DETECTED NOT DETECTED   Streptococcus species NOT DETECTED NOT DETECTED   Streptococcus agalactiae NOT DETECTED NOT DETECTED  Streptococcus pneumoniae NOT DETECTED NOT DETECTED   Streptococcus pyogenes NOT DETECTED NOT DETECTED    Acinetobacter baumannii NOT DETECTED NOT DETECTED   Enterobacteriaceae species DETECTED (A) NOT DETECTED    Comment: CRITICAL RESULT CALLED TO, READ BACK BY AND VERIFIED WITH: CHRISTINA KATSOUDAS ON 04/11/16 AT 0901 BY QSD    Enterobacter cloacae complex NOT DETECTED NOT DETECTED   Escherichia coli DETECTED (A) NOT DETECTED    Comment: CRITICAL RESULT CALLED TO, READ BACK BY AND VERIFIED WITH: CHRISTINA KATSOUDAS ON 04/11/16 AT 0901 BY QSD    Klebsiella oxytoca NOT DETECTED NOT DETECTED   Klebsiella pneumoniae NOT DETECTED NOT DETECTED   Proteus species NOT DETECTED NOT DETECTED   Serratia marcescens NOT DETECTED NOT DETECTED   Carbapenem resistance NOT DETECTED NOT DETECTED   Haemophilus influenzae NOT DETECTED NOT DETECTED   Neisseria meningitidis NOT DETECTED NOT DETECTED   Pseudomonas aeruginosa NOT DETECTED NOT DETECTED   Candida albicans NOT DETECTED NOT DETECTED   Candida glabrata NOT DETECTED NOT DETECTED   Candida krusei NOT DETECTED NOT DETECTED   Candida parapsilosis NOT DETECTED NOT DETECTED   Candida tropicalis NOT DETECTED NOT DETECTED  Blood culture (routine x 2)     Status: None (Preliminary result)   Collection Time: 04/10/16  4:14 PM  Result Value Ref Range   Specimen Description BLOOD LEFT ASSIST CONTROL    Special Requests BOTTLES DRAWN AEROBIC AND ANAEROBIC [id number]    Culture NO GROWTH < 24 HOURS    Report Status PENDING   Lactic acid, plasma     Status: Abnormal   Collection Time: 04/10/16  5:07 PM  Result Value Ref Range   Lactic Acid, Venous 2.7 (HH) 0.5 - 2.0 mmol/L    Comment: CRITICAL RESULT CALLED TO, READ BACK BY AND VERIFIED WITH KIM MAIN AT 1801 04/10/16 MLZ   Lactic acid, plasma     Status: Abnormal   Collection Time: 04/10/16  8:27 PM  Result Value Ref Range   Lactic Acid, Venous 3.1 (HH) 0.5 - 2.0 mmol/L    Comment: CRITICAL RESULT CALLED TO, READ BACK BY AND VERIFIED WITH CAROLINE SONBAY AT 2229 04/10/16 MLZ   APTT     Status: Abnormal    Collection Time: 04/10/16  8:30 PM  Result Value Ref Range   aPTT 38 (H) 24 - 36 seconds    Comment:        IF BASELINE aPTT IS ELEVATED, SUGGEST PATIENT RISK ASSESSMENT BE USED TO DETERMINE APPROPRIATE ANTICOAGULANT THERAPY.   Protime-INR     Status: Abnormal   Collection Time: 04/10/16  8:30 PM  Result Value Ref Range   Prothrombin Time 23.4 (H) 11.4 - 15.0 seconds   INR 2.10   Heparin level (unfractionated)     Status: Abnormal   Collection Time: 04/10/16  8:30 PM  Result Value Ref Range   Heparin Unfractionated 2.00 (H) 0.30 - 0.70 IU/mL    Comment:        IF HEPARIN RESULTS ARE BELOW EXPECTED VALUES, AND PATIENT DOSAGE HAS BEEN CONFIRMED, SUGGEST FOLLOW UP TESTING OF ANTITHROMBIN III LEVELS. RESULT REPEATED AND VERIFIED   Procalcitonin     Status: None   Collection Time: 04/10/16  8:30 PM  Result Value Ref Range   Procalcitonin 63.79 ng/mL    Comment:        Interpretation: PCT >= 10 ng/mL: Important systemic inflammatory response, almost exclusively due to severe bacterial sepsis or septic shock. (NOTE)  ICU PCT Algorithm               Non ICU PCT Algorithm    ----------------------------     ------------------------------         PCT < 0.25 ng/mL                 PCT < 0.1 ng/mL     Stopping of antibiotics            Stopping of antibiotics       strongly encouraged.               strongly encouraged.    ----------------------------     ------------------------------       PCT level decrease by               PCT < 0.25 ng/mL       >= 80% from peak PCT       OR PCT 0.25 - 0.5 ng/mL          Stopping of antibiotics                                             encouraged.     Stopping of antibiotics           encouraged.    ----------------------------     ------------------------------       PCT level decrease by              PCT >= 0.25 ng/mL       < 80% from peak PCT        AND PCT >= 0.5 ng/mL             Continuing antibiotics                                               encouraged.       Continuing antibiotics            encouraged.    ----------------------------     ------------------------------     PCT level increase compared          PCT > 0.5 ng/mL         with peak PCT AND          PCT >= 0.5 ng/mL             Escalation of antibiotics                                          strongly encouraged.      Escalation of antibiotics        strongly encouraged.   Lactic acid, plasma     Status: None   Collection Time: 04/10/16 11:14 PM  Result Value Ref Range   Lactic Acid, Venous 1.8 0.5 - 2.0 mmol/L  Basic metabolic panel     Status: Abnormal   Collection Time: 04/11/16  4:08 AM  Result Value Ref Range   Sodium 131 (L) 135 - 145 mmol/L   Potassium 4.0 3.5 - 5.1 mmol/L   Chloride 102 101 - 111 mmol/L   CO2 18 (L) 22 -  32 mmol/L   Glucose, Bld 150 (H) 65 - 99 mg/dL   BUN 34 (H) 6 - 20 mg/dL   Creatinine, Ser 1.31 (H) 0.61 - 1.24 mg/dL   Calcium 7.6 (L) 8.9 - 10.3 mg/dL   GFR calc non Af Amer 50 (L) >60 mL/min   GFR calc Af Amer 58 (L) >60 mL/min    Comment: (NOTE) The eGFR has been calculated using the CKD EPI equation. This calculation has not been validated in all clinical situations. eGFR's persistently <60 mL/min signify possible Chronic Kidney Disease.    Anion gap 11 5 - 15  CBC     Status: Abnormal   Collection Time: 04/11/16  4:08 AM  Result Value Ref Range   WBC 14.0 (H) 3.8 - 10.6 K/uL   RBC 3.07 (L) 4.40 - 5.90 MIL/uL   Hemoglobin 9.5 (L) 13.0 - 18.0 g/dL   HCT 29.0 (L) 40.0 - 52.0 %   MCV 94.5 80.0 - 100.0 fL   MCH 31.0 26.0 - 34.0 pg   MCHC 32.8 32.0 - 36.0 g/dL   RDW 16.7 (H) 11.5 - 14.5 %   Platelets 185 150 - 440 K/uL  APTT     Status: Abnormal   Collection Time: 04/11/16  4:08 AM  Result Value Ref Range   aPTT 84 (H) 24 - 36 seconds    Comment:        IF BASELINE aPTT IS ELEVATED, SUGGEST PATIENT RISK ASSESSMENT BE USED TO DETERMINE APPROPRIATE ANTICOAGULANT THERAPY.   Urinalysis  complete, with microscopic ([facility] only)     Status: Abnormal   Collection Time: 04/11/16  9:35 AM  Result Value Ref Range   Color, Urine YELLOW (A) YELLOW   APPearance TURBID (A) CLEAR   Glucose, UA NEGATIVE NEGATIVE mg/dL   Bilirubin Urine NEGATIVE NEGATIVE   Ketones, ur NEGATIVE NEGATIVE mg/dL   Specific Gravity, Urine 1.057 (H) 1.005 - 1.030   Hgb urine dipstick NEGATIVE NEGATIVE   pH 5.0 5.0 - 8.0   Protein, ur 100 (A) NEGATIVE mg/dL   Nitrite NEGATIVE NEGATIVE   Leukocytes, UA NEGATIVE NEGATIVE   RBC / HPF 6-30 0 - 5 RBC/hpf   WBC, UA 0-5 0 - 5 WBC/hpf   Bacteria, UA NONE SEEN NONE SEEN   Squamous Epithelial / LPF NONE SEEN NONE SEEN   Mucous PRESENT   APTT     Status: Abnormal   Collection Time: 04/11/16 12:17 PM  Result Value Ref Range   aPTT 79 (H) 24 - 36 seconds    Comment:        IF BASELINE aPTT IS ELEVATED, SUGGEST PATIENT RISK ASSESSMENT BE USED TO DETERMINE APPROPRIATE ANTICOAGULANT THERAPY.   APTT     Status: Abnormal   Collection Time: 04/11/16  8:05 PM  Result Value Ref Range   aPTT 59 (H) 24 - 36 seconds    Comment:        IF BASELINE aPTT IS ELEVATED, SUGGEST PATIENT RISK ASSESSMENT BE USED TO DETERMINE APPROPRIATE ANTICOAGULANT THERAPY.    Ct Abdomen Pelvis W Contrast  04/10/2016  CLINICAL DATA:  Periumbilical pain which developed early this morning with nausea and vomiting. Metastatic pancreatic cancer. EXAM: CT ABDOMEN AND PELVIS WITH CONTRAST TECHNIQUE: Multidetector CT imaging of the abdomen and pelvis was performed using the standard protocol following bolus administration of intravenous contrast. CONTRAST:  165m ISOVUE-300 IOPAMIDOL (ISOVUE-300) INJECTION 61% COMPARISON:  Abdominal CT dated 02/14/2016 and chest CT dated 03/28/2016 and PET-CT scan dated 02/15/2016 FINDINGS:  Lower chest: New small bilateral pleural effusions. Stable 5 mm nodule in the right middle lobe peripherally. Subtle new peripheral edema at the lung bases. The lungs are  hyperinflated consistent emphysema. Hepatobiliary: Numerous metastatic lesions in the liver have progressed. There is new periportal edema. Biliary stent in place with air in the biliary tree. Pancreas: Pancreatic mass measures 3.3 x 2.5 cm, increased from 3.2 x 2.3 cm. Increased atrophy of the pancreatic tail with increased dilatation of the distal pancreatic duct. Spleen: There are 2 new linear areas of abnormal low density in the periphery of the superior aspect of the spleen consistent with splenic lacerations. The configuration is not suggestive of metastatic disease. There is no subcapsular hemorrhage. Adrenals/Urinary Tract: The adrenal glands and right kidney are normal. Benign 14 mm cyst in the upper pole of the left kidney and benign 23 mm cyst in the lower pole of the left kidney. No hydronephrosis. Bladder appears normal. Stomach/Bowel: Extensive diverticulosis of the left side of the colon. Diffuse edema of the colonic mucosa. Small bowel appears normal. Peripheral lacerations in the upper pole of the spleen without subcapsular hematoma. Vascular/Lymphatic: The left external iliac vein is enlarged as is the left common femoral vein and there appear to be thrombi in these vessels. This is not not definitive since mixing of unenhanced blood with enhance blood could give this appearance although the distention of the vein would be atypical and is new since the prior study. Aortic atherosclerosis. Reproductive: Normal. Other: Extensive new ascites since the CT scan of 02/14/2016. Increased ascites since the CT scan of 03/28/2016. Musculoskeletal: Multilevel degenerative facet arthritis in the lumbar spine. No acute abnormality. No visible bone metastases. IMPRESSION: 1. New small bilateral pleural effusions and subtle pulmonary edema. 2. Progressive metastatic disease in the liver. 3. Progressive enlargement of the pancreatic cancer. 4. New splenic lacerations without a subcapsular hematoma or visible acute  hemorrhage. 5. New probable venous thrombosis of the left external iliac vein and left common femoral vein. 6. Aortic atherosclerosis. 7. New extensive ascites since 02/14/2016, progressed since 03/28/2016 Electronically Signed   By: Lorriane Shire M.D.   On: 04/10/2016 16:01   Dg Chest Port 1 View  04/10/2016  CLINICAL DATA:  LEFT lower quadrant pain EXAM: PORTABLE CHEST 1 VIEW COMPARISON:  CT chest 03/28/2016 FINDINGS: Normal cardiac silhouette. Mild nodular airspace disease in the RIGHT lung again demonstrated. Exam is lordotic. Low lung volumes. No pulmonary edema or pneumothorax IMPRESSION: Nodular airspace disease in the RIGHT lung not changed from prior. No change from CT of 03/28/2016 Electronically Signed   By: Suzy Bouchard M.D.   On: 04/10/2016 16:26    Assessment:  The patient is a 82 y.o.  gentleman with metastatic pancreatic cancer s/p 1 cycle of gemcitabine (began 03/07/2016).  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  He presented with abdominal pain, coffee ground emesis, and sepsis.  Blood cultures are growing E coli.  Plan:   1.  Hematology:  Patient on heparin for questionable new thrombosis on Xarelto in addition to possible upper GI bleed (coffee ground emesis).  Reviewed films with radiology who doubts imaging represents a new clot, but current extent of clot not visualized with initial duplex on 03/29/2016.  Unclear if Xarelto failure.  Consult vascular surgery for evaluation.  Patient may need to be on Lovenox as his anticoagulant in the outpatient department.  2.  Oncology:  Discussed with patient and daughter at length today regarding current imaging and status of disease.  Patient would like to receive further chemotherapy in the outpatient department (gemcitabine and Abraxane).  His daughter was interested in clinical trials.  Currently no available clinical  trials given his performance status and multiple complications.  3.  Gastroenterology:  Progressive pancreatic cancer with liver mets and ascites.  Etiology of splenic lacerations unclear (no prior trauma).  Guaiac all stools.  If positive, consider GI consult.  4. Pulmonary:  Patient short of breath with minimal exertion.  He would like to see if he qualifies for home oxygen.  Thank you for allowing me to participate in Cody Andrade 's care.  I will follow him closely with you while hospitalized and after discharge in the outpatient department.   Lequita Asal, MD  04/11/2016

## 2016-04-12 NOTE — Progress Notes (Signed)
ANTICOAGULATION CONSULT NOTE - FOLLOW UP  Pharmacy Consult for Heparin Indication: DVT  Allergies  Allergen Reactions  . Lodine [Etodolac] Nausea And Vomiting    Patient Measurements: Height: 5\' 11"  (180.3 cm) Weight: 173 lb 11.2 oz (78.79 kg) IBW/kg (Calculated) : 75.3 Heparin Dosing Weight: 71.2 kg  Vital Signs: Temp: 97.6 F (36.4 C) (05/05 0437) BP: 117/51 mmHg (05/05 0437) Pulse Rate: 110 (05/05 0437)  Labs:  Recent Labs  04/10/16 1356 04/10/16 1613  04/10/16 2030 04/11/16 0408 04/11/16 1217 04/11/16 2005 04/12/16 0558  HGB 11.4*  --   --   --  9.5*  --   --  8.8*  HCT 35.4*  --   --   --  29.0*  --   --  26.6*  PLT 295  --   --   --  185  --   --  189  APTT  --   --   < > 38* 84* 79* 59* 95*  LABPROT  --   --   --  23.4*  --   --   --   --   INR  --   --   --  2.10  --   --   --   --   HEPARINUNFRC  --   --   --  2.00*  --   --   --  1.65*  CREATININE 1.28*  --   --   --  1.31*  --   --  1.16  TROPONINI  --  <0.03  --   --   --   --   --   --   < > = values in this interval not displayed.  Estimated Creatinine Clearance: 54.1 mL/min (by C-G formula based on Cr of 1.16).   Medical History: Past Medical History  Diagnosis Date  . TIA (transient ischemic attack)   . Hypertension   . Diverticulitis   . Cancer ([place])     pancreatic ca  . GERD (gastroesophageal reflux disease)   . History of hiatal hernia   . History of colonic polyps       Assessment: 82 yo male on Xarelto 15 mg PO BID PTA for recent left leg DVT now with a new probable DVT. Spoke with pt who seems uncertain but states that he does not think he took any Xarelto today, last dose last night, possibly around 8 pm.   hgb 11.4, plt 295 INR 2.1, aPTT 38, HL 2.00 Per ED RN, no s/sx of bleeding noted  Goal of Therapy:  Heparin level 0.3-0.7 units/ml Monitor platelets by anticoagulation protocol: Yes  aPTT goal 68-109 until following by heparin levels.   Plan:  No heparin bolus Will  start heparin at 1150 units/hr - will start now since it is about 24h since last dose. Will follow and adjust by aPTT for now since anti-Xa elevated due to Xarelto. APTT ordered for 6 hours after start of heparin infusion. CBC in AM  5/4 AM aPTT 84. Continue current regimen. Recheck aPTT, heparin level and CBC tomorrow AM.  5/4 @ 12:00 APTT was 79. Continue current rate of heparin gtt. Order APTT for 20:00.   5/4 :  APTT @ 20:00 = 59.  Will increase Heparin drip rate and recheck APTT 8 hrs after rate change. ''  5/5 AM aPTT 95. Continue current regimen. Recheck APTT @ 1400.   Emma Schupp D 04/12/2016,9:41 AM

## 2016-04-12 NOTE — Progress Notes (Signed)
Pt is alert and oriented, growing gradually weaker throughout shift, up in chair with 1 person assist and up to bsc with persistent motivation. Full liquid diet with poor appetite, sips of fluids, IV fluids d/c per MD, nausea improved with compazine, abdominal pain treated with morphine. Daughters at bedside, palliative consulted to address code status but family wishes to hold off on making a decision on code status for now, family spoke with Dr. Danella Deis over the phone concerning care and plan. Continues on heparin drip, rate increased to 15 ml/hr. [place] with paracentesis performed. Pt c/o SOB, placed on oxygen with improvement, pt appears to be declining in status, physicians involved in care made aware in person today.

## 2016-04-12 NOTE — Progress Notes (Signed)
Notified Dr. Posey Pronto that pt is complaining of shortness of breath at rest, 1.5 L oxygen applied, patient has poor appetite, poor mobility and remains a full code. MD acknowledged, verbal in person.

## 2016-04-12 NOTE — Clinical Documentation Improvement (Signed)
Internal Medicine Oncology  Abnormal Lab/Test Results:   Hemoglobin 13.0 - 18.0 g/dL 8.8 (L) 9.5 (L) 11.4 (L       HCT 40.0 - 52.0 % 26.6 (L) 29.0 (L) 35.4 (         Possible Clinical Conditions associated with below indicators:      Chronic anemia     Deficiency anemia (please specify type)     Due to/in/with neoplastic disease     Iron deficiency anemia     Macrocytic anemia     Microcytic anemia     Normocytic anemia     Pernicious anemia     Other anemia (please specify)   Other Condition  Cannot Clinically Determine   Supporting Information: Pancreatic Ca w liver mets and lung mets    Evaluation :  CBC daily    Please exercise your independent, professional judgment when responding. A specific answer is not anticipated or expected.   Thank You,  [place] [phone]

## 2016-04-12 NOTE — Progress Notes (Signed)
ANTICOAGULATION CONSULT NOTE - FOLLOW UP  Pharmacy Consult for Heparin Indication: DVT  Allergies  Allergen Reactions  . Lodine [Etodolac] Nausea And Vomiting    Patient Measurements: Height: 5\' 11"  (180.3 cm) Weight: 173 lb 11.2 oz (78.79 kg) IBW/kg (Calculated) : 75.3 Heparin Dosing Weight: 71.2 kg  Vital Signs: Temp: 97.6 F (36.4 C) (05/05 0437) Temp Source: Oral (05/04 2110) BP: 117/51 mmHg (05/05 0437) Pulse Rate: 110 (05/05 0437)  Labs:  Recent Labs  04/10/16 1356 04/10/16 1613  04/10/16 2030 04/11/16 0408 04/11/16 1217 04/11/16 2005 04/12/16 0558  HGB 11.4*  --   --   --  9.5*  --   --  8.8*  HCT 35.4*  --   --   --  29.0*  --   --  26.6*  PLT 295  --   --   --  185  --   --  189  APTT  --   --   < > 38* 84* 79* 59* 95*  LABPROT  --   --   --  23.4*  --   --   --   --   INR  --   --   --  2.10  --   --   --   --   HEPARINUNFRC  --   --   --  2.00*  --   --   --  1.65*  CREATININE 1.28*  --   --   --  1.31*  --   --  1.16  TROPONINI  --  <0.03  --   --   --   --   --   --   < > = values in this interval not displayed.  Estimated Creatinine Clearance: 54.1 mL/min (by C-G formula based on Cr of 1.16).   Medical History: Past Medical History  Diagnosis Date  . TIA (transient ischemic attack)   . Hypertension   . Diverticulitis   . Cancer ([place])     pancreatic ca  . GERD (gastroesophageal reflux disease)   . History of hiatal hernia   . History of colonic polyps       Assessment: 82 yo male on Xarelto 15 mg PO BID PTA for recent left leg DVT now with a new probable DVT. Spoke with pt who seems uncertain but states that he does not think he took any Xarelto today, last dose last night, possibly around 8 pm.   hgb 11.4, plt 295 INR 2.1, aPTT 38, HL 2.00 Per ED RN, no s/sx of bleeding noted  Goal of Therapy:  Heparin level 0.3-0.7 units/ml Monitor platelets by anticoagulation protocol: Yes  aPTT goal 68-109 until following by heparin levels.    Plan:  No heparin bolus Will start heparin at 1150 units/hr - will start now since it is about 24h since last dose. Will follow and adjust by aPTT for now since anti-Xa elevated due to Xarelto. APTT ordered for 6 hours after start of heparin infusion. CBC in AM  5/4 AM aPTT 84. Continue current regimen. Recheck aPTT, heparin level and CBC tomorrow AM.  5/4 @ 12:00 APTT was 79. Continue current rate of heparin gtt. Order APTT for 20:00.   5/4 :  APTT @ 20:00 = 59.  Will increase Heparin drip rate and recheck APTT 8 hrs after rate change. ''  5/5 AM aPTT 95. Continue current regimen,. Recheck aPTT, CBC, and heparin level tomorrow AM.  Darnetta Kesselman S 04/12/2016,6:59 AM

## 2016-04-12 NOTE — Progress Notes (Signed)
Patient ID: Cody Andrade, male   DOB: [birth year], 82 y.o.   MRN: [id number] [facility] PROGRESS NOTE  Cody Andrade [id number] DOB: 10-Jun-1933 DOA: 04/10/2016 PCP: No PCP Per Patient  HPI/Subjective:  Patient  complaints of abdominal distentionand some shortness of breath   Objective: Filed Vitals:   04/12/16 1359 04/12/16 1434  BP: 117/63 104/58  Pulse: 115 110  Temp:    Resp: 22 22    Filed Weights   04/10/16 2019 04/12/16 0437 04/12/16 1000  Weight: 76.204 kg (168 lb) 78.79 kg (173 lb 11.2 oz) 76.93 kg (169 lb 9.6 oz)    ROS: Review of Systems  Constitutional: Negative for fever and chills.  Eyes: Negative for blurred vision.  Respiratory: Negative for cough and positiveshortness of breath.   Cardiovascular: Negative for chest pain.  Gastrointestinal: Positive for abdominal pain10 distention. Negative for nausea, vomiting, diarrhea and constipation.  Genitourinary: Negative for dysuria.  Musculoskeletal: Negative for joint pain.  Neurological: Negative for dizziness and headaches.   Exam: Physical Exam  Constitutional: He is oriented to person, place, and time.  HENT:  Nose: No mucosal edema.  Mouth/Throat: No oropharyngeal exudate or posterior oropharyngeal edema.  Eyes: Conjunctivae, EOM and lids are normal. Pupils are equal, round, and reactive to light.  Neck: No JVD present. Carotid bruit is not present. No edema present. No thyroid mass and no thyromegaly present.  Cardiovascular: S1 normal and S2 normal.  Tachycardia present.  Exam reveals no gallop.   Murmur heard.  Systolic murmur is present with a grade of 2/6  Pulses:      Dorsalis pedis pulses are 2+ on the right side, and 2+ on the left side.  Respiratory: No respiratory distress. He has no wheezes. He has no rhonchi. He has no rales.  GI: Soft. Bowel sounds are normal. There is generalized tenderness.  Musculoskeletal:       Right ankle: He exhibits no swelling.       Left ankle: He  exhibits no swelling.  Lymphadenopathy:    He has no cervical adenopathy.  Neurological: He is alert and oriented to person, place, and time. No cranial nerve deficit.  Skin: Skin is warm. No rash noted. Nails show no clubbing.  Psychiatric: He has a normal mood and affect.      Data Reviewed: Basic Metabolic Panel:  Recent Labs Lab 04/10/16 1356 04/11/16 0408 04/12/16 0558  NA 132* 131* 134*  K 3.6 4.0 3.8  CL 98* 102 105  CO2 19* 18* 20*  GLUCOSE 132* 150* 142*  BUN 30* 34* 39*  CREATININE 1.28* 1.31* 1.16  CALCIUM 8.3* 7.6* 7.6*   Liver Function Tests:  Recent Labs Lab 04/10/16 1613  AST 29  ALT 30  ALKPHOS 245*  BILITOT 1.0  PROT <3.0*  ALBUMIN 1.2*    Recent Labs Lab 04/10/16 1613  LIPASE 13   CBC:  Recent Labs Lab 04/06/16 0423 04/10/16 1356 04/11/16 0408 04/12/16 0558  WBC 9.2 10.7* 14.0* 16.3*  NEUTROABS  --  9.7*  --   --   HGB 9.0* 11.4* 9.5* 8.8*  HCT 26.6* 35.4* 29.0* 26.6*  MCV 93.9 94.1 94.5 92.4  PLT 145* 295 185 189   Cardiac Enzymes:  Recent Labs Lab 04/10/16 1613  TROPONINI <0.03     Recent Results (from the past 240 hour(s))  Blood culture (routine x 2)     Status: Abnormal (Preliminary result)   Collection Time: 04/10/16  4:13 PM  Result Value Ref  Range Status   Specimen Description BLOOD RIGHT FATTY CASTS  Final   Special Requests BOTTLES DRAWN AEROBIC AND ANAEROBIC [id number],[id number]  Final   Culture  Setup Time   Final    GRAM NEGATIVE RODS AEROBIC BOTTLE ONLY Organism ID to follow CRITICAL RESULT CALLED TO, READ BACK BY AND VERIFIED WITH: CHRISTINA KATSOUDAS ON 04/11/16 AT 0901 BY QSD    Culture (A)  Final    ESCHERICHIA COLI AEROBIC BOTTLE ONLY SUSCEPTIBILITIES TO FOLLOW    Report Status PENDING  Incomplete  Blood Culture ID Panel (Reflexed)     Status: Abnormal   Collection Time: 04/10/16  4:13 PM  Result Value Ref Range Status   Enterococcus species NOT DETECTED NOT DETECTED Final   Vancomycin resistance  NOT DETECTED NOT DETECTED Final   Listeria monocytogenes NOT DETECTED NOT DETECTED Final   Staphylococcus species NOT DETECTED NOT DETECTED Final   Staphylococcus aureus NOT DETECTED NOT DETECTED Final   Methicillin resistance NOT DETECTED NOT DETECTED Final   Streptococcus species NOT DETECTED NOT DETECTED Final   Streptococcus agalactiae NOT DETECTED NOT DETECTED Final   Streptococcus pneumoniae NOT DETECTED NOT DETECTED Final   Streptococcus pyogenes NOT DETECTED NOT DETECTED Final   Acinetobacter baumannii NOT DETECTED NOT DETECTED Final   Enterobacteriaceae species DETECTED (A) NOT DETECTED Final    Comment: CRITICAL RESULT CALLED TO, READ BACK BY AND VERIFIED WITH: CHRISTINA KATSOUDAS ON 04/11/16 AT 0901 BY QSD    Enterobacter cloacae complex NOT DETECTED NOT DETECTED Final   Escherichia coli DETECTED (A) NOT DETECTED Final    Comment: CRITICAL RESULT CALLED TO, READ BACK BY AND VERIFIED WITH: CHRISTINA KATSOUDAS ON 04/11/16 AT 0901 BY QSD    Klebsiella oxytoca NOT DETECTED NOT DETECTED Final   Klebsiella pneumoniae NOT DETECTED NOT DETECTED Final   Proteus species NOT DETECTED NOT DETECTED Final   Serratia marcescens NOT DETECTED NOT DETECTED Final   Carbapenem resistance NOT DETECTED NOT DETECTED Final   Haemophilus influenzae NOT DETECTED NOT DETECTED Final   Neisseria meningitidis NOT DETECTED NOT DETECTED Final   Pseudomonas aeruginosa NOT DETECTED NOT DETECTED Final   Candida albicans NOT DETECTED NOT DETECTED Final   Candida glabrata NOT DETECTED NOT DETECTED Final   Candida krusei NOT DETECTED NOT DETECTED Final   Candida parapsilosis NOT DETECTED NOT DETECTED Final   Candida tropicalis NOT DETECTED NOT DETECTED Final  Blood culture (routine x 2)     Status: None (Preliminary result)   Collection Time: 04/10/16  4:14 PM  Result Value Ref Range Status   Specimen Description BLOOD LEFT ASSIST CONTROL  Final   Special Requests BOTTLES DRAWN AEROBIC AND ANAEROBIC  [place]  Final   Culture NO GROWTH 2 DAYS  Final   Report Status PENDING  Incomplete  Urine culture     Status: None (Preliminary result)   Collection Time: 04/11/16  9:35 AM  Result Value Ref Range Status   Specimen Description URINE, RANDOM  Final   Special Requests NONE  Final   Culture NO GROWTH < 24 HOURS  Final   Report Status PENDING  Incomplete     Studies: Ct Abdomen Pelvis W Contrast  04/10/2016  CLINICAL DATA:  Periumbilical pain which developed early this morning with nausea and vomiting. Metastatic pancreatic cancer. EXAM: CT ABDOMEN AND PELVIS WITH CONTRAST TECHNIQUE: Multidetector CT imaging of the abdomen and pelvis was performed using the standard protocol following bolus administration of intravenous contrast. CONTRAST:  152mL ISOVUE-300 IOPAMIDOL (ISOVUE-300) INJECTION 61% COMPARISON:  Abdominal  CT dated 02/14/2016 and chest CT dated 03/28/2016 and PET-CT scan dated 02/15/2016 FINDINGS: Lower chest: New small bilateral pleural effusions. Stable 5 mm nodule in the right middle lobe peripherally. Subtle new peripheral edema at the lung bases. The lungs are hyperinflated consistent emphysema. Hepatobiliary: Numerous metastatic lesions in the liver have progressed. There is new periportal edema. Biliary stent in place with air in the biliary tree. Pancreas: Pancreatic mass measures 3.3 x 2.5 cm, increased from 3.2 x 2.3 cm. Increased atrophy of the pancreatic tail with increased dilatation of the distal pancreatic duct. Spleen: There are 2 new linear areas of abnormal low density in the periphery of the superior aspect of the spleen consistent with splenic lacerations. The configuration is not suggestive of metastatic disease. There is no subcapsular hemorrhage. Adrenals/Urinary Tract: The adrenal glands and right kidney are normal. Benign 14 mm cyst in the upper pole of the left kidney and benign 23 mm cyst in the lower pole of the left kidney. No hydronephrosis. Bladder appears  normal. Stomach/Bowel: Extensive diverticulosis of the left side of the colon. Diffuse edema of the colonic mucosa. Small bowel appears normal. Peripheral lacerations in the upper pole of the spleen without subcapsular hematoma. Vascular/Lymphatic: The left external iliac vein is enlarged as is the left common femoral vein and there appear to be thrombi in these vessels. This is not not definitive since mixing of unenhanced blood with enhance blood could give this appearance although the distention of the vein would be atypical and is new since the prior study. Aortic atherosclerosis. Reproductive: Normal. Other: Extensive new ascites since the CT scan of 02/14/2016. Increased ascites since the CT scan of 03/28/2016. Musculoskeletal: Multilevel degenerative facet arthritis in the lumbar spine. No acute abnormality. No visible bone metastases. IMPRESSION: 1. New small bilateral pleural effusions and subtle pulmonary edema. 2. Progressive metastatic disease in the liver. 3. Progressive enlargement of the pancreatic cancer. 4. New splenic lacerations without a subcapsular hematoma or visible acute hemorrhage. 5. New probable venous thrombosis of the left external iliac vein and left common femoral vein. 6. Aortic atherosclerosis. 7. New extensive ascites since 02/14/2016, progressed since 03/28/2016 Electronically Signed   By: Lorriane Shire M.D.   On: 04/10/2016 16:01   Dg Chest Port 1 View  04/10/2016  CLINICAL DATA:  LEFT lower quadrant pain EXAM: PORTABLE CHEST 1 VIEW COMPARISON:  CT chest 03/28/2016 FINDINGS: Normal cardiac silhouette. Mild nodular airspace disease in the RIGHT lung again demonstrated. Exam is lordotic. Low lung volumes. No pulmonary edema or pneumothorax IMPRESSION: Nodular airspace disease in the RIGHT lung not changed from prior. No change from CT of 03/28/2016 Electronically Signed   By: Suzy Bouchard M.D.   On: 04/10/2016 16:26    Scheduled Meds: . atorvastatin  80 mg Oral q1800  .  feeding supplement (ENSURE ENLIVE)  237 mL Oral TID BM  . megestrol  200 mg Oral Daily  . methimazole  5 mg Oral Daily  . piperacillin-tazobactam (ZOSYN)  IV  3.375 g Intravenous Q8H  . tamsulosin  0.4 mg Oral QPC breakfast   Continuous Infusions: . sodium chloride 50 mL/hr at 04/12/16 0045  . heparin 1,350 Units/hr (04/11/16 2157)    Assessment/Plan:  1. Clinical sepsis with Escherichia coli likely related to metastatic pancreatic cancer Identification of susceptibilities currently pending 2. Metastatic pancreatic cancer to liver, ascites. Prognosis very poor again I reiterated this with the family they want to discuss with oncology. Patient has significant progression of his disease compared with  CT scan done in March. She has significant ascites which needs to be drainedI will asked radiology to do a therapeutic and diagnostic paracentesis 3. Acute kidney injury and dehydration.due to ascites will stop IV fluids. 4. Hyponatremia likely secondary to poor intake 5. New venous thrombosis left external iliac and left common femoral vein.ontinue heparin will need Lovenox per oncology 6. Vomiting brownish material.Related to #2 supportive care 7. New splenic lacerations. Monitor closely  Code Status:     Code Status Orders        Start     Ordered   04/10/16 2002  Full code   Continuous     04/10/16 2001    Code Status History    Date Active Date Inactive Code Status Order ID Comments User Context   04/04/2016 11:59 AM 04/06/2016  5:01 PM Full Code [id number]  Lytle Butte, MD ED   02/09/2016 12:32 AM 02/11/2016  4:52 PM Full Code [id number]  Fritzi Mandes, MD Inpatient    Advance Directive Documentation        Most Recent Value   Type of Advance Directive  Healthcare Power of [place], Living will [POA: Keshon Worrell (spouse)]   Pre-existing out of facility DNR order (yellow form or pink MOST form)     "MOST" Form in Place?       Family Communication: Spoke with wife on the  phone Disposition Plan: To be determined  Consultants:  Oncology  Antibiotics:  Zosyn  Time spent: 25 minutes  [place], [facility]

## 2016-04-12 NOTE — Procedures (Signed)
US guided paracentesis performed.  Puncture site in RLQ.  3 liters of amber colored fluid removed without complication.

## 2016-04-12 NOTE — Progress Notes (Signed)
ANTICOAGULATION CONSULT NOTE - FOLLOW UP  Pharmacy Consult for Heparin Indication: DVT  Allergies  Allergen Reactions  . Lodine [Etodolac] Nausea And Vomiting    Patient Measurements: Height: 5\' 11"  (180.3 cm) Weight: 169 lb 9.6 oz (76.93 kg) IBW/kg (Calculated) : 75.3 Heparin Dosing Weight: 71.2 kg  Vital Signs: Temp: 97.5 F (36.4 C) (05/05 1331) Temp Source: Oral (05/05 1331) BP: 108/59 mmHg (05/05 1459) Pulse Rate: 109 (05/05 1459)  Labs:  Recent Labs  04/10/16 1356 04/10/16 1613  04/10/16 2030 04/11/16 0408  04/11/16 2005 04/12/16 0558 04/12/16 1233 04/12/16 1526  HGB 11.4*  --   --   --  9.5*  --   --  8.8*  --   --   HCT 35.4*  --   --   --  29.0*  --   --  26.6*  --   --   PLT 295  --   --   --  185  --   --  189  --   --   APTT  --   --   < > 38* 84*  < > 59* 95*  --  55*  LABPROT  --   --   --  23.4*  --   --   --   --  17.7*  --   INR  --   --   --  2.10  --   --   --   --  1.45  --   HEPARINUNFRC  --   --   --  2.00*  --   --   --  1.65*  --  1.53*  CREATININE 1.28*  --   --   --  1.31*  --   --  1.16  --   --   TROPONINI  --  <0.03  --   --   --   --   --   --   --   --   < > = values in this interval not displayed.  Estimated Creatinine Clearance: 54.1 mL/min (by C-G formula based on Cr of 1.16).   Medical History: Past Medical History  Diagnosis Date  . TIA (transient ischemic attack)   . Hypertension   . Diverticulitis   . Cancer ([place])     pancreatic ca  . GERD (gastroesophageal reflux disease)   . History of hiatal hernia   . History of colonic polyps       Assessment: 82 yo male on Xarelto 15 mg PO BID PTA for recent left leg DVT now with a new probable DVT. Currently dosing off of aPTT since patient was on rivaroxaban 15 mg PO BID prior to admission.   Baseline labs:  hgb 11.4, plt 295, INR 2.1, aPTT 38, HL 2.00   APTT of 55 is subtherapeutic on 1350 units/hr. Spoke with RN and patient did have an interruption in heparin drip of  ~45 minutes earlier today while in a procedure. Will increase heparin infusion since patient is acutely being treated for DVT.  Goal of Therapy:  Heparin level 0.3-0.7 units/ml Monitor platelets by anticoagulation protocol: Yes  aPTT goal 68-109 until following by heparin levels.   Plan:  No heparin bolus Will start heparin at 1150 units/hr - will start now since it is about 24h since last dose. Will follow and adjust by aPTT for now since anti-Xa elevated due to Xarelto. APTT ordered for 6 hours after start of heparin infusion. CBC in AM  5/4 AM  aPTT 84. Continue current regimen. Recheck aPTT, heparin level and CBC tomorrow AM.  5/4 @ 12:00 APTT was 79. Continue current rate of heparin gtt. Order APTT for 20:00.   5/4 :  APTT @ 20:00 = 59.  Will increase Heparin drip rate and recheck APTT 8 hrs after rate change. ''  5/5 AM aPTT 95. Continue current regimen. Recheck APTT @ 1400.   5/5 aPTT @ 1526 was 55. Will increase heparin drip to 1500 units/hr and order another aPTT and heparin level in 8 hours. Once heparin level and aPTT correlate, will start dosing off of heparin levels.   Lenis Noon, PharmD Clinical Pharmacist 04/12/2016,5:43 PM

## 2016-04-12 NOTE — Progress Notes (Signed)
Initial Nutrition Assessment  DOCUMENTATION CODES:   Severe malnutrition in context of chronic illness  INTERVENTION:   -Cater to pt preferences on [place] diet order, will send strawberry yogurt -Will send plastic utensils on meal trays -Agree with Ensure Enlive po BID, each supplement provides 350 kcal and 20 grams of protein; will increase to TID.  Pt prefers Ensure made with ice cream into a milkshake. -Will also send Magic Cup BID for added nutrition as pt tolerating some ice cream on visit -Await diet advancement as medically able   NUTRITION DIAGNOSIS:   Malnutrition related to chronic illness as evidenced by energy intake < or equal to 75% for > or equal to 1 month, severe depletion of body fat, severe depletion of muscle mass, percent weight loss.  GOAL:   Patient will meet greater than or equal to 90% of their needs  MONITOR:   PO intake, Supplement acceptance, Diet advancement, Labs, I & O's, Weight trends  REASON FOR ASSESSMENT:   Malnutrition Screening Tool    ASSESSMENT:   Pt with h/o TIAs and pancreatic cancer on chemotherapy admitted with abdominal pain and n/v brown emesis, admitted with sepsis.  Pt with recent admission last week with acute CVA.  Past Medical History  Diagnosis Date  . TIA (transient ischemic attack)   . Hypertension   . Diverticulitis   . Cancer ([place])     pancreatic ca  . GERD (gastroesophageal reflux disease)   . History of hiatal hernia   . History of colonic polyps     Diet Order:  Diet full liquid Room service appropriate?: Yes; Fluid consistency:: Thin  Pt had eaten a few bites of ice cream this morning on visit.   RD aided pt in taking a few sips of Ensure this morning on visit.  Pt reports appetite has continued to be poor since last admission, one week ago. Pt taking bites and sips at best. On last admission, pt reported to writer that poor po intake has been present since before his pancreatic cancer diagnosis in March,  trying to eat small frequent meals when able but usually small amounts. Pt drinks ensure made with ice cream when able.  Medications: Megace, NS at 71mL/hr Labs: Na 134, Glucose 142, Albumin 1.2, elevated WBC   Gastrointestinal Profile: Last BM:  04/09/2016, abdomen distended on visit RD notes per H&P possible malignant ascites   Nutrition-Focused Physical Exam Findings: Nutrition-Focused physical exam completed. Findings are moderate-severe fat depletion, severe muscle depletion, and no edema.     Weight Change: Pt reports weight loss of 30lbs in the past 3 months. Pt daughter reports pt weight this morning of 166lbs however she felt that was too high that pt weighing about 10lbs less.  Per weight trend pt weight 155bs last admission. (16% weight loss in 3 months).  RD notes abdominal distension.    Skin:  Reviewed, no issues   Height:   Ht Readings from Last 1 Encounters:  04/12/16 5\' 11"  (1.803 m)    Weight:   Wt Readings from Last 1 Encounters:  04/12/16 169 lb 9.6 oz (76.93 kg)   Wt Readings from Last 10 Encounters:  04/12/16 169 lb 9.6 oz (76.93 kg)  04/04/16 155 lb (70.308 kg)  03/29/16 158 lb 1.1 oz (71.7 kg)  03/28/16 156 lb 15.5 oz (71.2 kg)  03/21/16 160 lb 0.9 oz (72.6 kg)  03/14/16 158 lb 15.2 oz (72.1 kg)  03/13/16 158 lb (71.668 kg)  03/07/16 160 lb 2.6 oz (  72.65 kg)  02/27/16 170 lb (77.111 kg)  02/24/16 170 lb (77.111 kg)    BMI:  Body mass index is 23.66 kg/(m^2).  Estimated Nutritional Needs:   Kcal:  1888-2230kcals  Protein:  77-91g protein  Fluid:  >2L fluid  EDUCATION NEEDS:   No education needs identified at this time  Dwyane Luo, RD, LDN Pager [phone] Weekend/On-Call Pager [phone]

## 2016-04-13 ENCOUNTER — Inpatient Hospital Stay: Payer: Medicare Other

## 2016-04-13 DIAGNOSIS — C259 Malignant neoplasm of pancreas, unspecified: Secondary | ICD-10-CM

## 2016-04-13 DIAGNOSIS — A4151 Sepsis due to Escherichia coli [E. coli]: Principal | ICD-10-CM

## 2016-04-13 DIAGNOSIS — R0602 Shortness of breath: Secondary | ICD-10-CM

## 2016-04-13 DIAGNOSIS — R6520 Severe sepsis without septic shock: Secondary | ICD-10-CM

## 2016-04-13 DIAGNOSIS — R109 Unspecified abdominal pain: Secondary | ICD-10-CM

## 2016-04-13 LAB — CBC
HEMATOCRIT: 26.3 % — AB (ref 40.0–52.0)
HEMOGLOBIN: 8.5 g/dL — AB (ref 13.0–18.0)
MCH: 29.8 pg (ref 26.0–34.0)
MCHC: 32.3 g/dL (ref 32.0–36.0)
MCV: 92.4 fL (ref 80.0–100.0)
Platelets: 190 10*3/uL (ref 150–440)
RBC: 2.85 MIL/uL — ABNORMAL LOW (ref 4.40–5.90)
RDW: 17.6 % — AB (ref 11.5–14.5)
WBC: 16 10*3/uL — ABNORMAL HIGH (ref 3.8–10.6)

## 2016-04-13 LAB — BASIC METABOLIC PANEL
ANION GAP: 10 (ref 5–15)
BUN: 39 mg/dL — ABNORMAL HIGH (ref 6–20)
CALCIUM: 7.7 mg/dL — AB (ref 8.9–10.3)
CO2: 21 mmol/L — AB (ref 22–32)
Chloride: 105 mmol/L (ref 101–111)
Creatinine, Ser: 1.27 mg/dL — ABNORMAL HIGH (ref 0.61–1.24)
GFR calc non Af Amer: 52 mL/min — ABNORMAL LOW (ref >60)
GFR, EST AFRICAN AMERICAN: 60 mL/min — AB (ref >60)
Glucose, Bld: 107 mg/dL — ABNORMAL HIGH (ref 65–99)
Potassium: 3.6 mmol/L (ref 3.5–5.1)
SODIUM: 136 mmol/L (ref 135–145)

## 2016-04-13 LAB — URINE CULTURE: CULTURE: NO GROWTH

## 2016-04-13 LAB — MISC LABCORP TEST (SEND OUT): Labcorp test code: 19588

## 2016-04-13 LAB — APTT: aPTT: 123 seconds — ABNORMAL HIGH (ref 24–36)

## 2016-04-13 LAB — HEPARIN LEVEL (UNFRACTIONATED): HEPARIN UNFRACTIONATED: 0.73 [IU]/mL — AB (ref 0.30–0.70)

## 2016-04-13 MED ORDER — FENTANYL 12 MCG/HR TD PT72
12.5000 ug | MEDICATED_PATCH | TRANSDERMAL | Status: DC
  Administered 2016-04-13 – 2016-04-16 (×2): 12.5 ug via TRANSDERMAL
  Filled 2016-04-13 (×2): qty 1

## 2016-04-13 MED ORDER — ENOXAPARIN SODIUM 120 MG/0.8ML ~~LOC~~ SOLN
1.5000 mg/kg | SUBCUTANEOUS | Status: DC
  Administered 2016-04-13 – 2016-04-16 (×4): 110 mg via SUBCUTANEOUS
  Filled 2016-04-13 (×4): qty 0.8

## 2016-04-13 MED ORDER — ENOXAPARIN SODIUM 120 MG/0.8ML ~~LOC~~ SOLN
1.5000 mg/kg | SUBCUTANEOUS | Status: DC
  Filled 2016-04-13: qty 0.8

## 2016-04-13 MED ORDER — FENTANYL 50 MCG/HR TD PT72: 50.0000 ug | MEDICATED_PATCH | TRANSDERMAL | Status: DC

## 2016-04-13 MED ORDER — MORPHINE SULFATE (PF) 2 MG/ML IV SOLN: 2.0000 mg | INTRAVENOUS | Status: DC | PRN

## 2016-04-13 MED ORDER — SCOPOLAMINE 1 MG/3DAYS TD PT72
1.0000 | MEDICATED_PATCH | TRANSDERMAL | Status: DC
  Administered 2016-04-13: 21:00:00 1.5 mg via TRANSDERMAL
  Filled 2016-04-13 (×2): qty 1

## 2016-04-13 MED ORDER — DEXTROSE 5 % IV SOLN
2.0000 g | INTRAVENOUS | Status: DC
  Administered 2016-04-13 – 2016-04-15 (×3): 2 g via INTRAVENOUS
  Filled 2016-04-13 (×4): qty 2

## 2016-04-13 MED ORDER — DOCUSATE SODIUM 100 MG PO CAPS
100.0000 mg | ORAL_CAPSULE | Freq: Two times a day (BID) | ORAL | Status: DC
  Administered 2016-04-13 – 2016-04-16 (×7): 100 mg via ORAL
  Filled 2016-04-13 (×7): qty 1

## 2016-04-13 NOTE — Progress Notes (Signed)
Patient ID: Cody Andrade, male   DOB: [birth year], 82 y.o.   MRN: [id number] [facility] PROGRESS NOTE  Cody Andrade [id number] DOB: 1933/09/05 DOA: 04/10/2016 PCP: No PCP Per Patient  HPI/Subjective: Patient continues to have significant abdominal pain per nurse, not able to eat much Had paracentesis yesterday 3 L of fluid out. His breathing is improved. Objective: Filed Vitals:   04/12/16 2122 04/13/16 0503  BP: 111/63 117/58  Pulse: 114 102  Temp: 98.5 F (36.9 C) 98.4 F (36.9 C)  Resp: 17 18    Filed Weights   04/12/16 0437 04/12/16 1000 04/13/16 0500  Weight: 78.79 kg (173 lb 11.2 oz) 76.93 kg (169 lb 9.6 oz) 74.208 kg (163 lb 9.6 oz)    ROS: Review of Systems  Constitutional: Negative for fever and chills.  Eyes: Negative for blurred vision.  Respiratory: Negative for cough and positiveshortness of breath.   Cardiovascular: Negative for chest pain.  Gastrointestinal: Positive for abdominal pain,  distention improved. Negative for nausea, vomiting, diarrhea and constipation.  Genitourinary: Negative for dysuria.  Musculoskeletal: Negative for joint pain.  Neurological: Negative for dizziness and headaches.   Exam: Physical Exam  Constitutional: He is oriented to person, place, and time.  HENT:  Nose: No mucosal edema.  Mouth/Throat: No oropharyngeal exudate or posterior oropharyngeal edema.  Eyes: Conjunctivae, EOM and lids are normal. Pupils are equal, round, and reactive to light.  Neck: No JVD present. Carotid bruit is not present. No edema present. No thyroid mass and no thyromegaly present.  Cardiovascular: S1 normal and S2 normal.  Tachycardia present.  Exam reveals no gallop.   Murmur heard.  Systolic murmur is present with a grade of 2/6  Pulses:      Dorsalis pedis pulses are 2+ on the right side, and 2+ on the left side.  Respiratory: No respiratory distress. He has no wheezes. He has no rhonchi. He has no rales.  GI: Soft. Bowel sounds are  normal. There is generalized tenderness.  Musculoskeletal:       Right ankle: He exhibits no swelling.       Left ankle: He exhibits no swelling.  Lymphadenopathy:    He has no cervical adenopathy.  Neurological: He is alert and oriented to person, place, and time. No cranial nerve deficit.  Skin: Skin is warm. No rash noted. Nails show no clubbing.  Psychiatric: He has a normal mood and affect.      Data Reviewed: Basic Metabolic Panel:  Recent Labs Lab 04/10/16 1356 04/11/16 0408 04/12/16 0558 04/13/16 0538  NA 132* 131* 134* 136  K 3.6 4.0 3.8 3.6  CL 98* 102 105 105  CO2 19* 18* 20* 21*  GLUCOSE 132* 150* 142* 107*  BUN 30* 34* 39* 39*  CREATININE 1.28* 1.31* 1.16 1.27*  CALCIUM 8.3* 7.6* 7.6* 7.7*   Liver Function Tests:  Recent Labs Lab 04/10/16 1613 04/12/16 1526  AST 29  --   ALT 30  --   ALKPHOS 245*  --   BILITOT 1.0  --   PROT <3.0* 5.3*  ALBUMIN 1.2*  --     Recent Labs Lab 04/10/16 1613  LIPASE 13   CBC:  Recent Labs Lab 04/10/16 1356 04/11/16 0408 04/12/16 0558  WBC 10.7* 14.0* 16.3*  NEUTROABS 9.7*  --   --   HGB 11.4* 9.5* 8.8*  HCT 35.4* 29.0* 26.6*  MCV 94.1 94.5 92.4  PLT 295 185 189   Cardiac Enzymes:  Recent Labs Lab 04/10/16  [place] <0.03     Recent Results (from the past 240 hour(s))  Blood culture (routine x 2)     Status: Abnormal (Preliminary result)   Collection Time: 04/10/16  4:13 PM  Result Value Ref Range Status   Specimen Description BLOOD RIGHT FATTY CASTS  Final   Special Requests BOTTLES DRAWN AEROBIC AND ANAEROBIC 2CCAERO,1CCANA  Final   Culture  Setup Time   Final    GRAM NEGATIVE RODS AEROBIC BOTTLE ONLY CRITICAL RESULT CALLED TO, READ BACK BY AND VERIFIED WITH: CHRISTINA KATSOUDAS ON 04/11/16 AT 0901 BY QSD    Culture ESCHERICHIA COLI AEROBIC BOTTLE ONLY  (A)  Final   Report Status PENDING  Incomplete   Organism ID, Bacteria ESCHERICHIA COLI  Final      Susceptibility   Escherichia  coli - MIC*    AMPICILLIN >=32 RESISTANT Resistant     CEFAZOLIN >=64 RESISTANT Resistant     CEFEPIME <=1 SENSITIVE Sensitive     CEFTAZIDIME <=1 SENSITIVE Sensitive     CEFTRIAXONE <=1 SENSITIVE Sensitive     CIPROFLOXACIN >=4 RESISTANT Resistant     GENTAMICIN <=1 SENSITIVE Sensitive     IMIPENEM <=0.25 SENSITIVE Sensitive     TRIMETH/SULFA <=20 SENSITIVE Sensitive     AMPICILLIN/SULBACTAM >=32 RESISTANT Resistant     PIP/TAZO 64 INTERMEDIATE Intermediate     Extended ESBL NEGATIVE Sensitive     * ESCHERICHIA COLI  Blood Culture ID Panel (Reflexed)     Status: Abnormal   Collection Time: 04/10/16  4:13 PM  Result Value Ref Range Status   Enterococcus species NOT DETECTED NOT DETECTED Final   Vancomycin resistance NOT DETECTED NOT DETECTED Final   Listeria monocytogenes NOT DETECTED NOT DETECTED Final   Staphylococcus species NOT DETECTED NOT DETECTED Final   Staphylococcus aureus NOT DETECTED NOT DETECTED Final   Methicillin resistance NOT DETECTED NOT DETECTED Final   Streptococcus species NOT DETECTED NOT DETECTED Final   Streptococcus agalactiae NOT DETECTED NOT DETECTED Final   Streptococcus pneumoniae NOT DETECTED NOT DETECTED Final   Streptococcus pyogenes NOT DETECTED NOT DETECTED Final   Acinetobacter baumannii NOT DETECTED NOT DETECTED Final   Enterobacteriaceae species DETECTED (A) NOT DETECTED Final    Comment: CRITICAL RESULT CALLED TO, READ BACK BY AND VERIFIED WITH: CHRISTINA KATSOUDAS ON 04/11/16 AT 0901 BY QSD    Enterobacter cloacae complex NOT DETECTED NOT DETECTED Final   Escherichia coli DETECTED (A) NOT DETECTED Final    Comment: CRITICAL RESULT CALLED TO, READ BACK BY AND VERIFIED WITH: CHRISTINA KATSOUDAS ON 04/11/16 AT 0901 BY QSD    Klebsiella oxytoca NOT DETECTED NOT DETECTED Final   Klebsiella pneumoniae NOT DETECTED NOT DETECTED Final   Proteus species NOT DETECTED NOT DETECTED Final   Serratia marcescens NOT DETECTED NOT DETECTED Final   Carbapenem  resistance NOT DETECTED NOT DETECTED Final   Haemophilus influenzae NOT DETECTED NOT DETECTED Final   Neisseria meningitidis NOT DETECTED NOT DETECTED Final   Pseudomonas aeruginosa NOT DETECTED NOT DETECTED Final   Candida albicans NOT DETECTED NOT DETECTED Final   Candida glabrata NOT DETECTED NOT DETECTED Final   Candida krusei NOT DETECTED NOT DETECTED Final   Candida parapsilosis NOT DETECTED NOT DETECTED Final   Candida tropicalis NOT DETECTED NOT DETECTED Final  Blood culture (routine x 2)     Status: None (Preliminary result)   Collection Time: 04/10/16  4:14 PM  Result Value Ref Range Status   Specimen Description BLOOD LEFT ASSIST CONTROL  Final  Special Requests BOTTLES DRAWN AEROBIC AND ANAEROBIC [place]  Final   Culture NO GROWTH 2 DAYS  Final   Report Status PENDING  Incomplete  Urine culture     Status: None   Collection Time: 04/11/16  9:35 AM  Result Value Ref Range Status   Specimen Description URINE, RANDOM  Final   Special Requests NONE  Final   Culture NO GROWTH 2 DAYS  Final   Report Status 04/13/2016 FINAL  Final     Studies: Dg Chest 2 View  04/13/2016  CLINICAL DATA:  Shortness of breath EXAM: CHEST  2 VIEW COMPARISON:  04/10/2016 FINDINGS: Stable positioning of left subclavian porta catheter. Layering pleural effusions and basilar atelectasis, as seen on 04/10/2016 abdominal CT. Hyperinflation attributed to COPD. Normal heart size. IMPRESSION: Small bilateral pleural effusion with basilar atelectasis. COPD Electronically Signed   By: Monte Fantasia M.D.   On: 04/13/2016 08:17   US Paracentesis  04/12/2016  INDICATION: Metastatic pancreatic cancer with sepsis. Request for diagnostic and therapeutic paracentesis. EXAM: ULTRASOUND GUIDED PARACENTESIS MEDICATIONS: None. COMPLICATIONS: None immediate. PROCEDURE: Informed written consent was obtained from the patient after a discussion of the risks, benefits and alternatives to treatment. A timeout was  performed prior to the initiation of the procedure. Initial ultrasound scanning demonstrates a large amount of ascites within the right lower abdominal quadrant. The right lower abdomen was prepped and draped in the usual sterile fashion. 1% lidocaine with epinephrine was used for local anesthesia. Following this, a 6 Fr Safe-T-Centesis catheter was introduced. An ultrasound image was saved for documentation purposes. The paracentesis was performed. The catheter was removed and a dressing was applied. The patient tolerated the procedure well without immediate post procedural complication. FINDINGS: A total of approximately 3 L of amber colored fluid was removed. Samples were sent to the laboratory as requested by the clinical team. IMPRESSION: Successful ultrasound-guided paracentesis yielding 3 L liters of peritoneal fluid. Electronically Signed   By: Markus Daft M.D.   On: 04/12/2016 15:25    Scheduled Meds: . atorvastatin  80 mg Oral q1800  . cefTRIAXone (ROCEPHIN)  IV  2 g Intravenous Q24H  . docusate sodium  100 mg Oral BID  . enoxaparin (LOVENOX) injection  1.5 mg/kg Subcutaneous Q24H  . feeding supplement (ENSURE ENLIVE)  237 mL Oral TID BM  . megestrol  200 mg Oral Daily  . methimazole  5 mg Oral Daily  . tamsulosin  0.4 mg Oral QPC breakfast   Continuous Infusions:    Assessment/Plan:  1. Clinical sepsis with Escherichia coli elated to metastatic pancreatic cancer Patient susceptibilities are back he is changing over to ceftriaxone which is sensitive to 2. Metastatic pancreatic cancer to liver, ascites.  Patient with progressive pain Oncology saw the patient and discussed it with them as well as I discussed with the patient and family regarding aggressive nature of this cancer and progression.  Patient still wants to be a full code Unfortunately this is not a curable cancer. 3. Acute kidney injury resolved  4. Hyponatremia likely secondary to poor intake 5. New venous thrombosis  left external iliac and left common femoral vein. I will stop IV heparin start Lovenox 6. Vomiting brownish material.Related to #2 supportive care 7. New splenic lacerations. Monitor closely  Code Status:     Code Status Orders        Start     Ordered   04/10/16 2002  Full code   Continuous     04/10/16 2001  Code Status History    Date Active Date Inactive Code Status Order ID Comments User Context   04/04/2016 11:59 AM 04/06/2016  5:01 PM Full Code [id number]  Lytle Butte, MD ED   02/09/2016 12:32 AM 02/11/2016  4:52 PM Full Code [id number]  Fritzi Mandes, MD Inpatient    Advance Directive Documentation        Most Recent Value   Type of Advance Directive  Healthcare Power of [place], Living will [POA: Jhaylen Tischer (spouse)]   Pre-existing out of facility DNR order (yellow form or pink MOST form)     "MOST" Form in Place?       Family Communication: Discussed with the patient Disposition Plan: To be determined  Consultants:  Oncology  Antibiotics:  Zosyn  Time spent: 25 minutes  [place], [facility]

## 2016-04-13 NOTE — Progress Notes (Signed)
Order entered for fentanyl patch 50 mcg/hr q72 hrs. Called MD to express concern for respiratory depression given non-extensive narcotic use. MD would like to continue with order for fentanyl patch at a lower dose.  Darylene Price [facility] PharmD 04/13/2016 2:30 PM

## 2016-04-13 NOTE — Progress Notes (Signed)
ANTICOAGULATION CONSULT NOTE - FOLLOW UP  Pharmacy Consult for Heparin Indication: DVT  Allergies  Allergen Reactions  . Lodine [Etodolac] Nausea And Vomiting    Patient Measurements: Height: 5\' 11"  (180.3 cm) Weight: 169 lb 9.6 oz (76.93 kg) IBW/kg (Calculated) : 75.3 Heparin Dosing Weight: 71.2 kg  Vital Signs: Temp: 98.5 F (36.9 C) (05/05 2122) Temp Source: Oral (05/05 2122) BP: 111/63 mmHg (05/05 2122) Pulse Rate: 114 (05/05 2122)  Labs:  Recent Labs  04/10/16 1356 04/10/16 1613  04/10/16 2030 04/11/16 0408  04/12/16 0558 04/12/16 1233 04/12/16 1526 04/13/16 0218  HGB 11.4*  --   --   --  9.5*  --  8.8*  --   --   --   HCT 35.4*  --   --   --  29.0*  --  26.6*  --   --   --   PLT 295  --   --   --  185  --  189  --   --   --   APTT  --   --   < > 38* 84*  < > 95*  --  55* 123*  LABPROT  --   --   --  23.4*  --   --   --  17.7*  --   --   INR  --   --   --  2.10  --   --   --  1.45  --   --   HEPARINUNFRC  --   --   < > 2.00*  --   --  1.65*  --  1.53* 0.73*  CREATININE 1.28*  --   --   --  1.31*  --  1.16  --   --   --   TROPONINI  --  <0.03  --   --   --   --   --   --   --   --   < > = values in this interval not displayed.  Estimated Creatinine Clearance: 54.1 mL/min (by C-G formula based on Cr of 1.16).   Medical History: Past Medical History  Diagnosis Date  . TIA (transient ischemic attack)   . Hypertension   . Diverticulitis   . Cancer ([place])     pancreatic ca  . GERD (gastroesophageal reflux disease)   . History of hiatal hernia   . History of colonic polyps       Assessment: 82 yo male on Xarelto 15 mg PO BID PTA for recent left leg DVT now with a new probable DVT. Currently dosing off of aPTT since patient was on rivaroxaban 15 mg PO BID prior to admission.   Baseline labs:  hgb 11.4, plt 295, INR 2.1, aPTT 38, HL 2.00   APTT of 55 is subtherapeutic on 1350 units/hr. Spoke with RN and patient did have an interruption in heparin drip  of ~45 minutes earlier today while in a procedure. Will increase heparin infusion since patient is acutely being treated for DVT.  Goal of Therapy:  Heparin level 0.3-0.7 units/ml Monitor platelets by anticoagulation protocol: Yes  aPTT goal 68-109 until following by heparin levels.   Plan:  No heparin bolus Will start heparin at 1150 units/hr - will start now since it is about 24h since last dose. Will follow and adjust by aPTT for now since anti-Xa elevated due to Xarelto. APTT ordered for 6 hours after start of heparin infusion. CBC in AM  5/4 AM aPTT 84.  Continue current regimen. Recheck aPTT, heparin level and CBC tomorrow AM.  5/4 @ 12:00 APTT was 79. Continue current rate of heparin gtt. Order APTT for 20:00.   5/4 :  APTT @ 20:00 = 59.  Will increase Heparin drip rate and recheck APTT 8 hrs after rate change. ''  5/5 AM aPTT 95. Continue current regimen. Recheck APTT @ 1400.   5/5 aPTT @ 1526 was 55. Will increase heparin drip to 1500 units/hr and order another aPTT and heparin level in 8 hours. Once heparin level and aPTT correlate, will start dosing off of heparin levels.   5/6 02:00 aPTT 123 and heparin level 0.73. Decreasing to 1400 units/hr and recheck aPTT and heparin level in 8 hours.  Eloise Harman, PharmD Clinical Pharmacist 04/13/2016,3:53 AM

## 2016-04-13 NOTE — Progress Notes (Signed)
[facility] Hematology/Oncology Progress Note  Date of admission: 04/10/2016  Hospital day:  04/13/2016  Chief Complaint: Cody Andrade is a 82 y.o. male with metastatic pancreatic cancer who was admitted with sepsis.  Subjective: Feels short of breath.  Abdominal distention improved after paracentesis.  Abdominal discomfort persistent.  Remains weak and fatigued.  Social History: The patient is accompanied by his 2 daughters, son-in-law and 2 grandchildren today.  Allergies:  Allergies  Allergen Reactions  . Lodine [Etodolac] Nausea And Vomiting    Scheduled Medications: . atorvastatin  80 mg Oral q1800  . cefTRIAXone (ROCEPHIN)  IV  2 g Intravenous Q24H  . docusate sodium  100 mg Oral BID  . enoxaparin (LOVENOX) injection  1.5 mg/kg Subcutaneous Q24H  . feeding supplement (ENSURE ENLIVE)  237 mL Oral TID BM  . fentaNYL  50 mcg Transdermal Q72H  . megestrol  200 mg Oral Daily  . methimazole  5 mg Oral Daily  . tamsulosin  0.4 mg Oral QPC breakfast    Review of Systems: GENERAL: General fatigue. No fevers or sweats. PERFORMANCE STATUS (ECOG): 3 HEENT: Seasonal allergies. No visual changes, sore throat, mouth sores or tenderness. Lungs: Shortness of breath.  No cough. No hemoptysis. Cardiac: No chest pain, palpitations, orthopnea, or PND. GI: Appetite modest. Nausea. Epigastric pain.Abdominal distention improved after paracentesis.  No diarrhea, constipation, melena or hematochezia. GU: No urgency, frequency, dysuria, or hematuria. Musculoskeletal: Back pain. No joint pain. No muscle tenderness. Extremities: No pain or swelling. Skin: No rashes or skin changes. Neuro: General weakness.No headache, numbness or weakness, balance or coordination issues. Endocrine: No diabetes, thyroid issues, hot flashes or night sweats. Psych: No anxiety. Pain: Upper abdominal pain. Review of systems: All other systems reviewed and found to be  negative  Physical Exam: Blood pressure 117/58, pulse 102, temperature 98.4 F (36.9 C), temperature source Oral, resp. rate 18, height _0  (1.803 m), weight 163 lb 9.6 oz (74.208 kg), SpO2 94 %.  GENERAL: Thin, chronically ill appearing gentleman sitting comfortably on the medical unit in no acute distress. MENTAL STATUS: Alert and oriented to person, place and time. HEAD: Pearline Cables hair. Normocephalic, atraumatic, face symmetric, no Cushingoid features. EYES: Glasses. Hazel eyes. Pupils equal round and reactive to light and accomodation. No conjunctivitis or scleral icterus. RESPIRATORY: Poor respiratory excursion.  Decreased breath sounds at the bases.  Clear to auscultation without rales, wheezes or rhonchi. CARDIOVASCULAR: Regular rate and rhythm without murmur, rub or gallop. ABDOMEN: Soft, slightly distended, tender in the epigastric region without guarding or rebound tenderness. No hepatosplenomegaly. No masses. SKIN: No rashes, ulcers or lesions. EXTREMITIES: Left lower extremity 2+ edema. Right 1+ ankle edema.  No skin discoloration or tenderness. LYMPH NODES: No palpable cervical, supraclavicular, axillary or inguinal adenopathy  NEUROLOGICAL: Unremarkable. PSYCH: Appropriate  Results for orders placed or performed during the hospital encounter of 04/10/16 (from the past 48 hour(s))  APTT     Status: Abnormal   Collection Time: 04/11/16  8:05 PM  Result Value Ref Range   aPTT 59 (H) 24 - 36 seconds    Comment:        IF BASELINE aPTT IS ELEVATED, SUGGEST PATIENT RISK ASSESSMENT BE USED TO DETERMINE APPROPRIATE ANTICOAGULANT THERAPY.   Heparin level (unfractionated)     Status: Abnormal   Collection Time: 04/12/16  5:58 AM  Result Value Ref Range   Heparin Unfractionated 1.65 (H) 0.30 - 0.70 IU/mL    Comment:  IF HEPARIN RESULTS ARE BELOW EXPECTED VALUES, AND PATIENT DOSAGE HAS BEEN CONFIRMED, SUGGEST FOLLOW UP TESTING OF ANTITHROMBIN III  LEVELS.   CBC     Status: Abnormal   Collection Time: 04/12/16  5:58 AM  Result Value Ref Range   WBC 16.3 (H) 3.8 - 10.6 K/uL   RBC 2.88 (L) 4.40 - 5.90 MIL/uL   Hemoglobin 8.8 (L) 13.0 - 18.0 g/dL   HCT 26.6 (L) 40.0 - 52.0 %   MCV 92.4 80.0 - 100.0 fL   MCH 30.7 26.0 - 34.0 pg   MCHC 33.2 32.0 - 36.0 g/dL   RDW 16.9 (H) 11.5 - 14.5 %   Platelets 189 150 - 440 K/uL  Basic metabolic panel     Status: Abnormal   Collection Time: 04/12/16  5:58 AM  Result Value Ref Range   Sodium 134 (L) 135 - 145 mmol/L   Potassium 3.8 3.5 - 5.1 mmol/L   Chloride 105 101 - 111 mmol/L   CO2 20 (L) 22 - 32 mmol/L   Glucose, Bld 142 (H) 65 - 99 mg/dL   BUN 39 (H) 6 - 20 mg/dL   Creatinine, Ser 1.16 0.61 - 1.24 mg/dL   Calcium 7.6 (L) 8.9 - 10.3 mg/dL   GFR calc non Af Amer 58 (L) >60 mL/min   GFR calc Af Amer >60 >60 mL/min    Comment: (NOTE) The eGFR has been calculated using the CKD EPI equation. This calculation has not been validated in all clinical situations. eGFR's persistently <60 mL/min signify possible Chronic Kidney Disease.    Anion gap 9 5 - 15  APTT     Status: Abnormal   Collection Time: 04/12/16  5:58 AM  Result Value Ref Range   aPTT 95 (H) 24 - 36 seconds    Comment:        IF BASELINE aPTT IS ELEVATED, SUGGEST PATIENT RISK ASSESSMENT BE USED TO DETERMINE APPROPRIATE ANTICOAGULANT THERAPY.   Protime-INR     Status: Abnormal   Collection Time: 04/12/16 12:33 PM  Result Value Ref Range   Prothrombin Time 17.7 (H) 11.4 - 15.0 seconds   INR 1.45   Protein, pleural or peritoneal fluid     Status: None   Collection Time: 04/12/16  2:25 PM  Result Value Ref Range   Total protein, fluid <3.0 g/dL    Comment: SPECIMEN SENT TO [facility] FOR FURTHER TESTIING (NOTE) No normal range established for this test Results should be evaluated in conjunction with serum values    Fluid Type-FTP Peritoneal   Body fluid cell count with differential     Status: Abnormal   Collection  Time: 04/12/16  2:25 PM  Result Value Ref Range   Fluid Type-FCT Body Fluid    Color, Fluid YELLOW YELLOW   Appearance, Fluid CLOUDY (A) CLEAR   WBC, Fluid 24770 cu mm   Neutrophil Count, Fluid 86 %   Lymphs, Fluid 10 %   Monocyte-Macrophage-Serous Fluid 4 %   Eos, Fluid 0 %   Other Cells, Fluid 0 %  Miscellaneous [facility] test (send-out)     Status: None   Collection Time: 04/12/16  2:25 PM  Result Value Ref Range   [facility] test code [id number]    [facility] test name FLUID TOTAL PROTEIN    Misc [facility] result COMMENT     Comment: (NOTE) Test Ordered: [id number] Protein, Body Fluid Protein, Body Fluid            1.2  g/dL     BN   ________________________________________________________ :  Peritoneal  :       Pleural          :   Synovial     : :______________:________________________:________________: :              : Transudate :  Exudate  :                : :______________:____________:___________:________________: :  Not Estab.  :   <3 g/dL  :  >3 g/dL  :    <2.5 g/dL   : :______________:____________:___________:________________: The method performance specifications have not been established for this test in body fluid. The test result should be integrated into the clinical context for interpretation. The method performance specifications have not been established for this test in body fluid.  The test result should be integrated into the clinical context for interpretation. Performed At: [facility] [place], [place] [id number] Lindon Romp MD [id number]   Synovial cell count + diff, w/ crystals     Status: Abnormal   Collection Time: 04/12/16  2:25 PM  Result Value Ref Range   Color, Synovial YELLOW YELLOW   Appearance-Synovial CLOUDY (A) CLEAR   Crystals, Fluid NONE SEEN    WBC, Synovial 24770 (H) 0 - 200 /cu mm   Neutrophil, Synovial 86 %   Lymphocytes-Synovial Fld 10 %   Monocyte-Macrophage-Synovial Fluid 4 %    Eosinophils-Synovial 0 %   Other Cells-SYN 0   APTT     Status: Abnormal   Collection Time: 04/12/16  3:26 PM  Result Value Ref Range   aPTT 55 (H) 24 - 36 seconds    Comment:        IF BASELINE aPTT IS ELEVATED, SUGGEST PATIENT RISK ASSESSMENT BE USED TO DETERMINE APPROPRIATE ANTICOAGULANT THERAPY.   Protein, total     Status: Abnormal   Collection Time: 04/12/16  3:26 PM  Result Value Ref Range   Total Protein 5.3 (L) 6.5 - 8.1 g/dL  Heparin level (unfractionated)     Status: Abnormal   Collection Time: 04/12/16  3:26 PM  Result Value Ref Range   Heparin Unfractionated 1.53 (H) 0.30 - 0.70 IU/mL    Comment:        IF HEPARIN RESULTS ARE BELOW EXPECTED VALUES, AND PATIENT DOSAGE HAS BEEN CONFIRMED, SUGGEST FOLLOW UP TESTING OF ANTITHROMBIN III LEVELS.   APTT     Status: Abnormal   Collection Time: 04/13/16  2:18 AM  Result Value Ref Range   aPTT 123 (H) 24 - 36 seconds    Comment:        IF BASELINE aPTT IS ELEVATED, SUGGEST PATIENT RISK ASSESSMENT BE USED TO DETERMINE APPROPRIATE ANTICOAGULANT THERAPY.   Heparin level (unfractionated)     Status: Abnormal   Collection Time: 04/13/16  2:18 AM  Result Value Ref Range   Heparin Unfractionated 0.73 (H) 0.30 - 0.70 IU/mL    Comment:        IF HEPARIN RESULTS ARE BELOW EXPECTED VALUES, AND PATIENT DOSAGE HAS BEEN CONFIRMED, SUGGEST FOLLOW UP TESTING OF ANTITHROMBIN III LEVELS.   Basic metabolic panel     Status: Abnormal   Collection Time: 04/13/16  5:38 AM  Result Value Ref Range   Sodium 136 135 - 145 mmol/L   Potassium 3.6 3.5 - 5.1 mmol/L   Chloride 105 101 - 111 mmol/L   CO2 21 (L) 22 - 32 mmol/L  Glucose, Bld 107 (H) 65 - 99 mg/dL   BUN 39 (H) 6 - 20 mg/dL   Creatinine, Ser 1.27 (H) 0.61 - 1.24 mg/dL   Calcium 7.7 (L) 8.9 - 10.3 mg/dL   GFR calc non Af Amer 52 (L) >60 mL/min   GFR calc Af Amer 60 (L) >60 mL/min    Comment: (NOTE) The eGFR has been calculated using the CKD EPI equation. This  calculation has not been validated in all clinical situations. eGFR's persistently <60 mL/min signify possible Chronic Kidney Disease.    Anion gap 10 5 - 15  CBC     Status: Abnormal   Collection Time: 04/13/16 12:02 PM  Result Value Ref Range   WBC 16.0 (H) 3.8 - 10.6 K/uL   RBC 2.85 (L) 4.40 - 5.90 MIL/uL   Hemoglobin 8.5 (L) 13.0 - 18.0 g/dL   HCT 26.3 (L) 40.0 - 52.0 %   MCV 92.4 80.0 - 100.0 fL   MCH 29.8 26.0 - 34.0 pg   MCHC 32.3 32.0 - 36.0 g/dL   RDW 17.6 (H) 11.5 - 14.5 %   Platelets 190 150 - 440 K/uL   Dg Chest 2 View  04/13/2016  CLINICAL DATA:  Shortness of breath EXAM: CHEST  2 VIEW COMPARISON:  04/10/2016 FINDINGS: Stable positioning of left subclavian porta catheter. Layering pleural effusions and basilar atelectasis, as seen on 04/10/2016 abdominal CT. Hyperinflation attributed to COPD. Normal heart size. IMPRESSION: Small bilateral pleural effusion with basilar atelectasis. COPD Electronically Signed   By: Monte Fantasia M.D.   On: 04/13/2016 08:17   US Paracentesis  04/12/2016  INDICATION: Metastatic pancreatic cancer with sepsis. Request for diagnostic and therapeutic paracentesis. EXAM: ULTRASOUND GUIDED PARACENTESIS MEDICATIONS: None. COMPLICATIONS: None immediate. PROCEDURE: Informed written consent was obtained from the patient after a discussion of the risks, benefits and alternatives to treatment. A timeout was performed prior to the initiation of the procedure. Initial ultrasound scanning demonstrates a large amount of ascites within the right lower abdominal quadrant. The right lower abdomen was prepped and draped in the usual sterile fashion. 1% lidocaine with epinephrine was used for local anesthesia. Following this, a 6 Fr Safe-T-Centesis catheter was introduced. An ultrasound image was saved for documentation purposes. The paracentesis was performed. The catheter was removed and a dressing was applied. The patient tolerated the procedure well without immediate  post procedural complication. FINDINGS: A total of approximately 3 L of amber colored fluid was removed. Samples were sent to the laboratory as requested by the clinical team. IMPRESSION: Successful ultrasound-guided paracentesis yielding 3 L liters of peritoneal fluid. Electronically Signed   By: Markus Daft M.D.   On: 04/12/2016 15:25    Assessment:  Cody Andrade is a 82 y.o. male with metastatic pancreatic cancer s/p 1 cycle of gemcitabine (began 03/07/2016). 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 He presented with abdominal pain, coffee ground emesis, and E coli sepsis.  Abdominal and pelvic CT scan on 04/10/2016 revealed new ascites, progressive enlargement of the pancreatic cancer and metastatic disease in the liver. There was new splenic lacerations without a subcapsular hematoma or visible acute hemorrhage. There was questionable new venous thrombosis of the left external iliac vein and left common femoral vein.  He underwent paracentesis on 04/12/2016.   Symptomatically, he remains fatigued.  He was able to get out of bed and into a chair for a brief  amount of time yesterday.  Plan: 1.  Oncology:  I had a long talk with the patient in the presence of his family.  Medical power of attorney is apparently his wife (they are  separated).  We discussed his progressive disease on gemcitabine alone and his poor functional status. At this point he is a too weak to undergo more aggressive chemotherapy (gemcitabine and Abraxane). We spent some time discussing the risks versus benefits of chemotherapy.  Given his poor functional status, I recommended supportive care. The patient felt comfortable with this plan. If he improves, he could reconsider chemotherapy. He was agreeable to meeting with Hospice.   We discussed CODE STATUS issues.  My recommendation was that heroic measures  (chest compressions, cardioversion and intubation) not be attempted.  At this point, he wished to remain FULL CODE.  He referred me to living will papers that his wife had brought in.  Nursing confirmed that this paperwork had not been brought in.  We discussed readdressing these issue tomorrow.  2.  Hematology:  Patient on Lovenox for a left lower extremity DVT.  3.  Pulmonary:  Patient has air hunger possibly related to lymphangitic spread of tumor.  Oxygen saturation 94%.  Oxygen prn.  Discussed use of low dose morphine.  May require periodic paracentesis for comfort.  4.  Infectious disease:  Patient on Ceftriaxone for E coli sepsis.   Lequita Asal, MD  04/13/2016, 12:47 PM

## 2016-04-14 MED ORDER — SALINE SPRAY 0.65 % NA SOLN
1.0000 | NASAL | Status: DC | PRN
  Administered 2016-04-14: 16:00:00 1 via NASAL
  Filled 2016-04-14: qty 44

## 2016-04-14 NOTE — Progress Notes (Signed)
[facility] Hematology/Oncology Progress Note  Date of admission: 04/10/2016  Hospital day:  04/14/2016  Chief Complaint: Cody Andrade is a 82 y.o. male with metastatic pancreatic cancer who was admitted with sepsis.  Subjective: Shortness of breath improved with IV morphine.  Abdominal pain slightly better.  Remains weak and fatigued.  He did not get into chair yesterday as he felt too weak.  Social History: The patient is alone today.  Allergies:  Allergies  Allergen Reactions  . Lodine [Etodolac] Nausea And Vomiting    Scheduled Medications: . atorvastatin  80 mg Oral q1800  . cefTRIAXone (ROCEPHIN)  IV  2 g Intravenous Q24H  . docusate sodium  100 mg Oral BID  . enoxaparin (LOVENOX) injection  1.5 mg/kg Subcutaneous Q24H  . feeding supplement (ENSURE ENLIVE)  237 mL Oral TID BM  . fentaNYL  12.5 mcg Transdermal Q72H  . megestrol  200 mg Oral Daily  . methimazole  5 mg Oral Daily  . scopolamine  1 patch Transdermal Q72H  . tamsulosin  0.4 mg Oral QPC breakfast    Review of Systems: GENERAL: General fatigue. No fevers or sweats. PERFORMANCE STATUS (ECOG): 3-4 HEENT: No visual changes, sore throat, mouth sores or tenderness. Lungs: Shortness of breath, improved with oxygen and morphine.  No cough. No hemoptysis. Cardiac: No chest pain, palpitations, orthopnea, or PND. GI: Appetite modest. Nausea. Epigastric pain, improved.  No diarrhea, constipation, melena or hematochezia. GU: No urgency, frequency, dysuria, or hematuria. Musculoskeletal: Back pain. No joint pain. No muscle tenderness. Extremities: No pain or swelling. Skin: No rashes or skin changes. Neuro: General weakness.No headache, numbness or weakness, balance or coordination issues. Endocrine: No diabetes, thyroid issues, hot flashes or night sweats. Psych: No anxiety. Pain: Upper abdominal pain, improved. Review of systems: All other systems reviewed and found to be  negative  Physical Exam: Blood pressure 118/66, pulse 97, temperature 97.8 F (36.6 C), temperature source Oral, resp. rate 16, height 5' 11" (1.803 m), weight 166 lb 3.2 oz (75.388 kg), SpO2 98 %.  GENERAL: Thin, chronically ill appearing gentleman lying comfortably on the medical unit in no acute distress. MENTAL STATUS: Alert and oriented to person, place and time. HEAD: Pearline Cables hair. Normocephalic, atraumatic, face symmetric, no Cushingoid features. EYES: Glasses. Hazel eyes. Pupils equal round and reactive to light and accomodation. No conjunctivitis or scleral icterus. RESPIRATORY: Poor respiratory excursion.  Decreased breath sounds at the bases.  Clear to auscultation without rales, wheezes or rhonchi. CARDIOVASCULAR: Regular rate and rhythm without murmur, rub or gallop. ABDOMEN: Soft, distended, slightly tender in the epigastric region without guarding or rebound tenderness. No hepatosplenomegaly. No masses. SKIN: No rashes, ulcers or lesions. EXTREMITIES: Left lower extremity 2+ edema. Right 1+ ankle edema.  No skin discoloration or tenderness. LYMPH NODES: No palpable cervical, supraclavicular, axillary or inguinal adenopathy  NEUROLOGICAL: Unremarkable. PSYCH: Appropriate  Results for orders placed or performed during the hospital encounter of 04/10/16 (from the past 48 hour(s))  CULTURE, BLOOD (ROUTINE X 2) w Reflex to PCR ID Panel     Status: None (Preliminary result)   Collection Time: 04/12/16 12:33 PM  Result Value Ref Range   Specimen Description BLOOD LEFT WRIST    Special Requests BOTTLES DRAWN AEROBIC AND ANAEROBIC  1CC    Culture NO GROWTH 1 DAY    Report Status PENDING   Protime-INR     Status: Abnormal   Collection Time: 04/12/16 12:33 PM  Result Value Ref Range   Prothrombin Time  17.7 (H) 11.4 - 15.0 seconds   INR 1.45   Protein, pleural or peritoneal fluid     Status: None   Collection Time: 04/12/16  2:25 PM  Result Value Ref Range   Total  protein, fluid <3.0 g/dL    Comment: SPECIMEN SENT TO [facility] FOR FURTHER TESTIING (NOTE) No normal range established for this test Results should be evaluated in conjunction with serum values    Fluid Type-FTP Peritoneal   Body fluid cell count with differential     Status: Abnormal   Collection Time: 04/12/16  2:25 PM  Result Value Ref Range   Fluid Type-FCT Body Fluid    Color, Fluid YELLOW YELLOW   Appearance, Fluid CLOUDY (A) CLEAR   WBC, Fluid 24770 cu mm   Neutrophil Count, Fluid 86 %   Lymphs, Fluid 10 %   Monocyte-Macrophage-Serous Fluid 4 %   Eos, Fluid 0 %   Other Cells, Fluid 0 %  Culture, body fluid-bottle     Status: None (Preliminary result)   Collection Time: 04/12/16  2:25 PM  Result Value Ref Range   Specimen Description ASCITIC ABDOMINAL FLUID    Special Requests NONE    Culture NO GROWTH < 24 HOURS    Report Status PENDING   Miscellaneous [facility] test (send-out)     Status: None   Collection Time: 04/12/16  2:25 PM  Result Value Ref Range   [facility] test code [id number]    [facility] test name FLUID TOTAL PROTEIN    Misc [facility] result COMMENT     Comment: (NOTE) Test Ordered: [id number] Protein, Body Fluid Protein, Body Fluid            1.2              g/dL     BN   ________________________________________________________ :  Peritoneal  :       Pleural          :   Synovial     : :______________:________________________:________________: :              : Transudate :  Exudate  :                : :______________:____________:___________:________________: :  Not Estab.  :   <3 g/dL  :  >3 g/dL  :    <2.5 g/dL   : :______________:____________:___________:________________: The method performance specifications have not been established for this test in body fluid. The test result should be integrated into the clinical context for interpretation. The method performance specifications have not been established for this test in body fluid.  The test result should be  integrated into the clinical context for interpretation. Performed At: [facility] [place], [place] [id number] Lindon Romp MD [id number]   Synovial cell count + diff, w/ crystals     Status: Abnormal   Collection Time: 04/12/16  2:25 PM  Result Value Ref Range   Color, Synovial YELLOW YELLOW   Appearance-Synovial CLOUDY (A) CLEAR   Crystals, Fluid NONE SEEN    WBC, Synovial 24770 (H) 0 - 200 /cu mm   Neutrophil, Synovial 86 %   Lymphocytes-Synovial Fld 10 %   Monocyte-Macrophage-Synovial Fluid 4 %   Eosinophils-Synovial 0 %   Other Cells-SYN 0   APTT     Status: Abnormal   Collection Time: 04/12/16  3:26 PM  Result Value Ref Range   aPTT 55 (H) 24 - 36 seconds    Comment:  IF BASELINE aPTT IS ELEVATED, SUGGEST PATIENT RISK ASSESSMENT BE USED TO DETERMINE APPROPRIATE ANTICOAGULANT THERAPY.   Protein, total     Status: Abnormal   Collection Time: 04/12/16  3:26 PM  Result Value Ref Range   Total Protein 5.3 (L) 6.5 - 8.1 g/dL  CULTURE, BLOOD (ROUTINE X 2) w Reflex to PCR ID Panel     Status: None (Preliminary result)   Collection Time: 04/12/16  3:26 PM  Result Value Ref Range   Specimen Description BLOOD LEFT HAND    Special Requests BOTTLES DRAWN AEROBIC AND ANAEROBIC [id number],1CCANA    Culture NO GROWTH < 24 HOURS    Report Status PENDING   Heparin level (unfractionated)     Status: Abnormal   Collection Time: 04/12/16  3:26 PM  Result Value Ref Range   Heparin Unfractionated 1.53 (H) 0.30 - 0.70 IU/mL    Comment:        IF HEPARIN RESULTS ARE BELOW EXPECTED VALUES, AND PATIENT DOSAGE HAS BEEN CONFIRMED, SUGGEST FOLLOW UP TESTING OF ANTITHROMBIN III LEVELS.   APTT     Status: Abnormal   Collection Time: 04/13/16  2:18 AM  Result Value Ref Range   aPTT 123 (H) 24 - 36 seconds    Comment:        IF BASELINE aPTT IS ELEVATED, SUGGEST PATIENT RISK ASSESSMENT BE USED TO DETERMINE APPROPRIATE ANTICOAGULANT THERAPY.    Heparin level (unfractionated)     Status: Abnormal   Collection Time: 04/13/16  2:18 AM  Result Value Ref Range   Heparin Unfractionated 0.73 (H) 0.30 - 0.70 IU/mL    Comment:        IF HEPARIN RESULTS ARE BELOW EXPECTED VALUES, AND PATIENT DOSAGE HAS BEEN CONFIRMED, SUGGEST FOLLOW UP TESTING OF ANTITHROMBIN III LEVELS.   Basic metabolic panel     Status: Abnormal   Collection Time: 04/13/16  5:38 AM  Result Value Ref Range   Sodium 136 135 - 145 mmol/L   Potassium 3.6 3.5 - 5.1 mmol/L   Chloride 105 101 - 111 mmol/L   CO2 21 (L) 22 - 32 mmol/L   Glucose, Bld 107 (H) 65 - 99 mg/dL   BUN 39 (H) 6 - 20 mg/dL   Creatinine, Ser 1.27 (H) 0.61 - 1.24 mg/dL   Calcium 7.7 (L) 8.9 - 10.3 mg/dL   GFR calc non Af Amer 52 (L) >60 mL/min   GFR calc Af Amer 60 (L) >60 mL/min    Comment: (NOTE) The eGFR has been calculated using the CKD EPI equation. This calculation has not been validated in all clinical situations. eGFR's persistently <60 mL/min signify possible Chronic Kidney Disease.    Anion gap 10 5 - 15  CBC     Status: Abnormal   Collection Time: 04/13/16 12:02 PM  Result Value Ref Range   WBC 16.0 (H) 3.8 - 10.6 K/uL   RBC 2.85 (L) 4.40 - 5.90 MIL/uL   Hemoglobin 8.5 (L) 13.0 - 18.0 g/dL   HCT 26.3 (L) 40.0 - 52.0 %   MCV 92.4 80.0 - 100.0 fL   MCH 29.8 26.0 - 34.0 pg   MCHC 32.3 32.0 - 36.0 g/dL   RDW 17.6 (H) 11.5 - 14.5 %   Platelets 190 150 - 440 K/uL   Dg Chest 2 View  04/13/2016  CLINICAL DATA:  Shortness of breath EXAM: CHEST  2 VIEW COMPARISON:  04/10/2016 FINDINGS: Stable positioning of left subclavian porta catheter. Layering pleural effusions and basilar atelectasis, as  seen on 04/10/2016 abdominal CT. Hyperinflation attributed to COPD. Normal heart size. IMPRESSION: Small bilateral pleural effusion with basilar atelectasis. COPD Electronically Signed   By: Monte Fantasia M.D.   On: 04/13/2016 08:17   US Paracentesis  04/12/2016  INDICATION: Metastatic  pancreatic cancer with sepsis. Request for diagnostic and therapeutic paracentesis. EXAM: ULTRASOUND GUIDED PARACENTESIS MEDICATIONS: None. COMPLICATIONS: None immediate. PROCEDURE: Informed written consent was obtained from the patient after a discussion of the risks, benefits and alternatives to treatment. A timeout was performed prior to the initiation of the procedure. Initial ultrasound scanning demonstrates a large amount of ascites within the right lower abdominal quadrant. The right lower abdomen was prepped and draped in the usual sterile fashion. 1% lidocaine with epinephrine was used for local anesthesia. Following this, a 6 Fr Safe-T-Centesis catheter was introduced. An ultrasound image was saved for documentation purposes. The paracentesis was performed. The catheter was removed and a dressing was applied. The patient tolerated the procedure well without immediate post procedural complication. FINDINGS: A total of approximately 3 L of amber colored fluid was removed. Samples were sent to the laboratory as requested by the clinical team. IMPRESSION: Successful ultrasound-guided paracentesis yielding 3 L liters of peritoneal fluid. Electronically Signed   By: Markus Daft M.D.   On: 04/12/2016 15:25    Assessment:  RUEBEN KASSIM is a 82 y.o. male with metastatic pancreatic cancer s/p 1 cycle of gemcitabine (began 03/07/2016). 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 He presented with abdominal pain, coffee ground emesis, and E coli sepsis.  Abdominal and pelvic CT scan on 04/10/2016 revealed new ascites, progressive enlargement of the pancreatic cancer and metastatic disease in the liver. There was new splenic lacerations without a subcapsular hematoma or visible acute hemorrhage. There was questionable new venous thrombosis of the left external iliac vein and left common femoral  vein.  He underwent paracentesis on 04/12/2016.   Symptomatically, he remains fatigued.  He was unable to get out of bed and into a chair yesterday.  Plan: 1.  Oncology:  Metastatic pancreatic cancer.  Abdominal pain improved on Fentanyl patch and morphine prn.  Will likely need periodic paracentesis as ascites reaccumulating.  Patient still interested in Hospice, but wants to remain FULL CODE.  When discussing, he closes his eyes and says his paperwork (living will) is in his blue or red jacket.  We discussed his general debilitation.  He stated that he wished to go home with Hospice support and consider treatment if he improves in the future.   2.  Hematology:  Patient on Lovenox for a left lower extremity DVT.  3.  Pulmonary:  Patient's air hunger related to lymphangitic spread of tumor. Patient now on oxygen and morphine prn.    4.  Infectious disease:  Patient on Ceftriaxone for E coli sepsis.  Afebrile.  WBC remains elevated.  Clinically stable.   5.  Disposition:  Anticipate discharge home with Hospice services.  Extended family not present for today's conversation.   Lequita Asal, MD  04/14/2016, 12:06 PM

## 2016-04-14 NOTE — Progress Notes (Signed)
Patient ID: Cody Andrade, male   DOB: Dec 28, 1932, 82 y.o.   MRN: [id number] [facility] PROGRESS NOTE  Cody Andrade [id number] DOB: [birth year] DOA: 04/10/2016 PCP: No PCP Per Patient  HPI/Subjective: States his abdomen pain is little better today. Started on Duragesic patch   Objective: Filed Vitals:   04/14/16 0100 04/14/16 0521  BP: 124/71 118/66  Pulse: 115 97  Temp: 98.4 F (36.9 C) 97.8 F (36.6 C)  Resp:  16    Filed Weights   04/12/16 1000 04/13/16 0500 04/14/16 0500  Weight: 76.93 kg (169 lb 9.6 oz) 74.208 kg (163 lb 9.6 oz) 75.388 kg (166 lb 3.2 oz)    ROS: Review of Systems  Constitutional: Negative for fever and chills.  Eyes: Negative for blurred vision.  Respiratory: Negative for cough and positiveshortness of breath.   Cardiovascular: Negative for chest pain.  Gastrointestinal: Persistent abdominal pain  distention improved. Negative for nausea, vomiting, diarrhea and constipation.  Genitourinary: Negative for dysuria.  Musculoskeletal: Negative for joint pain.  Neurological: Negative for dizziness and headaches.   Exam: Physical Exam  Constitutional: He is oriented to person, place, and time.  HENT:  Nose: No mucosal edema.  Mouth/Throat: No oropharyngeal exudate or posterior oropharyngeal edema.  Eyes: Conjunctivae, EOM and lids are normal. Pupils are equal, round, and reactive to light.  Neck: No JVD present. Carotid bruit is not present. No edema present. No thyroid mass and no thyromegaly present.  Cardiovascular: S1 normal and S2 normal.  Tachycardia present.  Exam reveals no gallop.   Murmur heard.  Systolic murmur is present with a grade of 2/6  Pulses:      Dorsalis pedis pulses are 2+ on the right side, and 2+ on the left side.  Respiratory: No respiratory distress. He has no wheezes. He has no rhonchi. He has no rales.  GI: Soft. Bowel sounds are normal. There is generalized tenderness.  Musculoskeletal:       Right ankle: He  exhibits no swelling.       Left ankle: He exhibits no swelling.  Lymphadenopathy:    He has no cervical adenopathy.  Neurological: He is alert and oriented to person, place, and time. No cranial nerve deficit.  Skin: Skin is warm. No rash noted. Nails show no clubbing.  Psychiatric: He has a normal mood and affect.      Data Reviewed: Basic Metabolic Panel:  Recent Labs Lab 04/10/16 1356 04/11/16 0408 04/12/16 0558 04/13/16 0538  NA 132* 131* 134* 136  K 3.6 4.0 3.8 3.6  CL 98* 102 105 105  CO2 19* 18* 20* 21*  GLUCOSE 132* 150* 142* 107*  BUN 30* 34* 39* 39*  CREATININE 1.28* 1.31* 1.16 1.27*  CALCIUM 8.3* 7.6* 7.6* 7.7*   Liver Function Tests:  Recent Labs Lab 04/10/16 1613 04/12/16 1526  AST 29  --   ALT 30  --   ALKPHOS 245*  --   BILITOT 1.0  --   PROT <3.0* 5.3*  ALBUMIN 1.2*  --     Recent Labs Lab 04/10/16 1613  LIPASE 13   CBC:  Recent Labs Lab 04/10/16 1356 04/11/16 0408 04/12/16 0558 04/13/16 1202  WBC 10.7* 14.0* 16.3* 16.0*  NEUTROABS 9.7*  --   --   --   HGB 11.4* 9.5* 8.8* 8.5*  HCT 35.4* 29.0* 26.6* 26.3*  MCV 94.1 94.5 92.4 92.4  PLT 295 185 189 190   Cardiac Enzymes:  Recent Labs Lab 04/10/16 1613  TROPONINI <  0.03     Recent Results (from the past 240 hour(s))  Blood culture (routine x 2)     Status: Abnormal (Preliminary result)   Collection Time: 04/10/16  4:13 PM  Result Value Ref Range Status   Specimen Description BLOOD RIGHT FATTY CASTS  Final   Special Requests BOTTLES DRAWN AEROBIC AND ANAEROBIC 2CCAERO,1CCANA  Final   Culture  Setup Time   Final    GRAM NEGATIVE RODS AEROBIC BOTTLE ONLY CRITICAL RESULT CALLED TO, READ BACK BY AND VERIFIED WITH: CHRISTINA KATSOUDAS ON 04/11/16 AT 0901 BY QSD    Culture ESCHERICHIA COLI AEROBIC BOTTLE ONLY  (A)  Final   Report Status PENDING  Incomplete   Organism ID, Bacteria ESCHERICHIA COLI  Final      Susceptibility   Escherichia coli - MIC*    AMPICILLIN >=32  RESISTANT Resistant     CEFAZOLIN >=64 RESISTANT Resistant     CEFEPIME <=1 SENSITIVE Sensitive     CEFTAZIDIME <=1 SENSITIVE Sensitive     CEFTRIAXONE <=1 SENSITIVE Sensitive     CIPROFLOXACIN >=4 RESISTANT Resistant     GENTAMICIN <=1 SENSITIVE Sensitive     IMIPENEM <=0.25 SENSITIVE Sensitive     TRIMETH/SULFA <=20 SENSITIVE Sensitive     AMPICILLIN/SULBACTAM >=32 RESISTANT Resistant     PIP/TAZO 64 INTERMEDIATE Intermediate     Extended ESBL NEGATIVE Sensitive     * ESCHERICHIA COLI  Blood Culture ID Panel (Reflexed)     Status: Abnormal   Collection Time: 04/10/16  4:13 PM  Result Value Ref Range Status   Enterococcus species NOT DETECTED NOT DETECTED Final   Vancomycin resistance NOT DETECTED NOT DETECTED Final   Listeria monocytogenes NOT DETECTED NOT DETECTED Final   Staphylococcus species NOT DETECTED NOT DETECTED Final   Staphylococcus aureus NOT DETECTED NOT DETECTED Final   Methicillin resistance NOT DETECTED NOT DETECTED Final   Streptococcus species NOT DETECTED NOT DETECTED Final   Streptococcus agalactiae NOT DETECTED NOT DETECTED Final   Streptococcus pneumoniae NOT DETECTED NOT DETECTED Final   Streptococcus pyogenes NOT DETECTED NOT DETECTED Final   Acinetobacter baumannii NOT DETECTED NOT DETECTED Final   Enterobacteriaceae species DETECTED (A) NOT DETECTED Final    Comment: CRITICAL RESULT CALLED TO, READ BACK BY AND VERIFIED WITH: CHRISTINA KATSOUDAS ON 04/11/16 AT 0901 BY QSD    Enterobacter cloacae complex NOT DETECTED NOT DETECTED Final   Escherichia coli DETECTED (A) NOT DETECTED Final    Comment: CRITICAL RESULT CALLED TO, READ BACK BY AND VERIFIED WITH: CHRISTINA KATSOUDAS ON 04/11/16 AT 0901 BY QSD    Klebsiella oxytoca NOT DETECTED NOT DETECTED Final   Klebsiella pneumoniae NOT DETECTED NOT DETECTED Final   Proteus species NOT DETECTED NOT DETECTED Final   Serratia marcescens NOT DETECTED NOT DETECTED Final   Carbapenem resistance NOT DETECTED NOT  DETECTED Final   Haemophilus influenzae NOT DETECTED NOT DETECTED Final   Neisseria meningitidis NOT DETECTED NOT DETECTED Final   Pseudomonas aeruginosa NOT DETECTED NOT DETECTED Final   Candida albicans NOT DETECTED NOT DETECTED Final   Candida glabrata NOT DETECTED NOT DETECTED Final   Candida krusei NOT DETECTED NOT DETECTED Final   Candida parapsilosis NOT DETECTED NOT DETECTED Final   Candida tropicalis NOT DETECTED NOT DETECTED Final  Blood culture (routine x 2)     Status: None (Preliminary result)   Collection Time: 04/10/16  4:14 PM  Result Value Ref Range Status   Specimen Description BLOOD LEFT ASSIST CONTROL  Final   Special  Requests BOTTLES DRAWN AEROBIC AND ANAEROBIC [place]  Final   Culture NO GROWTH 3 DAYS  Final   Report Status PENDING  Incomplete  Urine culture     Status: None   Collection Time: 04/11/16  9:35 AM  Result Value Ref Range Status   Specimen Description URINE, RANDOM  Final   Special Requests NONE  Final   Culture NO GROWTH 2 DAYS  Final   Report Status 04/13/2016 FINAL  Final  CULTURE, BLOOD (ROUTINE X 2) w Reflex to PCR ID Panel     Status: None (Preliminary result)   Collection Time: 04/12/16 12:33 PM  Result Value Ref Range Status   Specimen Description BLOOD LEFT WRIST  Final   Special Requests BOTTLES DRAWN AEROBIC AND ANAEROBIC  1CC  Final   Culture NO GROWTH 1 DAY  Final   Report Status PENDING  Incomplete  Culture, body fluid-bottle     Status: None (Preliminary result)   Collection Time: 04/12/16  2:25 PM  Result Value Ref Range Status   Specimen Description ASCITIC ABDOMINAL FLUID  Final   Special Requests NONE  Final   Culture NO GROWTH < 24 HOURS  Final   Report Status PENDING  Incomplete  CULTURE, BLOOD (ROUTINE X 2) w Reflex to PCR ID Panel     Status: None (Preliminary result)   Collection Time: 04/12/16  3:26 PM  Result Value Ref Range Status   Specimen Description BLOOD LEFT HAND  Final   Special Requests BOTTLES DRAWN  AEROBIC AND ANAEROBIC 2CCAERO,1CCANA  Final   Culture NO GROWTH < 24 HOURS  Final   Report Status PENDING  Incomplete     Studies: Dg Chest 2 View  04/13/2016  CLINICAL DATA:  Shortness of breath EXAM: CHEST  2 VIEW COMPARISON:  04/10/2016 FINDINGS: Stable positioning of left subclavian porta catheter. Layering pleural effusions and basilar atelectasis, as seen on 04/10/2016 abdominal CT. Hyperinflation attributed to COPD. Normal heart size. IMPRESSION: Small bilateral pleural effusion with basilar atelectasis. COPD Electronically Signed   By: Monte Fantasia M.D.   On: 04/13/2016 08:17   US Paracentesis  04/12/2016  INDICATION: Metastatic pancreatic cancer with sepsis. Request for diagnostic and therapeutic paracentesis. EXAM: ULTRASOUND GUIDED PARACENTESIS MEDICATIONS: None. COMPLICATIONS: None immediate. PROCEDURE: Informed written consent was obtained from the patient after a discussion of the risks, benefits and alternatives to treatment. A timeout was performed prior to the initiation of the procedure. Initial ultrasound scanning demonstrates a large amount of ascites within the right lower abdominal quadrant. The right lower abdomen was prepped and draped in the usual sterile fashion. 1% lidocaine with epinephrine was used for local anesthesia. Following this, a 6 Fr Safe-T-Centesis catheter was introduced. An ultrasound image was saved for documentation purposes. The paracentesis was performed. The catheter was removed and a dressing was applied. The patient tolerated the procedure well without immediate post procedural complication. FINDINGS: A total of approximately 3 L of amber colored fluid was removed. Samples were sent to the laboratory as requested by the clinical team. IMPRESSION: Successful ultrasound-guided paracentesis yielding 3 L liters of peritoneal fluid. Electronically Signed   By: Markus Daft M.D.   On: 04/12/2016 15:25    Scheduled Meds: . atorvastatin  80 mg Oral q1800  .  cefTRIAXone (ROCEPHIN)  IV  2 g Intravenous Q24H  . docusate sodium  100 mg Oral BID  . enoxaparin (LOVENOX) injection  1.5 mg/kg Subcutaneous Q24H  . feeding supplement (ENSURE ENLIVE)  237 mL Oral TID  BM  . fentaNYL  12.5 mcg Transdermal Q72H  . megestrol  200 mg Oral Daily  . methimazole  5 mg Oral Daily  . scopolamine  1 patch Transdermal Q72H  . tamsulosin  0.4 mg Oral QPC breakfast   Continuous Infusions:    Assessment/Plan:  1. Clinical sepsis with Escherichia coli elated to metastatic pancreatic cancer, on Rocephin 2 g IV recent blood cultures negative WBC continues to be elevated I suspect could be related to his underlying malignancy 2. Metastatic pancreatic cancer to liver, ascites.  Patient with progressive pain Seen by oncology  Patient still wants to be a full code Unfortunately this is not a curable cancer. 3. Acute kidney injury resolved  4. Hyponatremia likely secondary to poor intake 5. New venous thrombosis left external iliac and left common femoral vein. On Lovenox full dose 6.  New splenic lacerations. Monitor closely  Code Status:     Code Status Orders        Start     Ordered   04/10/16 2002  Full code   Continuous     04/10/16 2001    Code Status History    Date Active Date Inactive Code Status Order ID Comments User Context   04/04/2016 11:59 AM 04/06/2016  5:01 PM Full Code [id number]  Lytle Butte, MD ED   02/09/2016 12:32 AM 02/11/2016  4:52 PM Full Code [id number]  Fritzi Mandes, MD Inpatient    Advance Directive Documentation        Most Recent Value   Type of Advance Directive  Healthcare Power of [place], Living will [POA: Mayford Beckstrand (spouse)]   Pre-existing out of facility DNR order (yellow form or pink MOST form)     "MOST" Form in Place?       Family Communication: Discussed with the patient Disposition Plan: To be determined  Consultants:  Oncology  Antibiotics:  Zosyn  Time spent: 25 minutes  [place], [place]  [facility]

## 2016-04-15 LAB — CULTURE, BLOOD (ROUTINE X 2): CULTURE: NO GROWTH

## 2016-04-15 LAB — CYTOLOGY - NON PAP

## 2016-04-15 MED ORDER — FENTANYL 12 MCG/HR TD PT72: 12.5000 ug | MEDICATED_PATCH | TRANSDERMAL | Status: AC

## 2016-04-15 MED ORDER — ENOXAPARIN SODIUM 150 MG/ML ~~LOC~~ SOLN: 1.5000 mg/kg | SUBCUTANEOUS | Status: AC

## 2016-04-15 MED ORDER — POLYETHYLENE GLYCOL 3350 17 G PO PACK: 17.0000 g | PACK | Freq: Every day | ORAL | Status: DC

## 2016-04-15 MED ORDER — OXYCODONE HCL 5 MG PO TABS: 5.0000 mg | ORAL_TABLET | ORAL | Status: AC | PRN

## 2016-04-15 MED ORDER — SENNA 8.6 MG PO TABS
2.0000 | ORAL_TABLET | Freq: Every day | ORAL | Status: DC
  Administered 2016-04-15 – 2016-04-16 (×2): 17.2 mg via ORAL
  Filled 2016-04-15 (×2): qty 2

## 2016-04-15 MED ORDER — POLYETHYLENE GLYCOL 3350 17 G PO PACK: 17.0000 g | PACK | Freq: Every day | ORAL | Status: AC

## 2016-04-15 MED ORDER — MIRTAZAPINE 30 MG PO TABS: 30.0000 mg | ORAL_TABLET | Freq: Every day | ORAL | Status: AC

## 2016-04-15 MED ORDER — SENNA 8.6 MG PO TABS: 2.0000 | ORAL_TABLET | Freq: Every day | ORAL | Status: AC

## 2016-04-15 NOTE — Progress Notes (Signed)
Alert and oriented, visual hallucinations at times that family reports as ongoing for several days, daughter at bedside, poor appetite, intake with sips/ bites only, using intermittent oxygen for shortness of breath, up in chair during shift, morphine given for pain, consulted with [facility], likely will d/c to home with hospice tomorrow.

## 2016-04-15 NOTE — Progress Notes (Signed)
Notified Dr. Posey Pronto in person that patient has not had a bm since 04/09/16. MD acknowledged.

## 2016-04-15 NOTE — Progress Notes (Signed)
Pt weak today and has slept most of shift.  Encouraged pt to sit up in chair at lunch time, pt refused stating maybe later.  Wife and daughter's at bedside throughout day.  Pt taking in ginger ale when offered but only bits and sips at meals.  Repositioned throughout shift several times to increase comfort.  Pain and nausea meds given X1.

## 2016-04-15 NOTE — Discharge Instructions (Signed)

## 2016-04-15 NOTE — Care Management (Signed)
Spoke with Ms. Raterman. Would like to talk with Flo Shanks, RN representative for [facility] about the advantages and disadvantages of Hospice vs [facility]. Hassan Rowan s [facility] [phone]

## 2016-04-15 NOTE — Progress Notes (Signed)
Notified Dr. Posey Pronto that patient would like diet advanced, okay to advance diet to regular.

## 2016-04-15 NOTE — Discharge Summary (Deleted)
Cody Andrade, 82 y.o., DOB 1932-12-31, MRN [id number]. Admission date: 04/10/2016 Discharge Date 04/15/2016 Primary MD No PCP Per Patient Admitting Physician Demetrios Loll, MD  Admission Diagnosis  Abdominal pain, unspecified abdominal location [R10.9] Sepsis, due to unspecified organism [facility]) [A41.9]  Discharge Diagnosis   Principal Problem:   Sepsis ([place]) due to E.coli   Pancreatic cancer metastasized to liver [facility])   Pancreatic cancer metastasized to lung [facility])   Thrombosis of left lower extremity    Recent CVA   Acute kidney injury Hyponatremia Splenic laceration. Unclear cause       Hospital Course Cody Andrade is a 82 y.o. male with a known history of left leg DVT, CVA, pancreatitis cancer with metastasis, hypertension, diverticulitis and GERD. The patient presents to the ED with worsening abdominal pain and abdominal distention. Patient does have known history of pancreatic cancer. Also in the ER he was noted to have signs of sepsis. He was admitted for further evaluation and treatment. Initially was started on broad-spectrum antibiotics for sepsis. Subsequently his blood cultures did confirm Escherichia coli as a cause of the sepsis. Patient was continued on antibiotics. And subsequently changed to oral therapy. In terms of his abdominal pain. CT of the abdomen showed progression of his pancreatic cancer. Also had significant ascites which wasn't present in March. Further discussions were held with the patient and family regarding overall program poor prognosis with patient having pancreatic cancer. Patient continues to want to be a full code. His pain is controlled. Currently he is doing well and stable enough to be discharged home with adjustment of his pain medications as outpatient. Patient also had ascites in the abdomen which was drained due to symptomatic therapy. 3 L were removed. I suspect this will reaccumulate and a brief period of time.            Consults   hematology/oncology  Sig Tests:  See full reports for all details      Ct Angio Head W/cm &/or Wo Cm  04/04/2016  CLINICAL DATA:  82 year old male with left eye peripheral vision loss since waking today. Initial encounter. Pancreatic cancer. EXAM: CT ANGIOGRAPHY HEAD AND NECK TECHNIQUE: Multidetector CT imaging of the head and neck was performed using the standard protocol during bolus administration of intravenous contrast. Multiplanar CT image reconstructions and MIPs were obtained to evaluate the vascular anatomy. Carotid stenosis measurements (when applicable) are obtained utilizing NASCET criteria, using the distal internal carotid diameter as the denominator. CONTRAST:  75 mL Isovue 370. COMPARISON:  Head CT without contrast 1103 hours today. PET-CT 02/15/2016 FINDINGS: CTA NECK Skeleton: No acute osseous abnormality identified. Degenerative changes in the cervical spine with moderate to severe degenerative spinal stenosis that C5-C6. Other neck: Left chest porta cath, subclavian approach. Centrilobular and paraseptal emphysema. Apical lung scarring. At the level of the right hilum there is partially visible new peribronchial nodularity in spiculated opacity (series 4, image 11). There is fairly widespread fine nodular opacity in the right upper lobe which seems to be new since March. Prevascular superior mediastinal metastatic disease appears stable to mildly increased (series 4, image 68). Superimposed large heterogeneous thyroid goiter with superior mediastinal extension and dystrophic calcifications. Larynx, pharynx, parapharyngeal spaces, retropharyngeal space, sublingual space, submandibular glands, and parotid glands are within normal limits. Small metastatic nodes anterior to the left subclavian artery re- demonstrated (series 4, image 91) an may be mildly progressed. Aortic arch: 3 vessel arch configuration. Moderate soft and calcified arch atherosclerosis. No great vessel origin stenosis.  Right  carotid system: Negative right CCA. Calcified plaque at the posterior right ICA and bulb. No right ICA origin stenosis. At the level of the distal bulb there is additional soft plaque, but subsequent stenosis is less than 50 % with respect to the distal vessel. See series 9, image 57). Right ICA remains patent to the skullbase. Left carotid system: No left CCA origin stenosis. Negative left CCA. Mild plaque at the left carotid bifurcation without stenosis. Negative cervical left ICA. Vertebral arteries:No proximal right subclavian artery stenosis despite plaque. Normal right vertebral artery origin. Mildly dominant right vertebral artery is normal to the skullbase. No proximal left subclavian artery stenosis despite plaque. Dense calcified plaque at the left vertebral artery origin resulting in severe left vertebral artery origin stenosis. Non dominant left vertebral artery remains patent and is otherwise negative to the skullbase. CTA HEAD Posterior circulation: Patent distal vertebral arteries, the right is dominant. Normal PICA origins. Normal vertebrobasilar junction. No basilar artery stenosis. Normal SCA and PCA origins. Right posterior communicating artery is present, the left is diminutive or absent. The distal left PCA branches appear occluded in the P3 division (series 10, image 23). Right PCA branches are within normal limits. Anterior circulation: Both ICA siphons are patent without significant plaque or stenosis. Both ophthalmic arteries appear to remain patent. Patent carotid termini. Normal MCA and ACA origins. Anterior communicating artery and bilateral ACA branches are within normal limits. Left MCA M1 segment, bifurcation, and left MCA branches are within normal limits. Right MCA M1 segment, bifurcation, and right MCA branches are within normal limits. Venous sinuses: Patent. Anatomic variants: Dominant right vertebral artery. Delayed phase: No acute cortically based infarct identified, including  in the left PCA territory. No abnormal enhancement identified. No midline shift, mass effect, or evidence of intracranial mass lesion. IMPRESSION: 1. Negative for emergent large vessel occlusion, but suggestion of occluded distal left PCA branches. However, there is no CT evidence of left PCA territory ischemia at this time. 2. Moderate to severe atherosclerotic stenosis of the left vertebral artery origin. Otherwise aortic arch and neck atherosclerosis without hemodynamically significant stenosis. 3. Abnormal right upper lobe nodular and spiculated opacities appear to be new since March. Top differential considerations in this setting include metastatic disease and acute respiratory infection. 4. Metastatic nodal disease at the left thoracic inlet and superior mediastinum. Electronically Signed   By: Genevie Ann M.D.   On: 04/04/2016 14:11   Dg Chest 2 View  04/13/2016  CLINICAL DATA:  Shortness of breath EXAM: CHEST  2 VIEW COMPARISON:  04/10/2016 FINDINGS: Stable positioning of left subclavian porta catheter. Layering pleural effusions and basilar atelectasis, as seen on 04/10/2016 abdominal CT. Hyperinflation attributed to COPD. Normal heart size. IMPRESSION: Small bilateral pleural effusion with basilar atelectasis. COPD Electronically Signed   By: Monte Fantasia M.D.   On: 04/13/2016 08:17   Ct Head Wo Contrast  04/04/2016  CLINICAL DATA:  Code stroke EXAM: CT HEAD WITHOUT CONTRAST TECHNIQUE: Contiguous axial images were obtained from the base of the skull through the vertex without intravenous contrast. COMPARISON:  05/08/2013 FINDINGS: No skull fracture is noted. Paranasal sinuses and mastoid air cells are unremarkable. Mild cerebral atrophy. Mild periventricular chronic white matter disease. No definite acute cortical infarction. No mass lesion is noted on this unenhanced scan. Ventricular size is stable from prior exam. IMPRESSION: No acute intracranial abnormality. Mild cerebral atrophy. Mild  periventricular chronic white matter disease. No definite acute cortical infarction. These results were called by telephone at  the time of interpretation on 04/04/2016 at 11:13 am to Dr. Delman Kitten , who verbally acknowledged these results. Electronically Signed   By: Lahoma Crocker M.D.   On: 04/04/2016 11:13   Ct Angio Neck W/cm &/or Wo/cm  04/04/2016  CLINICAL DATA:  82 year old male with left eye peripheral vision loss since waking today. Initial encounter. Pancreatic cancer. EXAM: CT ANGIOGRAPHY HEAD AND NECK TECHNIQUE: Multidetector CT imaging of the head and neck was performed using the standard protocol during bolus administration of intravenous contrast. Multiplanar CT image reconstructions and MIPs were obtained to evaluate the vascular anatomy. Carotid stenosis measurements (when applicable) are obtained utilizing NASCET criteria, using the distal internal carotid diameter as the denominator. CONTRAST:  75 mL Isovue 370. COMPARISON:  Head CT without contrast 1103 hours today. PET-CT 02/15/2016 FINDINGS: CTA NECK Skeleton: No acute osseous abnormality identified. Degenerative changes in the cervical spine with moderate to severe degenerative spinal stenosis that C5-C6. Other neck: Left chest porta cath, subclavian approach. Centrilobular and paraseptal emphysema. Apical lung scarring. At the level of the right hilum there is partially visible new peribronchial nodularity in spiculated opacity (series 4, image 11). There is fairly widespread fine nodular opacity in the right upper lobe which seems to be new since March. Prevascular superior mediastinal metastatic disease appears stable to mildly increased (series 4, image 68). Superimposed large heterogeneous thyroid goiter with superior mediastinal extension and dystrophic calcifications. Larynx, pharynx, parapharyngeal spaces, retropharyngeal space, sublingual space, submandibular glands, and parotid glands are within normal limits. Small metastatic nodes  anterior to the left subclavian artery re- demonstrated (series 4, image 91) an may be mildly progressed. Aortic arch: 3 vessel arch configuration. Moderate soft and calcified arch atherosclerosis. No great vessel origin stenosis. Right carotid system: Negative right CCA. Calcified plaque at the posterior right ICA and bulb. No right ICA origin stenosis. At the level of the distal bulb there is additional soft plaque, but subsequent stenosis is less than 50 % with respect to the distal vessel. See series 9, image 57). Right ICA remains patent to the skullbase. Left carotid system: No left CCA origin stenosis. Negative left CCA. Mild plaque at the left carotid bifurcation without stenosis. Negative cervical left ICA. Vertebral arteries:No proximal right subclavian artery stenosis despite plaque. Normal right vertebral artery origin. Mildly dominant right vertebral artery is normal to the skullbase. No proximal left subclavian artery stenosis despite plaque. Dense calcified plaque at the left vertebral artery origin resulting in severe left vertebral artery origin stenosis. Non dominant left vertebral artery remains patent and is otherwise negative to the skullbase. CTA HEAD Posterior circulation: Patent distal vertebral arteries, the right is dominant. Normal PICA origins. Normal vertebrobasilar junction. No basilar artery stenosis. Normal SCA and PCA origins. Right posterior communicating artery is present, the left is diminutive or absent. The distal left PCA branches appear occluded in the P3 division (series 10, image 23). Right PCA branches are within normal limits. Anterior circulation: Both ICA siphons are patent without significant plaque or stenosis. Both ophthalmic arteries appear to remain patent. Patent carotid termini. Normal MCA and ACA origins. Anterior communicating artery and bilateral ACA branches are within normal limits. Left MCA M1 segment, bifurcation, and left MCA branches are within normal  limits. Right MCA M1 segment, bifurcation, and right MCA branches are within normal limits. Venous sinuses: Patent. Anatomic variants: Dominant right vertebral artery. Delayed phase: No acute cortically based infarct identified, including in the left PCA territory. No abnormal enhancement identified. No midline shift, mass  effect, or evidence of intracranial mass lesion. IMPRESSION: 1. Negative for emergent large vessel occlusion, but suggestion of occluded distal left PCA branches. However, there is no CT evidence of left PCA territory ischemia at this time. 2. Moderate to severe atherosclerotic stenosis of the left vertebral artery origin. Otherwise aortic arch and neck atherosclerosis without hemodynamically significant stenosis. 3. Abnormal right upper lobe nodular and spiculated opacities appear to be new since March. Top differential considerations in this setting include metastatic disease and acute respiratory infection. 4. Metastatic nodal disease at the left thoracic inlet and superior mediastinum. Electronically Signed   By: Genevie Ann M.D.   On: 04/04/2016 14:11   Ct Angio Chest Pe W/cm &/or Wo Cm  03/28/2016  CLINICAL DATA:  Shortness of breath for several weeks with minimal exertion, history of pancreatic carcinoma with chemotherapy, some swelling of the left lower extremity EXAM: CT ANGIOGRAPHY CHEST WITH CONTRAST TECHNIQUE: Multidetector CT imaging of the chest was performed using the standard protocol during bolus administration of intravenous contrast. Multiplanar CT image reconstructions and MIPs were obtained to evaluate the vascular anatomy. CONTRAST:  75 cc Isovue 370 COMPARISON:  PET-CT of 02/15/2016 FINDINGS: The pulmonary arteries are relatively well opacified. There is no evidence of acute pulmonary embolism. The thoracic aorta also opacifies moderately well with no acute abnormality. Atherosclerotic change i s noted throughout the aortic arch and descending thoracic aorta. Calcifications  are noted in the distribution of the left main, left anterior descending, and circumflex coronary arteries. Mild cardiomegaly is present but no pericardial effusion is seen. There is some prominence of subcarinal nodes with short axis diameter of 12 mm on image 145. The thyroid gland is significantly enlarged with foci of calcification most consistent with thyroid goiter with some substernal extension. On lung window images and apical pleural-parenchymal scarring is present right much greater than left. There is nodularity throughout the lungs in a perilymphatic distribution. Also there is vague ground-glass opacities in the lower lobes. In addition, there are nodules scattered throughout the lungs primarily right-sided. The largest nodule is in the right upper lobe on image 63 measuring 9 x 20 mm. An adjacent 6 mm nodule is noted as well. A 9 mm nodule is present in the right lower lobe on image 81, with a 7 mm nodule in the right lower lobe on image 101 in addition to an 11 mm nodular opacity. Additional nodules are present bilaterally which are smaller. These findings are worrisome for diffuse metastatic involvement of the lungs with probable changes of lymphangitic carcinomatosis as well. This would be a very atypical presentation for an inflammatory or infectious process. No pleural effusion is seen. The central bronchi are patent. There are degenerative changes in the mid to lower thoracic spine. No lytic or blastic bony lesion is seen. Review of the MIP images confirms the above findings. IMPRESSION: 1. 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 2. No evidence of acute pulmonary embolism. 3. Diffuse coronary artery calcifications. Electronically Signed   By: Ivar Drape M.D.   On: 03/28/2016 12:12   Mr Brain Wo Contrast  04/04/2016   CLINICAL DATA:  82 year old hypertensive male with pancreatic cancer presenting with left-sided visual loss. Subsequent encounter. EXAM: MRI HEAD WITHOUT CONTRAST TECHNIQUE: Multiplanar, multiecho pulse sequences of the brain and surrounding structures were obtained without intravenous contrast. COMPARISON:  04/04/2016 CT angiogram and  CT head.  2014 brain MR. FINDINGS: Acute nonhemorrhagic infarct medial right occipital lobe extending to posterior medial right temporal lobe. No associated hemorrhage. Slow flow within adjacent vessel. Tiny area blood breakdown products left periventricular region probably related to prior episode hemorrhagic ischemia. Minimal small vessel disease changes. Global atrophy without hydrocephalus. Major intracranial vascular structures are patent. No intracranial mass lesion noted on this unenhanced exam. No acute orbital abnormality. Cervical medullary junction unremarkable. Mild cervical spondylotic changes C3-4. 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 No associated hemorrhage. Electronically Signed   By: Genia Del M.D.   On: 04/04/2016 17:40   Ct Abdomen Pelvis W Contrast  04/10/2016  CLINICAL DATA:  Periumbilical pain which developed early this morning with nausea and vomiting. Metastatic pancreatic cancer. EXAM: CT ABDOMEN AND PELVIS WITH CONTRAST TECHNIQUE: Multidetector CT imaging of the abdomen and pelvis was performed using the standard protocol following bolus administration of intravenous contrast. CONTRAST:  12mL ISOVUE-300 IOPAMIDOL (ISOVUE-300) INJECTION 61% COMPARISON:  Abdominal CT dated 02/14/2016 and chest CT dated 03/28/2016 and PET-CT scan dated 02/15/2016 FINDINGS: Lower chest: New small bilateral pleural effusions. Stable 5 mm nodule in the right middle lobe peripherally. Subtle new peripheral edema at the lung bases. The lungs are hyperinflated consistent emphysema.  Hepatobiliary: Numerous metastatic lesions in the liver have progressed. There is new periportal edema. Biliary stent in place with air in the biliary tree. Pancreas: Pancreatic mass measures 3.3 x 2.5 cm, increased from 3.2 x 2.3 cm. Increased atrophy of the pancreatic tail with increased dilatation of the distal pancreatic duct. Spleen: There are 2 new linear areas of abnormal low density in the periphery of the superior aspect of the spleen consistent with splenic lacerations. The configuration is not suggestive of metastatic disease. There is no subcapsular hemorrhage. Adrenals/Urinary Tract: The adrenal glands and right kidney are normal. Benign 14 mm cyst in the upper pole of the left kidney and benign 23 mm cyst in the lower pole of the left kidney. No hydronephrosis. Bladder appears normal. Stomach/Bowel: Extensive diverticulosis of the left side of the colon. Diffuse edema of the colonic mucosa. Small bowel appears normal. Peripheral lacerations in the upper pole of the spleen without subcapsular hematoma. Vascular/Lymphatic: The left external iliac vein is enlarged as is the left common femoral vein and there appear to be thrombi in these vessels. This is not not definitive since mixing of unenhanced blood with enhance blood could give this appearance although the distention of the vein would be atypical and is new since the prior study. Aortic atherosclerosis. Reproductive: Normal. Other: Extensive new ascites since the CT scan of 02/14/2016. Increased ascites since the CT scan of 03/28/2016. Musculoskeletal: Multilevel degenerative facet arthritis in the lumbar spine. No acute abnormality. No visible bone metastases. IMPRESSION: 1. New small bilateral pleural effusions and subtle pulmonary edema. 2. Progressive metastatic disease in the liver. 3. Progressive enlargement of the pancreatic cancer. 4. New splenic lacerations without a subcapsular hematoma or visible acute hemorrhage. 5. New probable venous  thrombosis of the left external iliac vein and left common femoral vein. 6. Aortic atherosclerosis. 7. New extensive ascites since 02/14/2016, progressed since 03/28/2016 Electronically Signed   By: Lorriane Shire M.D.   On: 04/10/2016 16:01   US Venous Img Lower Unilateral Left  03/29/2016  CLINICAL DATA:  Left lower extremity pain and swelling. History of pancreatic cancer. Evaluate for DVT. EXAM: LEFT LOWER EXTREMITY VENOUS DOPPLER ULTRASOUND TECHNIQUE: Gray-scale sonography with  graded compression, as well as color Doppler and duplex ultrasound were performed to evaluate the lower extremity deep venous systems from the level of the common femoral vein and including the common femoral, femoral, profunda femoral, popliteal and calf veins including the posterior tibial, peroneal and gastrocnemius veins when visible. The superficial great saphenous vein was also interrogated. Spectral Doppler was utilized to evaluate flow at rest and with distal augmentation maneuvers in the common femoral, femoral and popliteal veins. COMPARISON:  Left lower extremity venous Doppler ultrasound -02/24/2016 FINDINGS: Contralateral Common Femoral Vein: Respiratory phasicity is normal and symmetric with the symptomatic side. No evidence of thrombus. Normal compressibility. There is mixed echogenic nonocclusive thrombus within the left femoral vein (representative images 6 and 7), extending to involve the saphenofemoral junction (image 11). The cranial aspect of the greater saphenous vein appears patent where imaged. There is hypoechoic nonocclusive thrombus throughout the proximal (image 16), mid (image 19) and distal (image 22) aspects of the left femoral vein. The popliteal vein appears patent where imaged. There is hypoechoic expansile occlusive thrombus within the imaged portions of the left peroneal vein (representative image 33). The posterior tibial vein appears patent were imaged. Other Findings: There is a minimal amount of  subcutaneous edema at the level of the left lower leg and calf. IMPRESSION: The examination is positive for nonocclusive thrombus within the left common and superficial femoral veins. Additionally, there is occlusive thrombus within the left peroneal vein. These findings are new compared to the 02/24/2016 examination. These results will be called to the ordering clinician or representative by the Radiologist Assistant, and communication documented in the PACS or zVision Dashboard. Electronically Signed   By: Sandi Mariscal M.D.   On: 03/29/2016 12:01   US Paracentesis  04/12/2016  INDICATION: Metastatic pancreatic cancer with sepsis. Request for diagnostic and therapeutic paracentesis. EXAM: ULTRASOUND GUIDED PARACENTESIS MEDICATIONS: None. COMPLICATIONS: None immediate. PROCEDURE: Informed written consent was obtained from the patient after a discussion of the risks, benefits and alternatives to treatment. A timeout was performed prior to the initiation of the procedure. Initial ultrasound scanning demonstrates a large amount of ascites within the right lower abdominal quadrant. The right lower abdomen was prepped and draped in the usual sterile fashion. 1% lidocaine with epinephrine was used for local anesthesia. Following this, a 6 Fr Safe-T-Centesis catheter was introduced. An ultrasound image was saved for documentation purposes. The paracentesis was performed. The catheter was removed and a dressing was applied. The patient tolerated the procedure well without immediate post procedural complication. FINDINGS: A total of approximately 3 L of amber colored fluid was removed. Samples were sent to the laboratory as requested by the clinical team. IMPRESSION: Successful ultrasound-guided paracentesis yielding 3 L liters of peritoneal fluid. Electronically Signed   By: Markus Daft M.D.   On: 04/12/2016 15:25   Dg Chest Port 1 View  04/10/2016  CLINICAL DATA:  LEFT lower quadrant pain EXAM: PORTABLE CHEST 1 VIEW  COMPARISON:  CT chest 03/28/2016 FINDINGS: Normal cardiac silhouette. Mild nodular airspace disease in the RIGHT lung again demonstrated. Exam is lordotic. Low lung volumes. No pulmonary edema or pneumothorax IMPRESSION: Nodular airspace disease in the RIGHT lung not changed from prior. No change from CT of 03/28/2016 Electronically Signed   By: Suzy Bouchard M.D.   On: 04/10/2016 16:26       Today   Subjective:   Cody Andrade  states his pain is under control. Denies any chest pains  Objective:   Blood pressure 112/69,  pulse 100, temperature 97.6 F (36.4 C), temperature source Oral, resp. rate 20, height 5\' 11"  (1.803 m), weight 78.019 kg (172 lb), SpO2 96 %.  .  Intake/Output Summary (Last 24 hours) at 04/15/16 1320 Last data filed at 04/15/16 0944  Gross per 24 hour  Intake     50 ml  Output    200 ml  Net   -150 ml    Exam VITAL SIGNS: Blood pressure 112/69, pulse 100, temperature 97.6 F (36.4 C), temperature source Oral, resp. rate 20, height 5\' 11"  (1.803 m), weight 78.019 kg (172 lb), SpO2 96 %.  GENERAL:  82 y.o.-year-old patient lying in the bed with no acute distress.  EYES: Pupils equal, round, reactive to light and accommodation. No scleral icterus. Extraocular muscles intact.  HEENT: Head atraumatic, normocephalic. Oropharynx and nasopharynx clear.  NECK:  Supple, no jugular venous distention. No thyroid enlargement, no tenderness.  LUNGS: Normal breath sounds bilaterally, no wheezing, rales,rhonchi or crepitation. No use of accessory muscles of respiration.  CARDIOVASCULAR: S1, S2 normal. No murmurs, rubs, or gallops.  ABDOMEN: Soft, nontender, nondistended. Bowel sounds present. No organomegaly or mass.  EXTREMITIES: No pedal edema, cyanosis, or clubbing.  NEUROLOGIC: Cranial nerves II through XII are intact. Muscle strength 5/5 in all extremities. Sensation intact. Gait not checked.  PSYCHIATRIC: The patient is alert and oriented x 3.  SKIN: No obvious  rash, lesion, or ulcer.   Data Review     CBC w Diff: Lab Results  Component Value Date   WBC 16.0* 04/13/2016   WBC 9.1 05/08/2013   HGB 8.5* 04/13/2016   HGB 12.9* 05/08/2013   HCT 26.3* 04/13/2016   HCT 38.2* 05/08/2013   PLT 190 04/13/2016   PLT 147* 05/08/2013   LYMPHOPCT 2% 04/10/2016   MONOPCT 7% 04/10/2016   EOSPCT 0% 04/10/2016   BASOPCT 1% 04/10/2016   CMP: Lab Results  Component Value Date   NA 136 04/13/2016   NA 142 05/08/2013   K 3.6 04/13/2016   K 3.9 05/08/2013   CL 105 04/13/2016   CL 110* 05/08/2013   CO2 21* 04/13/2016   CO2 28 05/08/2013   BUN 39* 04/13/2016   BUN 23* 05/08/2013   CREATININE 1.27* 04/13/2016   CREATININE 0.87 05/08/2013   PROT 5.3* 04/12/2016   PROT 6.6 05/08/2013   ALBUMIN 1.2* 04/10/2016   ALBUMIN 3.3* 05/08/2013   BILITOT 1.0 04/10/2016   BILITOT 0.4 05/08/2013   ALKPHOS 245* 04/10/2016   ALKPHOS 49* 05/08/2013   AST 29 04/10/2016   AST 20 05/08/2013   ALT 30 04/10/2016   ALT 22 05/08/2013  .  Micro Results Recent Results (from the past 240 hour(s))  Blood culture (routine x 2)     Status: Abnormal   Collection Time: 04/10/16  4:13 PM  Result Value Ref Range Status   Specimen Description BLOOD RIGHT FATTY CASTS  Final   Special Requests BOTTLES DRAWN AEROBIC AND ANAEROBIC 2CCAERO,1CCANA  Final   Culture  Setup Time   Final    GRAM NEGATIVE RODS AEROBIC BOTTLE ONLY CRITICAL RESULT CALLED TO, READ BACK BY AND VERIFIED WITH: CHRISTINA KATSOUDAS ON 04/11/16 AT 0901 BY QSD    Culture ESCHERICHIA COLI AEROBIC BOTTLE ONLY  (A)  Final   Report Status 04/15/2016 FINAL  Final   Organism ID, Bacteria ESCHERICHIA COLI  Final      Susceptibility   Escherichia coli - MIC*    AMPICILLIN >=32 RESISTANT Resistant     CEFAZOLIN >=64  RESISTANT Resistant     CEFEPIME <=1 SENSITIVE Sensitive     CEFTAZIDIME <=1 SENSITIVE Sensitive     CEFTRIAXONE <=1 SENSITIVE Sensitive     CIPROFLOXACIN >=4 RESISTANT Resistant     GENTAMICIN  <=1 SENSITIVE Sensitive     IMIPENEM <=0.25 SENSITIVE Sensitive     TRIMETH/SULFA <=20 SENSITIVE Sensitive     AMPICILLIN/SULBACTAM >=32 RESISTANT Resistant     PIP/TAZO 64 INTERMEDIATE Intermediate     Extended ESBL NEGATIVE Sensitive     * ESCHERICHIA COLI  Blood Culture ID Panel (Reflexed)     Status: Abnormal   Collection Time: 04/10/16  4:13 PM  Result Value Ref Range Status   Enterococcus species NOT DETECTED NOT DETECTED Final   Vancomycin resistance NOT DETECTED NOT DETECTED Final   Listeria monocytogenes NOT DETECTED NOT DETECTED Final   Staphylococcus species NOT DETECTED NOT DETECTED Final   Staphylococcus aureus NOT DETECTED NOT DETECTED Final   Methicillin resistance NOT DETECTED NOT DETECTED Final   Streptococcus species NOT DETECTED NOT DETECTED Final   Streptococcus agalactiae NOT DETECTED NOT DETECTED Final   Streptococcus pneumoniae NOT DETECTED NOT DETECTED Final   Streptococcus pyogenes NOT DETECTED NOT DETECTED Final   Acinetobacter baumannii NOT DETECTED NOT DETECTED Final   Enterobacteriaceae species DETECTED (A) NOT DETECTED Final    Comment: CRITICAL RESULT CALLED TO, READ BACK BY AND VERIFIED WITH: CHRISTINA KATSOUDAS ON 04/11/16 AT 0901 BY QSD    Enterobacter cloacae complex NOT DETECTED NOT DETECTED Final   Escherichia coli DETECTED (A) NOT DETECTED Final    Comment: CRITICAL RESULT CALLED TO, READ BACK BY AND VERIFIED WITH: CHRISTINA KATSOUDAS ON 04/11/16 AT 0901 BY QSD    Klebsiella oxytoca NOT DETECTED NOT DETECTED Final   Klebsiella pneumoniae NOT DETECTED NOT DETECTED Final   Proteus species NOT DETECTED NOT DETECTED Final   Serratia marcescens NOT DETECTED NOT DETECTED Final   Carbapenem resistance NOT DETECTED NOT DETECTED Final   Haemophilus influenzae NOT DETECTED NOT DETECTED Final   Neisseria meningitidis NOT DETECTED NOT DETECTED Final   Pseudomonas aeruginosa NOT DETECTED NOT DETECTED Final   Candida albicans NOT DETECTED NOT DETECTED Final    Candida glabrata NOT DETECTED NOT DETECTED Final   Candida krusei NOT DETECTED NOT DETECTED Final   Candida parapsilosis NOT DETECTED NOT DETECTED Final   Candida tropicalis NOT DETECTED NOT DETECTED Final  Blood culture (routine x 2)     Status: None   Collection Time: 04/10/16  4:14 PM  Result Value Ref Range Status   Specimen Description BLOOD LEFT ASSIST CONTROL  Final   Special Requests BOTTLES DRAWN AEROBIC AND ANAEROBIC [place]  Final   Culture NO GROWTH 5 DAYS  Final   Report Status 04/15/2016 FINAL  Final  Urine culture     Status: None   Collection Time: 04/11/16  9:35 AM  Result Value Ref Range Status   Specimen Description URINE, RANDOM  Final   Special Requests NONE  Final   Culture NO GROWTH 2 DAYS  Final   Report Status 04/13/2016 FINAL  Final  CULTURE, BLOOD (ROUTINE X 2) w Reflex to PCR ID Panel     Status: None (Preliminary result)   Collection Time: 04/12/16 12:33 PM  Result Value Ref Range Status   Specimen Description BLOOD LEFT WRIST  Final   Special Requests BOTTLES DRAWN AEROBIC AND ANAEROBIC  1CC  Final   Culture NO GROWTH 3 DAYS  Final   Report Status PENDING  Incomplete  Culture,  body fluid-bottle     Status: None (Preliminary result)   Collection Time: 04/12/16  2:25 PM  Result Value Ref Range Status   Specimen Description ASCITIC ABDOMINAL FLUID  Final   Special Requests NONE  Final   Culture NO GROWTH 3 DAYS  Final   Report Status PENDING  Incomplete  CULTURE, BLOOD (ROUTINE X 2) w Reflex to PCR ID Panel     Status: None (Preliminary result)   Collection Time: 04/12/16  3:26 PM  Result Value Ref Range Status   Specimen Description BLOOD LEFT HAND  Final   Special Requests BOTTLES DRAWN AEROBIC AND ANAEROBIC 2CCAERO,1CCANA  Final   Culture NO GROWTH 3 DAYS  Final   Report Status PENDING  Incomplete        Code Status Orders        Start     Ordered   04/10/16 2002  Full code   Continuous     04/10/16 2001    Code Status  History    Date Active Date Inactive Code Status Order ID Comments User Context   04/04/2016 11:59 AM 04/06/2016  5:01 PM Full Code [id number]  Lytle Butte, MD ED   02/09/2016 12:32 AM 02/11/2016  4:52 PM Full Code [id number]  Fritzi Mandes, MD Inpatient    Advance Directive Documentation        Most Recent Value   Type of Advance Directive  Healthcare Power of [place], Living will [POA: Rexene Alberts (spouse)]   Pre-existing out of facility DNR order (yellow form or pink MOST form)     "MOST" Form in Place?            Follow-up Information    Follow up with Lequita Asal, MD In 7 days.   Specialty:  Hematology and Oncology   Contact information:   [place] [address] [phone]       Discharge Medications     Medication List    STOP taking these medications        Rivaroxaban 15 MG Tabs tablet  Commonly known as:  XARELTO      TAKE these medications        aspirin 325 MG tablet  Take 1 tablet (325 mg total) by mouth daily.     atorvastatin 80 MG tablet  Commonly known as:  LIPITOR  Take 1 tablet (80 mg total) by mouth daily at 6 PM.     docusate sodium 100 MG capsule  Commonly known as:  COLACE  Take 100 mg by mouth 2 (two) times daily as needed for mild constipation.     enoxaparin 150 MG/ML injection  Commonly known as:  LOVENOX  Inject 0.74 mLs (110 mg total) into the skin daily.     feeding supplement (ENSURE ENLIVE) Liqd  Take 237 mLs by mouth 2 (two) times daily between meals.     fentaNYL 12 MCG/HR  Commonly known as:  DURAGESIC - dosed mcg/hr  Place 1 patch (12.5 mcg total) onto the skin every 3 (three) days.     lidocaine-prilocaine cream  Commonly known as:  EMLA  Apply 1 application topically as needed (prior to accessing port).     magnesium hydroxide 400 MG/5ML suspension  Commonly known as:  MILK OF MAGNESIA  Take 30 mLs by mouth daily as needed for mild constipation.     megestrol 400 MG/10ML  suspension  Commonly known as:  MEGACE  Take 5 mLs (200 mg  total) by mouth daily.     methimazole 10 MG tablet  Commonly known as:  TAPAZOLE  Take 5 mg by mouth daily.     mirtazapine 30 MG tablet  Commonly known as:  REMERON  Take 1 tablet (30 mg total) by mouth at bedtime.     ondansetron 4 MG disintegrating tablet  Commonly known as:  ZOFRAN-ODT  Take 1 tablet (4 mg total) by mouth every 6 (six) hours as needed for nausea. Reported on 02/14/2016     oxyCODONE 5 MG immediate release tablet  Commonly known as:  Oxy IR/ROXICODONE  Take 1 tablet (5 mg total) by mouth every 4 (four) hours as needed for moderate pain.     polyethylene glycol packet  Commonly known as:  MIRALAX / GLYCOLAX  Take 17 g by mouth daily.     senna 8.6 MG Tabs tablet  Commonly known as:  SENOKOT  Take 2 tablets (17.2 mg total) by mouth daily.     simethicone 80 MG chewable tablet  Commonly known as:  MYLICON  Chew 80 mg by mouth every 6 (six) hours as needed for flatulence.     zolpidem 5 MG tablet  Commonly known as:  AMBIEN  Take 1 tablet (5 mg total) by mouth at bedtime as needed for sleep.           Total Time in preparing paper work, data evaluation and todays exam - 35 minutes  Dustin Flock M.D on 04/15/2016 at 1:20 PM  [facility]   Office  [phone]

## 2016-04-15 NOTE — Progress Notes (Signed)
New referral for [facility] services at home following discharge. Cody Andrade is an 82 year old man with a known history metastatic pancreatic cancer, left leg DVT, CVA, HTN, diverticulitis and GERD who was admitted to [facility] on 5/3 for evaluation of abdominal pain/sepsis. He has received IV antibiotics and undergone a paracentesis with 3 liters removed on 5/5. Per chart note review his poor prognosis has been discussed with he and his family and the plan is for him to discharge home with hospice services. Writer met in the patient's room with Cody Andrade, his wife Cody Andrade and daughter Cody Andrade present at bedside. Telephone conversation was had with his wife prior as well. Education initiated regarding hospice services, philosophy and team approach to care with good understanding voiced.  DME requested include a hospital bed, BSC and oxygen. Patient needs this DME in place prior to discharge, it has been requested to be delivered ASAP. Mrs. Oberman does not feel she can take her husband home this evening. She would like to get both the equipment and more in home care arranged. CMRN Hassan Rowan made aware. Updated patient information faxed to referral intake. Patient may need EMS transport at discharge d/t his need for oxygen and weakened state. Patient and family are agreeable. Thank you for the opportunity to be involved in the care of this patient and his family. Flo Shanks RN, BSN, [facility] [place], hospital Liaison [phone] c

## 2016-04-15 NOTE — Care Management Important Message (Signed)
Important Message  Patient Details  Name: Cody Andrade MRN: [id number] Date of Birth: 07-16-1933   Medicare Important Message Given:  Yes    [name] 04/15/2016, 10:41 AM

## 2016-04-15 NOTE — Progress Notes (Signed)
Notified Dr. Posey Pronto via telephone that home equipment cant be delivered until tomorrow, per MD okay to d/c discharge order.

## 2016-04-16 ENCOUNTER — Inpatient Hospital Stay: Payer: Medicare Other

## 2016-04-16 LAB — CREATININE, SERUM
Creatinine, Ser: 1.21 mg/dL (ref 0.61–1.24)
GFR calc Af Amer: >60 mL/min (ref >60)
GFR, EST NON AFRICAN AMERICAN: 55 mL/min — AB (ref >60)

## 2016-04-16 LAB — CULTURE, BODY FLUID W GRAM STAIN -BOTTLE: Culture: NO GROWTH

## 2016-04-16 LAB — CULTURE, BODY FLUID-BOTTLE

## 2016-04-16 LAB — CBC
HEMATOCRIT: 27.5 % — AB (ref 40.0–52.0)
HEMOGLOBIN: 8.9 g/dL — AB (ref 13.0–18.0)
MCH: 29.6 pg (ref 26.0–34.0)
MCHC: 32.4 g/dL (ref 32.0–36.0)
MCV: 91.5 fL (ref 80.0–100.0)
Platelets: 217 10*3/uL (ref 150–440)
RBC: 3.01 MIL/uL — AB (ref 4.40–5.90)
RDW: 17.8 % — ABNORMAL HIGH (ref 11.5–14.5)
WBC: 13.9 10*3/uL — ABNORMAL HIGH (ref 3.8–10.6)

## 2016-04-16 MED ORDER — FLEET ENEMA 7-19 GM/118ML RE ENEM
1.0000 | ENEMA | Freq: Once | RECTAL | Status: AC
  Administered 2016-04-16: 10:00:00 1 via RECTAL

## 2016-04-16 NOTE — Progress Notes (Signed)
Follow up visit made to new referral for hospice services at home following discharge. Patient seen sitting up in bed, alert. Daughter Cody Andrade at bedside. She confirmed that the DME has been delivered to patient's home.  Appetite remains poor, bites and sips. Staff have also noticed a significant decline in patient's functional and mental status since Saturday. Per chart review patient has not had a bowel movement in 7 days, Staff RN Velna Hatchet has addressed with attending physician Dr. Posey Pronto. Plan remains at for discharge home today after a paracentesis.  Family does want EMS transport at discharge. CMRN Hassan Rowan aware. Writer spoke in the family room with patient's daughter Cody Andrade, discussed signs and symptoms of decline. Emotional support given. "Gone from My sight" booklet shared with Cody Andrade. CMRN Hassan Rowan and Staff RN Brandi made aware.  He remains a Full Code. Thank you. Flo Shanks RN, BSN, [place] [facility] [place], [facility] [phone] c

## 2016-04-16 NOTE — Progress Notes (Signed)
Patient ID: Cody Andrade, male   DOB: 16-Sep-1933, 82 y.o.   MRN: [id number] [facility] PROGRESS NOTE  Cody Andrade [id number] DOB: 11/13/1933 DOA: 04/10/2016 PCP: No PCP Per Patient  HPI/Subjective: Patient feeling better states abdominal pain is under control   Objective: Filed Vitals:   04/16/16 1111 04/16/16 1140  BP: 130/72 119/68  Pulse: 106 98  Temp:    Resp: 20 18    Filed Weights   04/14/16 0500 04/15/16 0500 04/16/16 0622  Weight: 75.388 kg (166 lb 3.2 oz) 78.019 kg (172 lb) 75.615 kg (166 lb 11.2 oz)    ROS: Review of Systems  Constitutional: Negative for fever and chills.  Eyes: Negative for blurred vision.  Respiratory: Negative for cough and positiveshortness of breath.   Cardiovascular: Negative for chest pain.  Gastrointestinal: Persistent abdominal pain  distention improved. Negative for nausea, vomiting, diarrhea and constipation.  Genitourinary: Negative for dysuria.  Musculoskeletal: Negative for joint pain.  Neurological: Negative for dizziness and headaches.   Exam: Physical Exam  Constitutional: He is oriented to person, place, and time.  HENT:  Nose: No mucosal edema.  Mouth/Throat: No oropharyngeal exudate or posterior oropharyngeal edema.  Eyes: Conjunctivae, EOM and lids are normal. Pupils are equal, round, and reactive to light.  Neck: No JVD present. Carotid bruit is not present. No edema present. No thyroid mass and no thyromegaly present.  Cardiovascular: S1 normal and S2 normal.  Tachycardia present.  Exam reveals no gallop.   Murmur heard.  Systolic murmur is present with a grade of 2/6  Pulses:      Dorsalis pedis pulses are 2+ on the right side, and 2+ on the left side.  Respiratory: No respiratory distress. He has no wheezes. He has no rhonchi. He has no rales.  GI: Soft. Bowel sounds are normal. There is generalized tenderness.  Musculoskeletal:       Right ankle: He exhibits no swelling.       Left ankle: He exhibits  no swelling.  Lymphadenopathy:    He has no cervical adenopathy.  Neurological: He is alert and oriented to person, place, and time. No cranial nerve deficit.  Skin: Skin is warm. No rash noted. Nails show no clubbing.  Psychiatric: He has a normal mood and affect.      Data Reviewed: Basic Metabolic Panel:  Recent Labs Lab 04/10/16 1356 04/11/16 0408 04/12/16 0558 04/13/16 0538 04/16/16 0426  NA 132* 131* 134* 136  --   K 3.6 4.0 3.8 3.6  --   CL 98* 102 105 105  --   CO2 19* 18* 20* 21*  --   GLUCOSE 132* 150* 142* 107*  --   BUN 30* 34* 39* 39*  --   CREATININE 1.28* 1.31* 1.16 1.27* 1.21  CALCIUM 8.3* 7.6* 7.6* 7.7*  --    Liver Function Tests:  Recent Labs Lab 04/10/16 1613 04/12/16 1526  AST 29  --   ALT 30  --   ALKPHOS 245*  --   BILITOT 1.0  --   PROT <3.0* 5.3*  ALBUMIN 1.2*  --     Recent Labs Lab 04/10/16 1613  LIPASE 13   CBC:  Recent Labs Lab 04/10/16 1356 04/11/16 0408 04/12/16 0558 04/13/16 1202 04/16/16 0426  WBC 10.7* 14.0* 16.3* 16.0* 13.9*  NEUTROABS 9.7*  --   --   --   --   HGB 11.4* 9.5* 8.8* 8.5* 8.9*  HCT 35.4* 29.0* 26.6* 26.3* 27.5*  MCV 94.1  94.5 92.4 92.4 91.5  PLT 295 185 189 190 217   Cardiac Enzymes:  Recent Labs Lab 04/10/16 1613  TROPONINI <0.03     Recent Results (from the past 240 hour(s))  Blood culture (routine x 2)     Status: Abnormal   Collection Time: 04/10/16  4:13 PM  Result Value Ref Range Status   Specimen Description BLOOD RIGHT FATTY CASTS  Final   Special Requests BOTTLES DRAWN AEROBIC AND ANAEROBIC 2CCAERO,1CCANA  Final   Culture  Setup Time   Final    GRAM NEGATIVE RODS AEROBIC BOTTLE ONLY CRITICAL RESULT CALLED TO, READ BACK BY AND VERIFIED WITH: CHRISTINA KATSOUDAS ON 04/11/16 AT 0901 BY QSD    Culture ESCHERICHIA COLI AEROBIC BOTTLE ONLY  (A)  Final   Report Status 04/15/2016 FINAL  Final   Organism ID, Bacteria ESCHERICHIA COLI  Final      Susceptibility   Escherichia coli -  MIC*    AMPICILLIN >=32 RESISTANT Resistant     CEFAZOLIN >=64 RESISTANT Resistant     CEFEPIME <=1 SENSITIVE Sensitive     CEFTAZIDIME <=1 SENSITIVE Sensitive     CEFTRIAXONE <=1 SENSITIVE Sensitive     CIPROFLOXACIN >=4 RESISTANT Resistant     GENTAMICIN <=1 SENSITIVE Sensitive     IMIPENEM <=0.25 SENSITIVE Sensitive     TRIMETH/SULFA <=20 SENSITIVE Sensitive     AMPICILLIN/SULBACTAM >=32 RESISTANT Resistant     PIP/TAZO 64 INTERMEDIATE Intermediate     Extended ESBL NEGATIVE Sensitive     * ESCHERICHIA COLI  Blood Culture ID Panel (Reflexed)     Status: Abnormal   Collection Time: 04/10/16  4:13 PM  Result Value Ref Range Status   Enterococcus species NOT DETECTED NOT DETECTED Final   Vancomycin resistance NOT DETECTED NOT DETECTED Final   Listeria monocytogenes NOT DETECTED NOT DETECTED Final   Staphylococcus species NOT DETECTED NOT DETECTED Final   Staphylococcus aureus NOT DETECTED NOT DETECTED Final   Methicillin resistance NOT DETECTED NOT DETECTED Final   Streptococcus species NOT DETECTED NOT DETECTED Final   Streptococcus agalactiae NOT DETECTED NOT DETECTED Final   Streptococcus pneumoniae NOT DETECTED NOT DETECTED Final   Streptococcus pyogenes NOT DETECTED NOT DETECTED Final   Acinetobacter baumannii NOT DETECTED NOT DETECTED Final   Enterobacteriaceae species DETECTED (A) NOT DETECTED Final    Comment: CRITICAL RESULT CALLED TO, READ BACK BY AND VERIFIED WITH: CHRISTINA KATSOUDAS ON 04/11/16 AT 0901 BY QSD    Enterobacter cloacae complex NOT DETECTED NOT DETECTED Final   Escherichia coli DETECTED (A) NOT DETECTED Final    Comment: CRITICAL RESULT CALLED TO, READ BACK BY AND VERIFIED WITH: CHRISTINA KATSOUDAS ON 04/11/16 AT 0901 BY QSD    Klebsiella oxytoca NOT DETECTED NOT DETECTED Final   Klebsiella pneumoniae NOT DETECTED NOT DETECTED Final   Proteus species NOT DETECTED NOT DETECTED Final   Serratia marcescens NOT DETECTED NOT DETECTED Final   Carbapenem  resistance NOT DETECTED NOT DETECTED Final   Haemophilus influenzae NOT DETECTED NOT DETECTED Final   Neisseria meningitidis NOT DETECTED NOT DETECTED Final   Pseudomonas aeruginosa NOT DETECTED NOT DETECTED Final   Candida albicans NOT DETECTED NOT DETECTED Final   Candida glabrata NOT DETECTED NOT DETECTED Final   Candida krusei NOT DETECTED NOT DETECTED Final   Candida parapsilosis NOT DETECTED NOT DETECTED Final   Candida tropicalis NOT DETECTED NOT DETECTED Final  Blood culture (routine x 2)     Status: None   Collection Time: 04/10/16  4:14  PM  Result Value Ref Range Status   Specimen Description BLOOD LEFT ASSIST CONTROL  Final   Special Requests BOTTLES DRAWN AEROBIC AND ANAEROBIC [place]  Final   Culture NO GROWTH 5 DAYS  Final   Report Status 04/15/2016 FINAL  Final  Urine culture     Status: None   Collection Time: 04/11/16  9:35 AM  Result Value Ref Range Status   Specimen Description URINE, RANDOM  Final   Special Requests NONE  Final   Culture NO GROWTH 2 DAYS  Final   Report Status 04/13/2016 FINAL  Final  CULTURE, BLOOD (ROUTINE X 2) w Reflex to PCR ID Panel     Status: None (Preliminary result)   Collection Time: 04/12/16 12:33 PM  Result Value Ref Range Status   Specimen Description BLOOD LEFT WRIST  Final   Special Requests BOTTLES DRAWN AEROBIC AND ANAEROBIC  1CC  Final   Culture NO GROWTH 4 DAYS  Final   Report Status PENDING  Incomplete  Culture, body fluid-bottle     Status: None   Collection Time: 04/12/16  2:25 PM  Result Value Ref Range Status   Specimen Description ASCITIC ABDOMINAL FLUID  Final   Special Requests NONE  Final   Culture NO GROWTH 4 DAYS  Final   Report Status 04/16/2016 FINAL  Final  CULTURE, BLOOD (ROUTINE X 2) w Reflex to PCR ID Panel     Status: None (Preliminary result)   Collection Time: 04/12/16  3:26 PM  Result Value Ref Range Status   Specimen Description BLOOD LEFT HAND  Final   Special Requests BOTTLES DRAWN AEROBIC  AND ANAEROBIC 2CCAERO,1CCANA  Final   Culture NO GROWTH 4 DAYS  Final   Report Status PENDING  Incomplete     Studies: No results found.  Scheduled Meds: . atorvastatin  80 mg Oral q1800  . docusate sodium  100 mg Oral BID  . enoxaparin (LOVENOX) injection  1.5 mg/kg Subcutaneous Q24H  . feeding supplement (ENSURE ENLIVE)  237 mL Oral TID BM  . fentaNYL  12.5 mcg Transdermal Q72H  . megestrol  200 mg Oral Daily  . methimazole  5 mg Oral Daily  . polyethylene glycol  17 g Oral Daily  . scopolamine  1 patch Transdermal Q72H  . senna  2 tablet Oral Daily  . sodium phosphate  1 enema Rectal Once  . tamsulosin  0.4 mg Oral QPC breakfast   Continuous Infusions:    Assessment/Plan:  1. Clinical sepsis with Escherichia coli elated to metastatic pancreatic cancer, Continue IV antibiotics until ready to go home 2. Metastatic pancreatic cancer to liver, ascites.  Patient with progressive pain Seen by oncology  Patient still wants to be a full code In change pain control 3. Acute kidney injury resolved  4. Hyponatremia likely secondary to poor intake 5. New venous thrombosis left external iliac and left common femoral vein. Lovenox full dose 6.  New splenic lacerations. Monitor closely  Code Status:     Code Status Orders        Start     Ordered   04/10/16 2002  Full code   Continuous     04/10/16 2001    Code Status History    Date Active Date Inactive Code Status Order ID Comments User Context   04/04/2016 11:59 AM 04/06/2016  5:01 PM Full Code [id number]  Lytle Butte, MD ED   02/09/2016 12:32 AM 02/11/2016  4:52 PM Full Code [id number]  Fritzi Mandes,  MD Inpatient    Advance Directive Documentation        Most Recent Value   Type of Advance Directive  Healthcare Power of [place], Living will [POA: Jacobo Cepero (spouse)]   Pre-existing out of facility DNR order (yellow form or pink MOST form)     "MOST" Form in Place?       Family Communication: Discussed with the  patient Disposition Plan: To be determined  Consultants:  Oncology  Antibiotics:  Zosyn  Time spent: 25 minutes  [place], [facility]            Patient ID: Cody Andrade, male   DOB: [birth year], 82 y.o.   MRN: [id number] [facility] PROGRESS NOTE  Cody Andrade [id number] DOB: [birth year] DOA: 04/10/2016 PCP: No PCP Per Patient  HPI/Subjective: States his abdomen pain is little better today. Started on Duragesic patch   Objective: Filed Vitals:   04/16/16 1111 04/16/16 1140  BP: 130/72 119/68  Pulse: 106 98  Temp:    Resp: 20 18    Filed Weights   04/14/16 0500 04/15/16 0500 04/16/16 0622  Weight: 75.388 kg (166 lb 3.2 oz) 78.019 kg (172 lb) 75.615 kg (166 lb 11.2 oz)    ROS: Review of Systems  Constitutional: Negative for fever and chills.  Eyes: Negative for blurred vision.  Respiratory: Negative for cough and positiveshortness of breath.   Cardiovascular: Negative for chest pain.  Gastrointestinal: Persistent abdominal pain  distention improved. Negative for nausea, vomiting, diarrhea and constipation.  Genitourinary: Negative for dysuria.  Musculoskeletal: Negative for joint pain.  Neurological: Negative for dizziness and headaches.   Exam: Physical Exam  Constitutional: He is oriented to person, place, and time.  HENT:  Nose: No mucosal edema.  Mouth/Throat: No oropharyngeal exudate or posterior oropharyngeal edema.  Eyes: Conjunctivae, EOM and lids are normal. Pupils are equal, round, and reactive to light.  Neck: No JVD present. Carotid bruit is not present. No edema present. No thyroid mass and no thyromegaly present.  Cardiovascular: S1 normal and S2 normal.  Tachycardia present.  Exam reveals no gallop.   Murmur heard.  Systolic murmur is present with a grade of 2/6  Pulses:      Dorsalis pedis pulses are 2+ on the right side, and 2+ on the left side.  Respiratory: No respiratory distress. He has no wheezes. He  has no rhonchi. He has no rales.  GI: Soft. Bowel sounds are normal. There is generalized tenderness.  Musculoskeletal:       Right ankle: He exhibits no swelling.       Left ankle: He exhibits no swelling.  Lymphadenopathy:    He has no cervical adenopathy.  Neurological: He is alert and oriented to person, place, and time. No cranial nerve deficit.  Skin: Skin is warm. No rash noted. Nails show no clubbing.  Psychiatric: He has a normal mood and affect.      Data Reviewed: Basic Metabolic Panel:  Recent Labs Lab 04/10/16 1356 04/11/16 0408 04/12/16 0558 04/13/16 0538 04/16/16 0426  NA 132* 131* 134* 136  --   K 3.6 4.0 3.8 3.6  --   CL 98* 102 105 105  --   CO2 19* 18* 20* 21*  --   GLUCOSE 132* 150* 142* 107*  --   BUN 30* 34* 39* 39*  --   CREATININE 1.28* 1.31* 1.16 1.27* 1.21  CALCIUM 8.3* 7.6* 7.6* 7.7*  --    Liver Function Tests:  Recent Labs Lab 04/10/16 1613 04/12/16 1526  AST 29  --   ALT 30  --   ALKPHOS 245*  --   BILITOT 1.0  --   PROT <3.0* 5.3*  ALBUMIN 1.2*  --     Recent Labs Lab 04/10/16 1613  LIPASE 13   CBC:  Recent Labs Lab 04/10/16 1356 04/11/16 0408 04/12/16 0558 04/13/16 1202 04/16/16 0426  WBC 10.7* 14.0* 16.3* 16.0* 13.9*  NEUTROABS 9.7*  --   --   --   --   HGB 11.4* 9.5* 8.8* 8.5* 8.9*  HCT 35.4* 29.0* 26.6* 26.3* 27.5*  MCV 94.1 94.5 92.4 92.4 91.5  PLT 295 185 189 190 217   Cardiac Enzymes:  Recent Labs Lab 04/10/16 1613  TROPONINI <0.03     Recent Results (from the past 240 hour(s))  Blood culture (routine x 2)     Status: Abnormal   Collection Time: 04/10/16  4:13 PM  Result Value Ref Range Status   Specimen Description BLOOD RIGHT FATTY CASTS  Final   Special Requests BOTTLES DRAWN AEROBIC AND ANAEROBIC 2CCAERO,1CCANA  Final   Culture  Setup Time   Final    GRAM NEGATIVE RODS AEROBIC BOTTLE ONLY CRITICAL RESULT CALLED TO, READ BACK BY AND VERIFIED WITH: CHRISTINA KATSOUDAS ON 04/11/16 AT 0901 BY  QSD    Culture ESCHERICHIA COLI AEROBIC BOTTLE ONLY  (A)  Final   Report Status 04/15/2016 FINAL  Final   Organism ID, Bacteria ESCHERICHIA COLI  Final      Susceptibility   Escherichia coli - MIC*    AMPICILLIN >=32 RESISTANT Resistant     CEFAZOLIN >=64 RESISTANT Resistant     CEFEPIME <=1 SENSITIVE Sensitive     CEFTAZIDIME <=1 SENSITIVE Sensitive     CEFTRIAXONE <=1 SENSITIVE Sensitive     CIPROFLOXACIN >=4 RESISTANT Resistant     GENTAMICIN <=1 SENSITIVE Sensitive     IMIPENEM <=0.25 SENSITIVE Sensitive     TRIMETH/SULFA <=20 SENSITIVE Sensitive     AMPICILLIN/SULBACTAM >=32 RESISTANT Resistant     PIP/TAZO 64 INTERMEDIATE Intermediate     Extended ESBL NEGATIVE Sensitive     * ESCHERICHIA COLI  Blood Culture ID Panel (Reflexed)     Status: Abnormal   Collection Time: 04/10/16  4:13 PM  Result Value Ref Range Status   Enterococcus species NOT DETECTED NOT DETECTED Final   Vancomycin resistance NOT DETECTED NOT DETECTED Final   Listeria monocytogenes NOT DETECTED NOT DETECTED Final   Staphylococcus species NOT DETECTED NOT DETECTED Final   Staphylococcus aureus NOT DETECTED NOT DETECTED Final   Methicillin resistance NOT DETECTED NOT DETECTED Final   Streptococcus species NOT DETECTED NOT DETECTED Final   Streptococcus agalactiae NOT DETECTED NOT DETECTED Final   Streptococcus pneumoniae NOT DETECTED NOT DETECTED Final   Streptococcus pyogenes NOT DETECTED NOT DETECTED Final   Acinetobacter baumannii NOT DETECTED NOT DETECTED Final   Enterobacteriaceae species DETECTED (A) NOT DETECTED Final    Comment: CRITICAL RESULT CALLED TO, READ BACK BY AND VERIFIED WITH: CHRISTINA KATSOUDAS ON 04/11/16 AT 0901 BY QSD    Enterobacter cloacae complex NOT DETECTED NOT DETECTED Final   Escherichia coli DETECTED (A) NOT DETECTED Final    Comment: CRITICAL RESULT CALLED TO, READ BACK BY AND VERIFIED WITH: CHRISTINA KATSOUDAS ON 04/11/16 AT 0901 BY QSD    Klebsiella oxytoca NOT DETECTED  NOT DETECTED Final   Klebsiella pneumoniae NOT DETECTED NOT DETECTED Final   Proteus species NOT DETECTED NOT DETECTED Final  Serratia marcescens NOT DETECTED NOT DETECTED Final   Carbapenem resistance NOT DETECTED NOT DETECTED Final   Haemophilus influenzae NOT DETECTED NOT DETECTED Final   Neisseria meningitidis NOT DETECTED NOT DETECTED Final   Pseudomonas aeruginosa NOT DETECTED NOT DETECTED Final   Candida albicans NOT DETECTED NOT DETECTED Final   Candida glabrata NOT DETECTED NOT DETECTED Final   Candida krusei NOT DETECTED NOT DETECTED Final   Candida parapsilosis NOT DETECTED NOT DETECTED Final   Candida tropicalis NOT DETECTED NOT DETECTED Final  Blood culture (routine x 2)     Status: None   Collection Time: 04/10/16  4:14 PM  Result Value Ref Range Status   Specimen Description BLOOD LEFT ASSIST CONTROL  Final   Special Requests BOTTLES DRAWN AEROBIC AND ANAEROBIC [place]  Final   Culture NO GROWTH 5 DAYS  Final   Report Status 04/15/2016 FINAL  Final  Urine culture     Status: None   Collection Time: 04/11/16  9:35 AM  Result Value Ref Range Status   Specimen Description URINE, RANDOM  Final   Special Requests NONE  Final   Culture NO GROWTH 2 DAYS  Final   Report Status 04/13/2016 FINAL  Final  CULTURE, BLOOD (ROUTINE X 2) w Reflex to PCR ID Panel     Status: None (Preliminary result)   Collection Time: 04/12/16 12:33 PM  Result Value Ref Range Status   Specimen Description BLOOD LEFT WRIST  Final   Special Requests BOTTLES DRAWN AEROBIC AND ANAEROBIC  1CC  Final   Culture NO GROWTH 4 DAYS  Final   Report Status PENDING  Incomplete  Culture, body fluid-bottle     Status: None   Collection Time: 04/12/16  2:25 PM  Result Value Ref Range Status   Specimen Description ASCITIC ABDOMINAL FLUID  Final   Special Requests NONE  Final   Culture NO GROWTH 4 DAYS  Final   Report Status 04/16/2016 FINAL  Final  CULTURE, BLOOD (ROUTINE X 2) w Reflex to PCR ID Panel      Status: None (Preliminary result)   Collection Time: 04/12/16  3:26 PM  Result Value Ref Range Status   Specimen Description BLOOD LEFT HAND  Final   Special Requests BOTTLES DRAWN AEROBIC AND ANAEROBIC 2CCAERO,1CCANA  Final   Culture NO GROWTH 4 DAYS  Final   Report Status PENDING  Incomplete     Studies: No results found.  Scheduled Meds: . atorvastatin  80 mg Oral q1800  . docusate sodium  100 mg Oral BID  . enoxaparin (LOVENOX) injection  1.5 mg/kg Subcutaneous Q24H  . feeding supplement (ENSURE ENLIVE)  237 mL Oral TID BM  . fentaNYL  12.5 mcg Transdermal Q72H  . megestrol  200 mg Oral Daily  . methimazole  5 mg Oral Daily  . polyethylene glycol  17 g Oral Daily  . scopolamine  1 patch Transdermal Q72H  . senna  2 tablet Oral Daily  . sodium phosphate  1 enema Rectal Once  . tamsulosin  0.4 mg Oral QPC breakfast   Continuous Infusions:    Assessment/Plan:  7. Clinical sepsis with Escherichia coli elated to metastatic pancreatic cancer, on Rocephin 2 g IV recent blood cultures negative WBC continues to be elevated I suspect could be related to his underlying malignancy 8. Metastatic pancreatic cancer to liver, ascites.  Patient with progressive pain Seen by oncology  Patient still wants to be a full code Unfortunately this is not a curable cancer. 9. Acute  kidney injury resolved  10. Hyponatremia likely secondary to poor intake 11. New venous thrombosis left external iliac and left common femoral vein. On Lovenox full dose 12.  New splenic lacerations. Monitor closely  Code Status:     Code Status Orders        Start     Ordered   04/10/16 2002  Full code   Continuous     04/10/16 2001    Code Status History    Date Active Date Inactive Code Status Order ID Comments User Context   04/04/2016 11:59 AM 04/06/2016  5:01 PM Full Code [id number]  Lytle Butte, MD ED   02/09/2016 12:32 AM 02/11/2016  4:52 PM Full Code [id number]  Fritzi Mandes, MD Inpatient    Advance  Directive Documentation        Most Recent Value   Type of Advance Directive  Healthcare Power of [place], Living will [POA: Nolen [place] (spouse)]   Pre-existing out of facility DNR order (yellow form or pink MOST form)     "MOST" Form in Place?       Family Communication: Discussed with the patient Disposition Plan: To be determined  Consultants:  Oncology  Antibiotics:  Zosyn  Time spent: 25 minutes  [place], [facility]

## 2016-04-16 NOTE — Procedures (Signed)
Under US guidance, paracentesis was performed without complication. 

## 2016-04-16 NOTE — Discharge Summary (Signed)
Cody Andrade, 82 y.o., DOB [birth year], MRN [id number]. Admission date: 04/10/2016 Discharge Date 04/16/2016 Primary MD No PCP Per Patient Admitting Physician Demetrios Loll, MD  Admission Diagnosis  Abdominal pain, unspecified abdominal location [R10.9] Sepsis, due to unspecified organism [facility]) [A41.9]  Discharge Diagnosis   Principal Problem:   Sepsis ([place]) due to E.coli   Pancreatic cancer metastasized to liver [facility])   Pancreatic cancer metastasized to lung [facility])   Thrombosis of left lower extremity    Recent CVA   Acute kidney injury Hyponatremia Splenic laceration. Unclear cause       Hospital Course Cody Andrade is a 82 y.o. male with a known history of left leg DVT, CVA, pancreatitis cancer with metastasis, hypertension, diverticulitis and GERD. The patient presents to the ED with worsening abdominal pain and abdominal distention. Patient does have known history of pancreatic cancer. Also in the ER he was noted to have signs of sepsis. He was admitted for further evaluation and treatment. Initially was started on broad-spectrum antibiotics for sepsis. Subsequently his blood cultures did confirm Escherichia coli as a cause of the sepsis. Patient was continued on antibiotics. And subsequently changed to oral therapy. In terms of his abdominal pain. CT of the abdomen showed progression of his pancreatic cancer. Also had significant ascites which wasn't present in March. Further discussions were held with the patient and family regarding overall program poor prognosis with patient having pancreatic cancer. Patient continues to want to be a full code. His pain is controlled. Currently he is doing well and stable enough to be discharged home with adjustment of his pain medications as outpatient. Patient also had ascites in the abdomen which was drained due to symptomatic therapy. 3 L were removed.patient was not discharged yesterday due to hospital bed was not set up. Today patient's daughter  requesting another paracentesis. Prior to discharge. I have asked radiology to obtain ultrasound-guided paracentesis prior to discharge if he still has fluid.            Consults  hematology/oncology  Sig Tests:  See full reports for all details      Ct Angio Head W/cm &/or Wo Cm  04/04/2016  CLINICAL DATA:  82 year old male with left eye peripheral vision loss since waking today. Initial encounter. Pancreatic cancer. EXAM: CT ANGIOGRAPHY HEAD AND NECK TECHNIQUE: Multidetector CT imaging of the head and neck was performed using the standard protocol during bolus administration of intravenous contrast. Multiplanar CT image reconstructions and MIPs were obtained to evaluate the vascular anatomy. Carotid stenosis measurements (when applicable) are obtained utilizing NASCET criteria, using the distal internal carotid diameter as the denominator. CONTRAST:  75 mL Isovue 370. COMPARISON:  Head CT without contrast 1103 hours today. PET-CT 02/15/2016 FINDINGS: CTA NECK Skeleton: No acute osseous abnormality identified. Degenerative changes in the cervical spine with moderate to severe degenerative spinal stenosis that C5-C6. Other neck: Left chest porta cath, subclavian approach. Centrilobular and paraseptal emphysema. Apical lung scarring. At the level of the right hilum there is partially visible new peribronchial nodularity in spiculated opacity (series 4, image 11). There is fairly widespread fine nodular opacity in the right upper lobe which seems to be new since March. Prevascular superior mediastinal metastatic disease appears stable to mildly increased (series 4, image 68). Superimposed large heterogeneous thyroid goiter with superior mediastinal extension and dystrophic calcifications. Larynx, pharynx, parapharyngeal spaces, retropharyngeal space, sublingual space, submandibular glands, and parotid glands are within normal limits. Small metastatic nodes anterior to the left subclavian artery re-  demonstrated (series 4, image 91) an may be mildly progressed. Aortic arch: 3 vessel arch configuration. Moderate soft and calcified arch atherosclerosis. No great vessel origin stenosis. Right carotid system: Negative right CCA. Calcified plaque at the posterior right ICA and bulb. No right ICA origin stenosis. At the level of the distal bulb there is additional soft plaque, but subsequent stenosis is less than 50 % with respect to the distal vessel. See series 9, image 57). Right ICA remains patent to the skullbase. Left carotid system: No left CCA origin stenosis. Negative left CCA. Mild plaque at the left carotid bifurcation without stenosis. Negative cervical left ICA. Vertebral arteries:No proximal right subclavian artery stenosis despite plaque. Normal right vertebral artery origin. Mildly dominant right vertebral artery is normal to the skullbase. No proximal left subclavian artery stenosis despite plaque. Dense calcified plaque at the left vertebral artery origin resulting in severe left vertebral artery origin stenosis. Non dominant left vertebral artery remains patent and is otherwise negative to the skullbase. CTA HEAD Posterior circulation: Patent distal vertebral arteries, the right is dominant. Normal PICA origins. Normal vertebrobasilar junction. No basilar artery stenosis. Normal SCA and PCA origins. Right posterior communicating artery is present, the left is diminutive or absent. The distal left PCA branches appear occluded in the P3 division (series 10, image 23). Right PCA branches are within normal limits. Anterior circulation: Both ICA siphons are patent without significant plaque or stenosis. Both ophthalmic arteries appear to remain patent. Patent carotid termini. Normal MCA and ACA origins. Anterior communicating artery and bilateral ACA branches are within normal limits. Left MCA M1 segment, bifurcation, and left MCA branches are within normal limits. Right MCA M1 segment, bifurcation, and  right MCA branches are within normal limits. Venous sinuses: Patent. Anatomic variants: Dominant right vertebral artery. Delayed phase: No acute cortically based infarct identified, including in the left PCA territory. No abnormal enhancement identified. No midline shift, mass effect, or evidence of intracranial mass lesion. IMPRESSION: 1. Negative for emergent large vessel occlusion, but suggestion of occluded distal left PCA branches. However, there is no CT evidence of left PCA territory ischemia at this time. 2. Moderate to severe atherosclerotic stenosis of the left vertebral artery origin. Otherwise aortic arch and neck atherosclerosis without hemodynamically significant stenosis. 3. Abnormal right upper lobe nodular and spiculated opacities appear to be new since March. Top differential considerations in this setting include metastatic disease and acute respiratory infection. 4. Metastatic nodal disease at the left thoracic inlet and superior mediastinum. Electronically Signed   By: Genevie Ann M.D.   On: 04/04/2016 14:11   Dg Chest 2 View  04/13/2016  CLINICAL DATA:  Shortness of breath EXAM: CHEST  2 VIEW COMPARISON:  04/10/2016 FINDINGS: Stable positioning of left subclavian porta catheter. Layering pleural effusions and basilar atelectasis, as seen on 04/10/2016 abdominal CT. Hyperinflation attributed to COPD. Normal heart size. IMPRESSION: Small bilateral pleural effusion with basilar atelectasis. COPD Electronically Signed   By: Monte Fantasia M.D.   On: 04/13/2016 08:17   Ct Head Wo Contrast  04/04/2016  CLINICAL DATA:  Code stroke EXAM: CT HEAD WITHOUT CONTRAST TECHNIQUE: Contiguous axial images were obtained from the base of the skull through the vertex without intravenous contrast. COMPARISON:  05/08/2013 FINDINGS: No skull fracture is noted. Paranasal sinuses and mastoid air cells are unremarkable. Mild cerebral atrophy. Mild periventricular chronic white matter disease. No definite acute  cortical infarction. No mass lesion is noted on this unenhanced scan. Ventricular size is stable from prior exam.  IMPRESSION: No acute intracranial abnormality. Mild cerebral atrophy. Mild periventricular chronic white matter disease. No definite acute cortical infarction. These results were called by telephone at the time of interpretation on 04/04/2016 at 11:13 am to Dr. Delman Kitten , who verbally acknowledged these results. Electronically Signed   By: Lahoma Crocker M.D.   On: 04/04/2016 11:13   Ct Angio Neck W/cm &/or Wo/cm  04/04/2016  CLINICAL DATA:  82 year old male with left eye peripheral vision loss since waking today. Initial encounter. Pancreatic cancer. EXAM: CT ANGIOGRAPHY HEAD AND NECK TECHNIQUE: Multidetector CT imaging of the head and neck was performed using the standard protocol during bolus administration of intravenous contrast. Multiplanar CT image reconstructions and MIPs were obtained to evaluate the vascular anatomy. Carotid stenosis measurements (when applicable) are obtained utilizing NASCET criteria, using the distal internal carotid diameter as the denominator. CONTRAST:  75 mL Isovue 370. COMPARISON:  Head CT without contrast 1103 hours today. PET-CT 02/15/2016 FINDINGS: CTA NECK Skeleton: No acute osseous abnormality identified. Degenerative changes in the cervical spine with moderate to severe degenerative spinal stenosis that C5-C6. Other neck: Left chest porta cath, subclavian approach. Centrilobular and paraseptal emphysema. Apical lung scarring. At the level of the right hilum there is partially visible new peribronchial nodularity in spiculated opacity (series 4, image 11). There is fairly widespread fine nodular opacity in the right upper lobe which seems to be new since March. Prevascular superior mediastinal metastatic disease appears stable to mildly increased (series 4, image 68). Superimposed large heterogeneous thyroid goiter with superior mediastinal extension and dystrophic  calcifications. Larynx, pharynx, parapharyngeal spaces, retropharyngeal space, sublingual space, submandibular glands, and parotid glands are within normal limits. Small metastatic nodes anterior to the left subclavian artery re- demonstrated (series 4, image 91) an may be mildly progressed. Aortic arch: 3 vessel arch configuration. Moderate soft and calcified arch atherosclerosis. No great vessel origin stenosis. Right carotid system: Negative right CCA. Calcified plaque at the posterior right ICA and bulb. No right ICA origin stenosis. At the level of the distal bulb there is additional soft plaque, but subsequent stenosis is less than 50 % with respect to the distal vessel. See series 9, image 57). Right ICA remains patent to the skullbase. Left carotid system: No left CCA origin stenosis. Negative left CCA. Mild plaque at the left carotid bifurcation without stenosis. Negative cervical left ICA. Vertebral arteries:No proximal right subclavian artery stenosis despite plaque. Normal right vertebral artery origin. Mildly dominant right vertebral artery is normal to the skullbase. No proximal left subclavian artery stenosis despite plaque. Dense calcified plaque at the left vertebral artery origin resulting in severe left vertebral artery origin stenosis. Non dominant left vertebral artery remains patent and is otherwise negative to the skullbase. CTA HEAD Posterior circulation: Patent distal vertebral arteries, the right is dominant. Normal PICA origins. Normal vertebrobasilar junction. No basilar artery stenosis. Normal SCA and PCA origins. Right posterior communicating artery is present, the left is diminutive or absent. The distal left PCA branches appear occluded in the P3 division (series 10, image 23). Right PCA branches are within normal limits. Anterior circulation: Both ICA siphons are patent without significant plaque or stenosis. Both ophthalmic arteries appear to remain patent. Patent carotid termini.  Normal MCA and ACA origins. Anterior communicating artery and bilateral ACA branches are within normal limits. Left MCA M1 segment, bifurcation, and left MCA branches are within normal limits. Right MCA M1 segment, bifurcation, and right MCA branches are within normal limits. Venous sinuses: Patent. Anatomic variants:  Dominant right vertebral artery. Delayed phase: No acute cortically based infarct identified, including in the left PCA territory. No abnormal enhancement identified. No midline shift, mass effect, or evidence of intracranial mass lesion. IMPRESSION: 1. Negative for emergent large vessel occlusion, but suggestion of occluded distal left PCA branches. However, there is no CT evidence of left PCA territory ischemia at this time. 2. Moderate to severe atherosclerotic stenosis of the left vertebral artery origin. Otherwise aortic arch and neck atherosclerosis without hemodynamically significant stenosis. 3. Abnormal right upper lobe nodular and spiculated opacities appear to be new since March. Top differential considerations in this setting include metastatic disease and acute respiratory infection. 4. Metastatic nodal disease at the left thoracic inlet and superior mediastinum. Electronically Signed   By: Genevie Ann M.D.   On: 04/04/2016 14:11   Ct Angio Chest Pe W/cm &/or Wo Cm  03/28/2016  CLINICAL DATA:  Shortness of breath for several weeks with minimal exertion, history of pancreatic carcinoma with chemotherapy, some swelling of the left lower extremity EXAM: CT ANGIOGRAPHY CHEST WITH CONTRAST TECHNIQUE: Multidetector CT imaging of the chest was performed using the standard protocol during bolus administration of intravenous contrast. Multiplanar CT image reconstructions and MIPs were obtained to evaluate the vascular anatomy. CONTRAST:  75 cc Isovue 370 COMPARISON:  PET-CT of 02/15/2016 FINDINGS: The pulmonary arteries are relatively well opacified. There is no evidence of acute pulmonary embolism.  The thoracic aorta also opacifies moderately well with no acute abnormality. Atherosclerotic change i s noted throughout the aortic arch and descending thoracic aorta. Calcifications are noted in the distribution of the left main, left anterior descending, and circumflex coronary arteries. Mild cardiomegaly is present but no pericardial effusion is seen. There is some prominence of subcarinal nodes with short axis diameter of 12 mm on image 145. The thyroid gland is significantly enlarged with foci of calcification most consistent with thyroid goiter with some substernal extension. On lung window images and apical pleural-parenchymal scarring is present right much greater than left. There is nodularity throughout the lungs in a perilymphatic distribution. Also there is vague ground-glass opacities in the lower lobes. In addition, there are nodules scattered throughout the lungs primarily right-sided. The largest nodule is in the right upper lobe on image 63 measuring 9 x 20 mm. An adjacent 6 mm nodule is noted as well. A 9 mm nodule is present in the right lower lobe on image 81, with a 7 mm nodule in the right lower lobe on image 101 in addition to an 11 mm nodular opacity. Additional nodules are present bilaterally which are smaller. These findings are worrisome for diffuse metastatic involvement of the lungs with probable changes of lymphangitic carcinomatosis as well. This would be a very atypical presentation for an inflammatory or infectious process. No pleural effusion is seen. The central bronchi are patent. There are degenerative changes in the mid to lower thoracic spine. No lytic or blastic bony lesion is seen. Review of the MIP images confirms the above findings. IMPRESSION: 1. 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 2. No  evidence of acute pulmonary embolism. 3. Diffuse coronary artery calcifications. Electronically Signed   By: Ivar Drape M.D.   On: 03/28/2016 12:12   Mr Brain Wo Contrast  04/04/2016  CLINICAL DATA:  82 year old hypertensive male with pancreatic cancer presenting with left-sided visual loss. Subsequent encounter. EXAM:  MRI HEAD WITHOUT CONTRAST TECHNIQUE: Multiplanar, multiecho pulse sequences of the brain and surrounding structures were obtained without intravenous contrast. COMPARISON:  04/04/2016 CT angiogram and CT head.  2014 brain MR. FINDINGS: Acute nonhemorrhagic infarct medial right occipital lobe extending to posterior medial right temporal lobe. No associated hemorrhage. Slow flow within adjacent vessel. Tiny area blood breakdown products left periventricular region probably related to prior episode hemorrhagic ischemia. Minimal small vessel disease changes. Global atrophy without hydrocephalus. Major intracranial vascular structures are patent. No intracranial mass lesion noted on this unenhanced exam. No acute orbital abnormality. Cervical medullary junction unremarkable. Mild cervical spondylotic changes C3-4. 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 No associated hemorrhage. Electronically Signed   By: Genia Del M.D.   On: 04/04/2016 17:40   Ct Abdomen Pelvis W Contrast  04/10/2016  CLINICAL DATA:  Periumbilical pain which developed early this morning with nausea and vomiting. Metastatic pancreatic cancer. EXAM: CT ABDOMEN AND PELVIS WITH CONTRAST TECHNIQUE: Multidetector CT imaging of the abdomen and pelvis was performed using the standard protocol following bolus administration of intravenous contrast. CONTRAST:  14mL ISOVUE-300 IOPAMIDOL (ISOVUE-300) INJECTION 61% COMPARISON:  Abdominal CT dated 02/14/2016 and chest CT dated 03/28/2016 and PET-CT scan dated 02/15/2016 FINDINGS: Lower chest: New small bilateral  pleural effusions. Stable 5 mm nodule in the right middle lobe peripherally. Subtle new peripheral edema at the lung bases. The lungs are hyperinflated consistent emphysema. Hepatobiliary: Numerous metastatic lesions in the liver have progressed. There is new periportal edema. Biliary stent in place with air in the biliary tree. Pancreas: Pancreatic mass measures 3.3 x 2.5 cm, increased from 3.2 x 2.3 cm. Increased atrophy of the pancreatic tail with increased dilatation of the distal pancreatic duct. Spleen: There are 2 new linear areas of abnormal low density in the periphery of the superior aspect of the spleen consistent with splenic lacerations. The configuration is not suggestive of metastatic disease. There is no subcapsular hemorrhage. Adrenals/Urinary Tract: The adrenal glands and right kidney are normal. Benign 14 mm cyst in the upper pole of the left kidney and benign 23 mm cyst in the lower pole of the left kidney. No hydronephrosis. Bladder appears normal. Stomach/Bowel: Extensive diverticulosis of the left side of the colon. Diffuse edema of the colonic mucosa. Small bowel appears normal. Peripheral lacerations in the upper pole of the spleen without subcapsular hematoma. Vascular/Lymphatic: The left external iliac vein is enlarged as is the left common femoral vein and there appear to be thrombi in these vessels. This is not not definitive since mixing of unenhanced blood with enhance blood could give this appearance although the distention of the vein would be atypical and is new since the prior study. Aortic atherosclerosis. Reproductive: Normal. Other: Extensive new ascites since the CT scan of 02/14/2016. Increased ascites since the CT scan of 03/28/2016. Musculoskeletal: Multilevel degenerative facet arthritis in the lumbar spine. No acute abnormality. No visible bone metastases. IMPRESSION: 1. New small bilateral pleural effusions and subtle pulmonary edema. 2. Progressive metastatic disease in  the liver. 3. Progressive enlargement of the pancreatic cancer. 4. New splenic lacerations without a subcapsular hematoma or visible acute hemorrhage. 5. New probable venous thrombosis of the left external iliac vein and left common femoral vein. 6. Aortic atherosclerosis. 7. New extensive ascites since 02/14/2016, progressed since 03/28/2016 Electronically Signed   By: Lorriane Shire M.D.   On: 04/10/2016 16:01   US Venous Img Lower Unilateral Left  03/29/2016  CLINICAL  DATA:  Left lower extremity pain and swelling. History of pancreatic cancer. Evaluate for DVT. EXAM: LEFT LOWER EXTREMITY VENOUS DOPPLER ULTRASOUND TECHNIQUE: Gray-scale sonography with graded compression, as well as color Doppler and duplex ultrasound were performed to evaluate the lower extremity deep venous systems from the level of the common femoral vein and including the common femoral, femoral, profunda femoral, popliteal and calf veins including the posterior tibial, peroneal and gastrocnemius veins when visible. The superficial great saphenous vein was also interrogated. Spectral Doppler was utilized to evaluate flow at rest and with distal augmentation maneuvers in the common femoral, femoral and popliteal veins. COMPARISON:  Left lower extremity venous Doppler ultrasound -02/24/2016 FINDINGS: Contralateral Common Femoral Vein: Respiratory phasicity is normal and symmetric with the symptomatic side. No evidence of thrombus. Normal compressibility. There is mixed echogenic nonocclusive thrombus within the left femoral vein (representative images 6 and 7), extending to involve the saphenofemoral junction (image 11). The cranial aspect of the greater saphenous vein appears patent where imaged. There is hypoechoic nonocclusive thrombus throughout the proximal (image 16), mid (image 19) and distal (image 22) aspects of the left femoral vein. The popliteal vein appears patent where imaged. There is hypoechoic expansile occlusive thrombus  within the imaged portions of the left peroneal vein (representative image 33). The posterior tibial vein appears patent were imaged. Other Findings: There is a minimal amount of subcutaneous edema at the level of the left lower leg and calf. IMPRESSION: The examination is positive for nonocclusive thrombus within the left common and superficial femoral veins. Additionally, there is occlusive thrombus within the left peroneal vein. These findings are new compared to the 02/24/2016 examination. These results will be called to the ordering clinician or representative by the Radiologist Assistant, and communication documented in the PACS or zVision Dashboard. Electronically Signed   By: Sandi Mariscal M.D.   On: 03/29/2016 12:01   US Paracentesis  04/12/2016  INDICATION: Metastatic pancreatic cancer with sepsis. Request for diagnostic and therapeutic paracentesis. EXAM: ULTRASOUND GUIDED PARACENTESIS MEDICATIONS: None. COMPLICATIONS: None immediate. PROCEDURE: Informed written consent was obtained from the patient after a discussion of the risks, benefits and alternatives to treatment. A timeout was performed prior to the initiation of the procedure. Initial ultrasound scanning demonstrates a large amount of ascites within the right lower abdominal quadrant. The right lower abdomen was prepped and draped in the usual sterile fashion. 1% lidocaine with epinephrine was used for local anesthesia. Following this, a 6 Fr Safe-T-Centesis catheter was introduced. An ultrasound image was saved for documentation purposes. The paracentesis was performed. The catheter was removed and a dressing was applied. The patient tolerated the procedure well without immediate post procedural complication. FINDINGS: A total of approximately 3 L of amber colored fluid was removed. Samples were sent to the laboratory as requested by the clinical team. IMPRESSION: Successful ultrasound-guided paracentesis yielding 3 L liters of peritoneal fluid.  Electronically Signed   By: Markus Daft M.D.   On: 04/12/2016 15:25   Dg Chest Port 1 View  04/10/2016  CLINICAL DATA:  LEFT lower quadrant pain EXAM: PORTABLE CHEST 1 VIEW COMPARISON:  CT chest 03/28/2016 FINDINGS: Normal cardiac silhouette. Mild nodular airspace disease in the RIGHT lung again demonstrated. Exam is lordotic. Low lung volumes. No pulmonary edema or pneumothorax IMPRESSION: Nodular airspace disease in the RIGHT lung not changed from prior. No change from CT of 03/28/2016 Electronically Signed   By: Suzy Bouchard M.D.   On: 04/10/2016 16:26  Today   Subjective:   Cody Andrade  patient reports his pain is under control has not had a bowel movement in 7 days  Objective:   Blood pressure 119/68, pulse 98, temperature 98.6 F (37 C), temperature source Oral, resp. rate 18, height 5\' 11"  (1.803 m), weight 75.615 kg (166 lb 11.2 oz), SpO2 98 %.  .  Intake/Output Summary (Last 24 hours) at 04/16/16 1150 Last data filed at 04/16/16 0900  Gross per 24 hour  Intake      0 ml  Output      0 ml  Net      0 ml    Exam VITAL SIGNS: Blood pressure 119/68, pulse 98, temperature 98.6 F (37 C), temperature source Oral, resp. rate 18, height 5\' 11"  (1.803 m), weight 75.615 kg (166 lb 11.2 oz), SpO2 98 %.  GENERAL:  82 y.o.-year-old patient lying in the bed with no acute distress.  EYES: Pupils equal, round, reactive to light and accommodation. No scleral icterus. Extraocular muscles intact.  HEENT: Head atraumatic, normocephalic. Oropharynx and nasopharynx clear.  NECK:  Supple, no jugular venous distention. No thyroid enlargement, no tenderness.  LUNGS: Normal breath sounds bilaterally, no wheezing, rales,rhonchi or crepitation. No use of accessory muscles of respiration.  CARDIOVASCULAR: S1, S2 normal. No murmurs, rubs, or gallops.  ABDOMEN: Soft, nontender, nondistended. Bowel sounds present. No organomegaly or mass.  EXTREMITIES: No pedal edema, cyanosis, or clubbing.   NEUROLOGIC: Cranial nerves II through XII are intact. Muscle strength 5/5 in all extremities. Sensation intact. Gait not checked.  PSYCHIATRIC: The patient is alert and oriented x 3.  SKIN: No obvious rash, lesion, or ulcer.   Data Review     CBC w Diff:  Lab Results  Component Value Date   WBC 13.9* 04/16/2016   WBC 9.1 05/08/2013   HGB 8.9* 04/16/2016   HGB 12.9* 05/08/2013   HCT 27.5* 04/16/2016   HCT 38.2* 05/08/2013   PLT 217 04/16/2016   PLT 147* 05/08/2013   LYMPHOPCT 2% 04/10/2016   MONOPCT 7% 04/10/2016   EOSPCT 0% 04/10/2016   BASOPCT 1% 04/10/2016   CMP:  Lab Results  Component Value Date   NA 136 04/13/2016   NA 142 05/08/2013   K 3.6 04/13/2016   K 3.9 05/08/2013   CL 105 04/13/2016   CL 110* 05/08/2013   CO2 21* 04/13/2016   CO2 28 05/08/2013   BUN 39* 04/13/2016   BUN 23* 05/08/2013   CREATININE 1.21 04/16/2016   CREATININE 0.87 05/08/2013   PROT 5.3* 04/12/2016   PROT 6.6 05/08/2013   ALBUMIN 1.2* 04/10/2016   ALBUMIN 3.3* 05/08/2013   BILITOT 1.0 04/10/2016   BILITOT 0.4 05/08/2013   ALKPHOS 245* 04/10/2016   ALKPHOS 49* 05/08/2013   AST 29 04/10/2016   AST 20 05/08/2013   ALT 30 04/10/2016   ALT 22 05/08/2013  .  Micro Results Recent Results (from the past 240 hour(s))  Blood culture (routine x 2)     Status: Abnormal   Collection Time: 04/10/16  4:13 PM  Result Value Ref Range Status   Specimen Description BLOOD RIGHT FATTY CASTS  Final   Special Requests BOTTLES DRAWN AEROBIC AND ANAEROBIC 2CCAERO,1CCANA  Final   Culture  Setup Time   Final    GRAM NEGATIVE RODS AEROBIC BOTTLE ONLY CRITICAL RESULT CALLED TO, READ BACK BY AND VERIFIED WITH: CHRISTINA KATSOUDAS ON 04/11/16 AT 0901 BY QSD    Culture ESCHERICHIA COLI AEROBIC BOTTLE ONLY  (A)  Final   Report Status 04/15/2016 FINAL  Final   Organism ID, Bacteria ESCHERICHIA COLI  Final      Susceptibility   Escherichia coli - MIC*    AMPICILLIN >=32 RESISTANT Resistant      CEFAZOLIN >=64 RESISTANT Resistant     CEFEPIME <=1 SENSITIVE Sensitive     CEFTAZIDIME <=1 SENSITIVE Sensitive     CEFTRIAXONE <=1 SENSITIVE Sensitive     CIPROFLOXACIN >=4 RESISTANT Resistant     GENTAMICIN <=1 SENSITIVE Sensitive     IMIPENEM <=0.25 SENSITIVE Sensitive     TRIMETH/SULFA <=20 SENSITIVE Sensitive     AMPICILLIN/SULBACTAM >=32 RESISTANT Resistant     PIP/TAZO 64 INTERMEDIATE Intermediate     Extended ESBL NEGATIVE Sensitive     * ESCHERICHIA COLI  Blood Culture ID Panel (Reflexed)     Status: Abnormal   Collection Time: 04/10/16  4:13 PM  Result Value Ref Range Status   Enterococcus species NOT DETECTED NOT DETECTED Final   Vancomycin resistance NOT DETECTED NOT DETECTED Final   Listeria monocytogenes NOT DETECTED NOT DETECTED Final   Staphylococcus species NOT DETECTED NOT DETECTED Final   Staphylococcus aureus NOT DETECTED NOT DETECTED Final   Methicillin resistance NOT DETECTED NOT DETECTED Final   Streptococcus species NOT DETECTED NOT DETECTED Final   Streptococcus agalactiae NOT DETECTED NOT DETECTED Final   Streptococcus pneumoniae NOT DETECTED NOT DETECTED Final   Streptococcus pyogenes NOT DETECTED NOT DETECTED Final   Acinetobacter baumannii NOT DETECTED NOT DETECTED Final   Enterobacteriaceae species DETECTED (A) NOT DETECTED Final    Comment: CRITICAL RESULT CALLED TO, READ BACK BY AND VERIFIED WITH: CHRISTINA KATSOUDAS ON 04/11/16 AT 0901 BY QSD    Enterobacter cloacae complex NOT DETECTED NOT DETECTED Final   Escherichia coli DETECTED (A) NOT DETECTED Final    Comment: CRITICAL RESULT CALLED TO, READ BACK BY AND VERIFIED WITH: CHRISTINA KATSOUDAS ON 04/11/16 AT 0901 BY QSD    Klebsiella oxytoca NOT DETECTED NOT DETECTED Final   Klebsiella pneumoniae NOT DETECTED NOT DETECTED Final   Proteus species NOT DETECTED NOT DETECTED Final   Serratia marcescens NOT DETECTED NOT DETECTED Final   Carbapenem resistance NOT DETECTED NOT DETECTED Final    Haemophilus influenzae NOT DETECTED NOT DETECTED Final   Neisseria meningitidis NOT DETECTED NOT DETECTED Final   Pseudomonas aeruginosa NOT DETECTED NOT DETECTED Final   Candida albicans NOT DETECTED NOT DETECTED Final   Candida glabrata NOT DETECTED NOT DETECTED Final   Candida krusei NOT DETECTED NOT DETECTED Final   Candida parapsilosis NOT DETECTED NOT DETECTED Final   Candida tropicalis NOT DETECTED NOT DETECTED Final  Blood culture (routine x 2)     Status: None   Collection Time: 04/10/16  4:14 PM  Result Value Ref Range Status   Specimen Description BLOOD LEFT ASSIST CONTROL  Final   Special Requests BOTTLES DRAWN AEROBIC AND ANAEROBIC [place]  Final   Culture NO GROWTH 5 DAYS  Final   Report Status 04/15/2016 FINAL  Final  Urine culture     Status: None   Collection Time: 04/11/16  9:35 AM  Result Value Ref Range Status   Specimen Description URINE, RANDOM  Final   Special Requests NONE  Final   Culture NO GROWTH 2 DAYS  Final   Report Status 04/13/2016 FINAL  Final  CULTURE, BLOOD (ROUTINE X 2) w Reflex to PCR ID Panel     Status: None (Preliminary result)   Collection Time: 04/12/16 12:33 PM  Result Value  Ref Range Status   Specimen Description BLOOD LEFT WRIST  Final   Special Requests BOTTLES DRAWN AEROBIC AND ANAEROBIC  1CC  Final   Culture NO GROWTH 4 DAYS  Final   Report Status PENDING  Incomplete  Culture, body fluid-bottle     Status: None   Collection Time: 04/12/16  2:25 PM  Result Value Ref Range Status   Specimen Description ASCITIC ABDOMINAL FLUID  Final   Special Requests NONE  Final   Culture NO GROWTH 4 DAYS  Final   Report Status 04/16/2016 FINAL  Final  CULTURE, BLOOD (ROUTINE X 2) w Reflex to PCR ID Panel     Status: None (Preliminary result)   Collection Time: 04/12/16  3:26 PM  Result Value Ref Range Status   Specimen Description BLOOD LEFT HAND  Final   Special Requests BOTTLES DRAWN AEROBIC AND ANAEROBIC 2CCAERO,1CCANA  Final   Culture  NO GROWTH 4 DAYS  Final   Report Status PENDING  Incomplete        Code Status Orders        Start     Ordered   04/10/16 2002  Full code   Continuous     04/10/16 2001    Code Status History    Date Active Date Inactive Code Status Order ID Comments User Context   04/04/2016 11:59 AM 04/06/2016  5:01 PM Full Code [id number]  Lytle Butte, MD ED   02/09/2016 12:32 AM 02/11/2016  4:52 PM Full Code [id number]  Fritzi Mandes, MD Inpatient    Advance Directive Documentation        Most Recent Value   Type of Advance Directive  Healthcare Power of [place], Living will [POA: Rexene Alberts (spouse)]   Pre-existing out of facility DNR order (yellow form or pink MOST form)     "MOST" Form in Place?                Follow-up Information    Follow up with Lequita Asal, MD On 04/25/2016.   Specialty:  Hematology and Oncology   Why:  at 8:30 am. At the [facility] information:   [place] [address] [phone]       Discharge Medications     Medication List    STOP taking these medications        atorvastatin 80 MG tablet  Commonly known as:  LIPITOR     Rivaroxaban 15 MG Tabs tablet  Commonly known as:  XARELTO      TAKE these medications        aspirin 325 MG tablet  Take 1 tablet (325 mg total) by mouth daily.     docusate sodium 100 MG capsule  Commonly known as:  COLACE  Take 100 mg by mouth 2 (two) times daily as needed for mild constipation.     enoxaparin 150 MG/ML injection  Commonly known as:  LOVENOX  Inject 0.74 mLs (110 mg total) into the skin daily.     feeding supplement (ENSURE ENLIVE) Liqd  Take 237 mLs by mouth 2 (two) times daily between meals.     fentaNYL 12 MCG/HR  Commonly known as:  DURAGESIC - dosed mcg/hr  Place 1 patch (12.5 mcg total) onto the skin every 3 (three) days.     lidocaine-prilocaine cream  Commonly known as:  EMLA  Apply 1 application topically as needed (prior to  accessing port).     magnesium hydroxide  400 MG/5ML suspension  Commonly known as:  MILK OF MAGNESIA  Take 30 mLs by mouth daily as needed for mild constipation.     megestrol 400 MG/10ML suspension  Commonly known as:  MEGACE  Take 5 mLs (200 mg total) by mouth daily.     methimazole 10 MG tablet  Commonly known as:  TAPAZOLE  Take 5 mg by mouth daily.     mirtazapine 30 MG tablet  Commonly known as:  REMERON  Take 1 tablet (30 mg total) by mouth at bedtime.     ondansetron 4 MG disintegrating tablet  Commonly known as:  ZOFRAN-ODT  Take 1 tablet (4 mg total) by mouth every 6 (six) hours as needed for nausea. Reported on 02/14/2016     oxyCODONE 5 MG immediate release tablet  Commonly known as:  Oxy IR/ROXICODONE  Take 1 tablet (5 mg total) by mouth every 4 (four) hours as needed for moderate pain.     polyethylene glycol packet  Commonly known as:  MIRALAX / GLYCOLAX  Take 17 g by mouth daily.     senna 8.6 MG Tabs tablet  Commonly known as:  SENOKOT  Take 2 tablets (17.2 mg total) by mouth daily.     simethicone 80 MG chewable tablet  Commonly known as:  MYLICON  Chew 80 mg by mouth every 6 (six) hours as needed for flatulence.     zolpidem 5 MG tablet  Commonly known as:  AMBIEN  Take 1 tablet (5 mg total) by mouth at bedtime as needed for sleep.           Total Time in preparing paper work, data evaluation and todays exam - 35 minutes  Dustin Flock M.D on 04/16/2016 at 11:50 AM  [facility]   Office  [phone]

## 2016-04-16 NOTE — Progress Notes (Addendum)
Pt is alert with intermittent confusion, visual hallucinations, up to bsc with one assist, small bm following enema. [place] with paracentesis performed, daughter and wife at bedside. Pt denies pain throughout shift, new fentanyl patch applied to left hip. Pt is d/c to home with hospice, pt will be transported via EMS, pt is a full code at this time and prefers to remain a full code at this time. Pt dressed with assistant from nursing techs. Family at bedside at time of discharge. Uneventful shift.

## 2016-04-16 NOTE — Telephone Encounter (Signed)
Dr Mike Gip got a call from daughter and she called and spoke to daughter as well as spoke to daughter in hospital and he was set up for hospice at home when he discharges from [place].

## 2016-04-17 LAB — CULTURE, BLOOD (ROUTINE X 2)
CULTURE: NO GROWTH
CULTURE: NO GROWTH

## 2016-04-17 NOTE — Progress Notes (Signed)
[facility] Hematology/Oncology Progress Note  Date of admission: 04/10/2016  Hospital day:  04/16/2016  Chief Complaint: Cody Andrade is a 82 y.o. male with metastatic pancreatic cancer who was admitted with sepsis.  Subjective: Denies shortness of breath.  Remains weak and fatigued.  Feels less full after paracentesis today.  Not very hungry.  He was able to get into chair twice today.  Notes visual hallucinations (he realizes that they are not real).  Social History: The patient is alone today.  Allergies:  Allergies  Allergen Reactions  . Lodine [Etodolac] Nausea And Vomiting    Review of Systems: GENERAL: General fatigue. No fevers or sweats. PERFORMANCE STATUS (ECOG): 3-4 HEENT: No visual changes, sore throat, mouth sores or tenderness. Lungs:  Denies sortness of breath at rest.  No cough. No hemoptysis. Cardiac: No chest pain, palpitations, orthopnea, or PND. GI: Appetite poor. Denies nausea. Epigastric pain, improved.  No diarrhea, constipation, melena or hematochezia. GU: No urgency, frequency, dysuria, or hematuria. Musculoskeletal: Back pain. No joint pain. No muscle tenderness. Extremities: No pain or swelling. Skin: No rashes or skin changes. Neuro: General weakness.No headache, numbness or weakness, balance or coordination issues. Endocrine: No diabetes, thyroid issues, hot flashes or night sweats. Psych: Visual hallucinations (unknown people, ladder, dragon). Pain: Upper abdominal pain, improved. Review of systems: All other systems reviewed and found to be negative  Physical Exam: Blood pressure 105/60, pulse 97, temperature 98.3 F (36.8 C), temperature source Oral, resp. rate 16, height 5' 11"  (1.803 m), weight 166 lb 11.2 oz (75.615 kg), SpO2 95 %.  GENERAL: Thin, chronically ill appearing gentleman lying comfortably on the medical unit in no acute distress. MENTAL STATUS: Alert and oriented to person, place and  time. HEAD: Pearline Cables hair. Normocephalic, atraumatic, face symmetric, no Cushingoid features. EYES: Glasses. Hazel eyes. Pupils equal round and reactive to light and accomodation. No conjunctivitis or scleral icterus. RESPIRATORY: Poor respiratory excursion.  Decreased breath sounds at the bases.  Clear to auscultation without rales, wheezes or rhonchi. CARDIOVASCULAR: Regular rate and rhythm without murmur, rub or gallop. ABDOMEN: Soft, slightly tender in the epigastric region without guarding or rebound tenderness. No hepatosplenomegaly. No masses. SKIN: No rashes, ulcers or lesions. EXTREMITIES: Left lower extremity 2+ edema. Right 1+ ankle edema.  No skin discoloration or tenderness. LYMPH NODES: No palpable cervical, supraclavicular, axillary or inguinal adenopathy  NEUROLOGICAL: Unremarkable. PSYCH: Appropriate  Results for orders placed or performed during the hospital encounter of 04/10/16 (from the past 48 hour(s))  Creatinine, serum     Status: Abnormal   Collection Time: 04/16/16  4:26 AM  Result Value Ref Range   Creatinine, Ser 1.21 0.61 - 1.24 mg/dL   GFR calc non Af Amer 55 (L) >60 mL/min   GFR calc Af Amer >60 >60 mL/min    Comment: (NOTE) The eGFR has been calculated using the CKD EPI equation. This calculation has not been validated in all clinical situations. eGFR's persistently <60 mL/min signify possible Chronic Kidney Disease.   CBC     Status: Abnormal   Collection Time: 04/16/16  4:26 AM  Result Value Ref Range   WBC 13.9 (H) 3.8 - 10.6 K/uL   RBC 3.01 (L) 4.40 - 5.90 MIL/uL   Hemoglobin 8.9 (L) 13.0 - 18.0 g/dL   HCT 27.5 (L) 40.0 - 52.0 %   MCV 91.5 80.0 - 100.0 fL   MCH 29.6 26.0 - 34.0 pg   MCHC 32.4 32.0 - 36.0 g/dL   RDW 17.8 (  H) 11.5 - 14.5 %   Platelets 217 150 - 440 K/uL   US Paracentesis  04/16/2016  INDICATION: Ascites. EXAM: ULTRASOUND GUIDED therapeutic PARACENTESIS MEDICATIONS: None. COMPLICATIONS: None immediate. PROCEDURE:  Informed written consent was obtained from the patient after a discussion of the risks, benefits and alternatives to treatment. A timeout was performed prior to the initiation of the procedure. Initial ultrasound scanning demonstrates a large amount of ascites within the right lower abdominal quadrant. The right lower abdomen was prepped and draped in the usual sterile fashion. 1% lidocaine with epinephrine was used for local anesthesia. Following this, a Safe-T-Centesis catheter was introduced. An ultrasound image was saved for documentation purposes. The paracentesis was performed. The catheter was removed and a dressing was applied. The patient tolerated the procedure well without immediate post procedural complication. FINDINGS: A total of approximately 3.9 L of serous fluid was removed. IMPRESSION: Successful ultrasound-guided paracentesis yielding 3.9 liters of peritoneal fluid. Electronically Signed   By: Marijo Conception, M.D.   On: 04/16/2016 12:22    Assessment:  Cody Andrade is a 82 y.o. male with metastatic pancreatic cancer s/p 1 cycle of gemcitabine (began 03/07/2016). 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 He presented with abdominal pain, coffee ground emesis, and E coli sepsis.  Abdominal and pelvic CT scan on 04/10/2016 revealed new ascites, progressive enlargement of the pancreatic cancer and metastatic disease in the liver. There was new splenic lacerations without a subcapsular hematoma or visible acute hemorrhage. There was questionable new venous thrombosis of the left external iliac vein and left common femoral vein.  He underwent paracentesis on 04/12/2016.  He underwent paracentesis x 2 (3 liters on 04/12/2016 and 3.9 liters on 04/16/2016).  Symptomatically, he remains fatigued.  Appetite and oral intake is minimal.  He has visual hallucinations.  Plan: 1.   Oncology:  Metastatic pancreatic cancer.  Abdominal pain improved on Fentanyl patch and morphine prn.  Patient is s/p 2 paracentesis in 4 days.  Spoke at length with patient and later with his younger daughter and Elizandro Laura from Hospice.  Patient's life expectancy is short (few weeks).  Discuss patient's plan to go home.  He will need 24 hour support.  Discussed potential Hospice Home.  Readdressed code status.  Patient remains FULL CODE.  He does not want to readdress.  He speaks of re-evaluation in 2 weeks after he has gained his strength back.  Support provided patient and his daughter.  2.  Hematology:  Patient on Lovenox for a left lower extremity DVT.  3.  Pulmonary:  Patient's air hunger related to lymphangitic spread of tumor. Patient on oxygen and morphine prn.    4.  Infectious disease:  E coli sepsis.  Ceftriaxone switched to oral antibiotics.  5.  Disposition:  Discharge home with Hospice services.  Discussed my availability to patient and his family at discharge.   Lequita Asal, MD  04/16/2016

## 2016-04-18 ENCOUNTER — Telehealth: Payer: Self-pay | Admitting: *Deleted

## 2016-04-18 NOTE — Telephone Encounter (Signed)
Er Dr Mike Gip, Lovenox will be indefinite, Abx were to be ordered by Dr Posey Pronto, I spoke with him and he asked for pt phone number and said he will take care of it

## 2016-04-18 NOTE — Telephone Encounter (Signed)
Note from D rCorcoran day of discharge states he should be on oral abx, but nothing was ordered. Also needs to know how long he will be on Lovenox.

## 2016-04-22 ENCOUNTER — Telehealth: Payer: Self-pay | Admitting: *Deleted

## 2016-04-22 NOTE — Telephone Encounter (Signed)
Called the house and Elmyra Ricks his daughter answered and states if he is in bed he is ok. If he sits up and gets out of bed and makes 2 steps he is short of breath.  Dr. Mike Gip is agreeable to paracentesis.

## 2016-04-22 NOTE — Telephone Encounter (Signed)
Family requesting paracentesis to drain his abd again. E is nauseated, has increased SOB, and his abd is tight. Please arrange and notifiy pt / family

## 2016-04-23 ENCOUNTER — Telehealth: Payer: Self-pay | Admitting: *Deleted

## 2016-04-23 ENCOUNTER — Other Ambulatory Visit: Payer: Self-pay | Admitting: *Deleted

## 2016-04-23 DIAGNOSIS — C787 Secondary malignant neoplasm of liver and intrahepatic bile duct: Secondary | ICD-10-CM

## 2016-04-23 DIAGNOSIS — C7800 Secondary malignant neoplasm of unspecified lung: Secondary | ICD-10-CM

## 2016-04-23 DIAGNOSIS — C250 Malignant neoplasm of head of pancreas: Secondary | ICD-10-CM

## 2016-04-23 DIAGNOSIS — C259 Malignant neoplasm of pancreas, unspecified: Secondary | ICD-10-CM

## 2016-04-23 NOTE — Telephone Encounter (Signed)
Called pt's house and got family member on the phone and they will give Cody Andrade the message that pt can have paracentesis Thursday 5/18. [facility] for a 2:30 proc.  Will need to hold the lovenox dose tom.  And pt can take the lovenox on Thursday after the procedure if he is not having bleeding issues

## 2016-04-24 NOTE — Discharge Instructions (Signed)
Paracentesis, Care After °Refer to this sheet in the next few weeks. These instructions provide you with information about caring for yourself after your procedure. Your health care provider may also give you more specific instructions. Your treatment has been planned according to current medical practices, but problems sometimes occur. Call your health care provider if you have any problems or questions after your procedure. °WHAT TO EXPECT AFTER THE PROCEDURE °After your procedure, it is common to have a small amount of clear fluid coming from the puncture site. °HOME CARE INSTRUCTIONS °· Return to your normal activities as told by your health care provider. Ask your health care provider what activities are safe for you. °· Take over-the-counter and prescription medicines only as told by your health care provider. °· Do not take baths, swim, or use a hot tub until your health care provider approves. °· Follow instructions from your health care provider about: °¨ How to take care of your puncture site. °¨ When and how you should change your bandage (dressing). °¨ When you should remove your dressing. °· Check your puncture area every day signs of infection. Watch for: °¨ Redness, swelling, or pain. °¨ Fluid, blood, or pus. °· Keep all follow-up visits as told by your health care provider. This is important. °SEEK MEDICAL CARE IF: °· You have redness, swelling, or pain at your puncture site. °· You start to have more clear fluid coming from your puncture site. °· You have blood or pus coming from your puncture site. °· You have chills. °· You have a fever. °SEEK IMMEDIATE MEDICAL CARE IF: °· You develop chest pain or shortness of breath. °· You develop increasing pain, discomfort, or swelling in your abdomen. °· You feel dizzy or light-headed or you pass out. °  °This information is not intended to replace advice given to you by your health care provider. Make sure you discuss any questions you have with your health  care provider. °  °Document Released: 04/11/2015 Document Reviewed: 04/11/2015 °Elsevier Interactive Patient Education ©2016 [employer] ° °

## 2016-04-25 ENCOUNTER — Telehealth: Payer: Self-pay | Admitting: *Deleted

## 2016-04-25 ENCOUNTER — Ambulatory Visit
Admission: RE | Admit: 2016-04-25 | Discharge: 2016-04-25 | Disposition: A | Discharge status: Home / Self Care | Source: Ambulatory Visit | Attending: Hematology and Oncology | Admitting: Hematology and Oncology

## 2016-04-25 ENCOUNTER — Inpatient Hospital Stay: Payer: Medicare Other | Admitting: Hematology and Oncology

## 2016-04-25 ENCOUNTER — Inpatient Hospital Stay (HOSPITAL_COMMUNITY)
Admission: RE | Admit: 2016-04-25 | Discharge: 2016-04-25 | Disposition: A | Discharge status: Home / Self Care | Payer: Medicare Other | Source: Ambulatory Visit | Attending: Hematology and Oncology | Admitting: Hematology and Oncology

## 2016-04-25 DIAGNOSIS — R188 Other ascites: Secondary | ICD-10-CM | POA: Insufficient documentation

## 2016-04-25 DIAGNOSIS — C250 Malignant neoplasm of head of pancreas: Secondary | ICD-10-CM

## 2016-04-25 DIAGNOSIS — C259 Malignant neoplasm of pancreas, unspecified: Secondary | ICD-10-CM | POA: Diagnosis present

## 2016-04-25 DIAGNOSIS — C7800 Secondary malignant neoplasm of unspecified lung: Secondary | ICD-10-CM | POA: Diagnosis present

## 2016-04-25 DIAGNOSIS — C787 Secondary malignant neoplasm of liver and intrahepatic bile duct: Secondary | ICD-10-CM | POA: Diagnosis not present

## 2016-04-25 MED ORDER — ALBUMIN HUMAN 25 % IV SOLN
25.0000 g | Freq: Once | INTRAVENOUS | Status: AC
  Administered 2016-04-25: 25 g via INTRAVENOUS
  Filled 2016-04-25: qty 100

## 2016-04-25 NOTE — Discharge Instructions (Signed)
Paracentesis, Care After °Refer to this sheet in the next few weeks. These instructions provide you with information about caring for yourself after your procedure. Your health care provider may also give you more specific instructions. Your treatment has been planned according to current medical practices, but problems sometimes occur. Call your health care provider if you have any problems or questions after your procedure. °WHAT TO EXPECT AFTER THE PROCEDURE °After your procedure, it is common to have a small amount of clear fluid coming from the puncture site. °HOME CARE INSTRUCTIONS °· Return to your normal activities as told by your health care provider. Ask your health care provider what activities are safe for you. °· Take over-the-counter and prescription medicines only as told by your health care provider. °· Do not take baths, swim, or use a hot tub until your health care provider approves. °· Follow instructions from your health care provider about: °¨ How to take care of your puncture site. °¨ When and how you should change your bandage (dressing). °¨ When you should remove your dressing. °· Check your puncture area every day signs of infection. Watch for: °¨ Redness, swelling, or pain. °¨ Fluid, blood, or pus. °· Keep all follow-up visits as told by your health care provider. This is important. °SEEK MEDICAL CARE IF: °· You have redness, swelling, or pain at your puncture site. °· You start to have more clear fluid coming from your puncture site. °· You have blood or pus coming from your puncture site. °· You have chills. °· You have a fever. °SEEK IMMEDIATE MEDICAL CARE IF: °· You develop chest pain or shortness of breath. °· You develop increasing pain, discomfort, or swelling in your abdomen. °· You feel dizzy or light-headed or you pass out. °  °This information is not intended to replace advice given to you by your health care provider. Make sure you discuss any questions you have with your health  care provider. °  °Document Released: 04/11/2015 Document Reviewed: 04/11/2015 °Elsevier Interactive Patient Education ©2016 [employer] ° °

## 2016-04-25 NOTE — Telephone Encounter (Signed)
Called to state he will not be able to make his appt this morning and that he is going to have EMS transport him for his paracentesis this afternoon. Asking if he can have his labs drawn then and if the Albumin has been ordered with the paracentesis, also asking if he can be seen by MD after paracentesis

## 2016-04-25 NOTE — Telephone Encounter (Signed)
Cancel appt no need to see pt or have labs as that was to see about getting chemo and he is not going to get chemo. Albumin has been ordered I spoke with Cody Andrade and advised her of this and explained why no labs and she asked what if they have greatly improved? I stated the fact that he is needing to have a paracentesis it is highly unlikely he has improved and that our goal right now is for comfort care. She became tearful and thanked me for calling

## 2016-04-25 NOTE — OR Nursing (Signed)
Ems called for transport home.

## 2016-05-01 ENCOUNTER — Other Ambulatory Visit: Payer: Self-pay | Admitting: Hematology and Oncology

## 2016-05-01 ENCOUNTER — Other Ambulatory Visit: Payer: Self-pay | Admitting: *Deleted

## 2016-05-01 ENCOUNTER — Telehealth: Payer: Self-pay | Admitting: *Deleted

## 2016-05-01 ENCOUNTER — Telehealth: Payer: Self-pay | Admitting: Hematology and Oncology

## 2016-05-01 DIAGNOSIS — C259 Malignant neoplasm of pancreas, unspecified: Secondary | ICD-10-CM

## 2016-05-01 DIAGNOSIS — R188 Other ascites: Secondary | ICD-10-CM

## 2016-05-01 MED ORDER — MORPHINE SULFATE (CONCENTRATE) 20 MG/ML PO SOLN: 5.0000 mg | ORAL | Status: AC | PRN

## 2016-05-01 MED ORDER — OXYCODONE HCL ER 10 MG PO T12A: 10.0000 mg | EXTENDED_RELEASE_TABLET | Freq: Two times a day (BID) | ORAL | Status: DC

## 2016-05-01 NOTE — Telephone Encounter (Signed)
Spoke to vicki at hospice and he is taking in liq. Without choking, taking oxycodone with a problem.  Sob is not as bad as it was before last paracentesis but is bad. She inc. Oxygen 3 1/2 liters. Gave him lasix 20 mg one time today. Want s permission if it works to give it to him PRN as needed for SOB to help with fluid overload.  Would like oral long term med and breakthrough roxanol. Mike Gip is agreeable and will write rx for oxycontin and roxanol and I faxed it to pharmacy and told Vicky about this and she will get intouch with shannon and let her know the plan

## 2016-05-01 NOTE — Telephone Encounter (Signed)
Called to report that she does not think he is absorbing the fentanyl due to weight loss and no Sub Q fat, asking about starting him on a long acting tablet. He is also asking to be set up for another paracentesis. Hiccoughs and thrush has improved. Please advise

## 2016-05-01 NOTE — Telephone Encounter (Signed)
  OK to set up paracentesis. Can it be done this week (before the holiday weekend)?  M

## 2016-05-01 NOTE — Telephone Encounter (Signed)
She wants to talk to Dr. Mike Gip about turmeric. She read a study about it and has questions about it's possible benefits or harm. Also, her father was asking her mom about resuming chemo and she thought you should know that as well. Thanks.

## 2016-05-01 NOTE — Telephone Encounter (Signed)
I have given numbers to Dr. Mike Gip and she will contact family today

## 2016-05-01 NOTE — Telephone Encounter (Signed)
Not sure if we can get one before weekend but I have faxed request  in and I will call specials tom. To see if it can be done. Order in computer for procedure

## 2016-05-02 ENCOUNTER — Telehealth: Payer: Self-pay | Admitting: Hematology and Oncology

## 2016-05-02 MED ORDER — MORPHINE SULFATE ER 15 MG PO TBCR: 15.0000 mg | EXTENDED_RELEASE_TABLET | Freq: Two times a day (BID) | ORAL | Status: AC

## 2016-05-02 NOTE — Telephone Encounter (Signed)
Oxycontin is not covered under hospice need MS Contin. Per Dr Mike Gip, MS Contin 15 mg q 12 h

## 2016-05-02 NOTE — Telephone Encounter (Signed)
Re:  Tumeric  Called twice to discuss tumeric with Elmyra Ricks [phone]). Phone rang several times then message voicemail is full.  Lequita Asal, MD

## 2016-05-02 NOTE — Discharge Instructions (Signed)
Paracentesis, Care After °Refer to this sheet in the next few weeks. These instructions provide you with information about caring for yourself after your procedure. Your health care provider may also give you more specific instructions. Your treatment has been planned according to current medical practices, but problems sometimes occur. Call your health care provider if you have any problems or questions after your procedure. °WHAT TO EXPECT AFTER THE PROCEDURE °After your procedure, it is common to have a small amount of clear fluid coming from the puncture site. °HOME CARE INSTRUCTIONS °· Return to your normal activities as told by your health care provider. Ask your health care provider what activities are safe for you. °· Take over-the-counter and prescription medicines only as told by your health care provider. °· Do not take baths, swim, or use a hot tub until your health care provider approves. °· Follow instructions from your health care provider about: °¨ How to take care of your puncture site. °¨ When and how you should change your bandage (dressing). °¨ When you should remove your dressing. °· Check your puncture area every day signs of infection. Watch for: °¨ Redness, swelling, or pain. °¨ Fluid, blood, or pus. °· Keep all follow-up visits as told by your health care provider. This is important. °SEEK MEDICAL CARE IF: °· You have redness, swelling, or pain at your puncture site. °· You start to have more clear fluid coming from your puncture site. °· You have blood or pus coming from your puncture site. °· You have chills. °· You have a fever. °SEEK IMMEDIATE MEDICAL CARE IF: °· You develop chest pain or shortness of breath. °· You develop increasing pain, discomfort, or swelling in your abdomen. °· You feel dizzy or light-headed or you pass out. °  °This information is not intended to replace advice given to you by your health care provider. Make sure you discuss any questions you have with your health  care provider. °  °Document Released: 04/11/2015 Document Reviewed: 04/11/2015 °Elsevier Interactive Patient Education ©2016 [employer] ° °

## 2016-05-03 ENCOUNTER — Ambulatory Visit
Admission: RE | Admit: 2016-05-03 | Discharge: 2016-05-03 | Disposition: A | Discharge status: Home / Self Care | Source: Ambulatory Visit | Attending: Hematology and Oncology | Admitting: Hematology and Oncology

## 2016-05-03 DIAGNOSIS — R188 Other ascites: Secondary | ICD-10-CM | POA: Insufficient documentation

## 2016-05-03 DIAGNOSIS — C259 Malignant neoplasm of pancreas, unspecified: Secondary | ICD-10-CM | POA: Diagnosis present

## 2016-05-03 MED ORDER — ALBUMIN HUMAN 25 % IV SOLN
25.0000 g | Freq: Once | INTRAVENOUS | Status: AC
  Administered 2016-05-03: 25 g via INTRAVENOUS
  Filled 2016-05-03: qty 100

## 2016-05-03 NOTE — Procedures (Signed)
Successful US guided paracentesis.  Removed 3.8 liters of yellow fluid.  No immediate complication.

## 2016-05-08 ENCOUNTER — Telehealth: Payer: Self-pay | Admitting: *Deleted

## 2016-05-09 NOTE — Telephone Encounter (Signed)
Called to report that Mr Cody Andrade and was pronounced dead at 1:15 PM today 2016/05/16

## 2016-05-09 DEATH — deceased

## 2016-06-11 ENCOUNTER — Other Ambulatory Visit: Payer: Self-pay | Admitting: Nurse Practitioner

## 2017-01-01 IMAGING — CT CT ABD-PELV W/ CM
2 of 6 series · 15 of 46 positions shown, 17 images · IV contrast (iopamidol)
Comparison: Abdominal CT dated 02/14/2016 and chest CT dated
03/28/2016 and PET-CT scan dated 02/15/2016

CLINICAL DATA: Periumbilical pain which developed early this
morning with nausea and vomiting. Metastatic pancreatic cancer.

EXAM:
CT ABDOMEN AND PELVIS WITH CONTRAST
TECHNIQUE: Multidetector CT imaging of the abdomen and pelvis was performed
using the standard protocol following bolus administration of
intravenous contrast.
CONTRAST:  100mL [id number] IOPAMIDOL ([id number]) INJECTION 61%

[Series 2: routine abd pel with · axial · 0.72mm/px · z∈[-532,-152]mm · 12 of 88 slices shown, 14 images]
[im 6/88  soft-tissue]
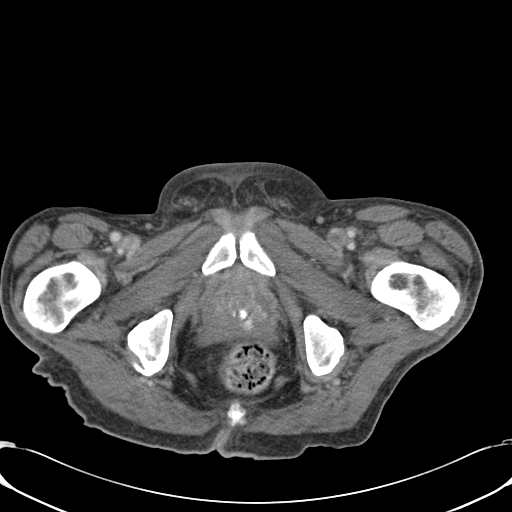
[im 6/88  bone]
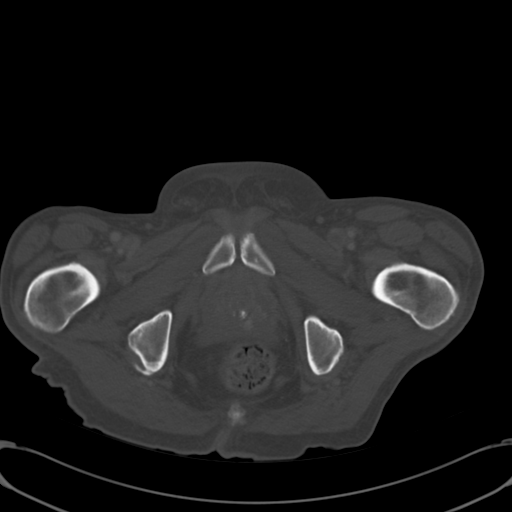
[im 11/88  soft-tissue]
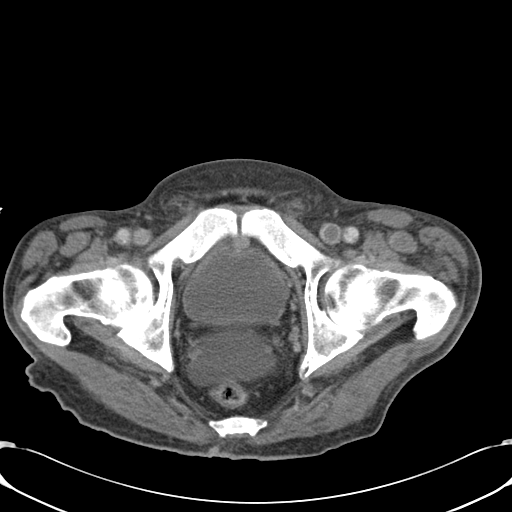
[im 22/88  soft-tissue]
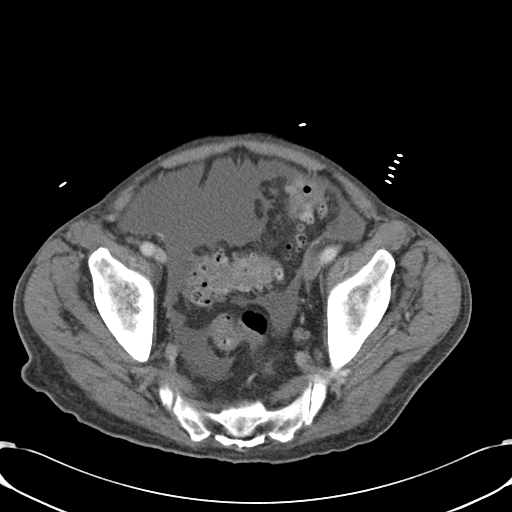
[im 28/88  soft-tissue]
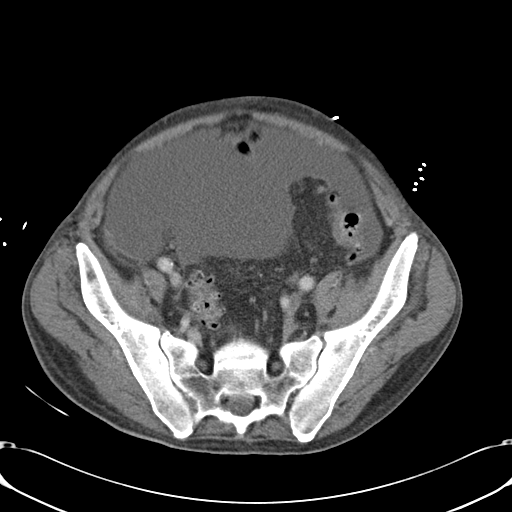
[im 33/88  soft-tissue]
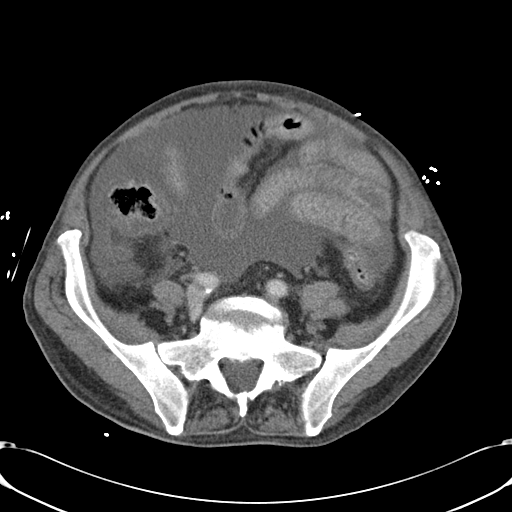
[im 39/88  soft-tissue]
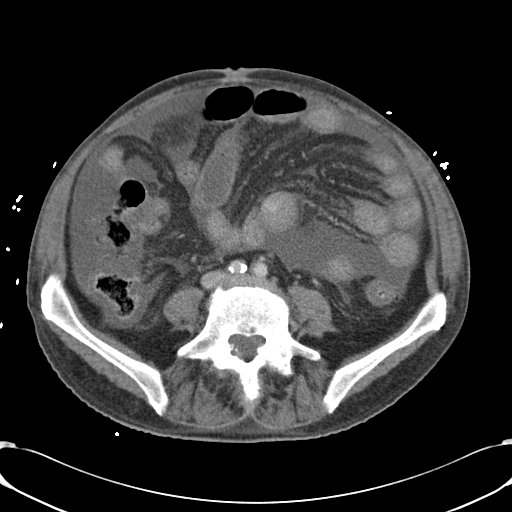
[im 49/88  soft-tissue]
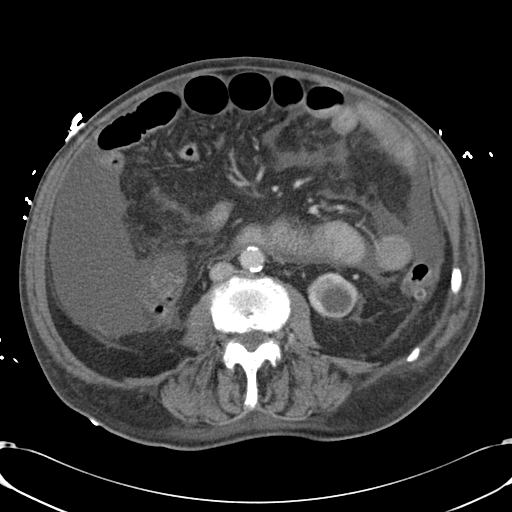
[im 55/88  soft-tissue]
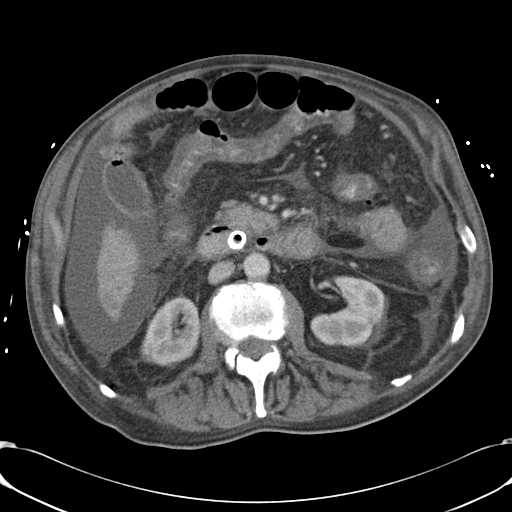
[im 60/88  soft-tissue]
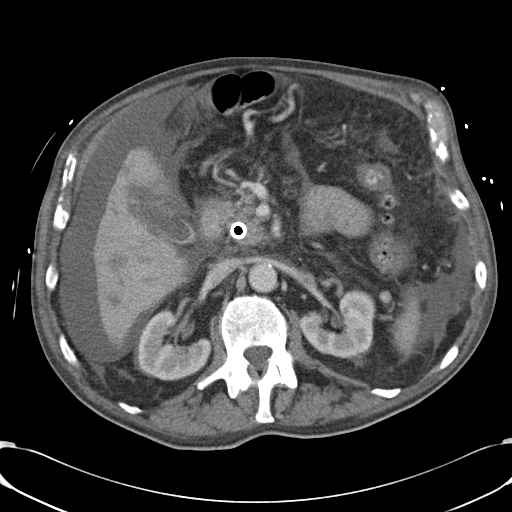
[im 60/88  bone]
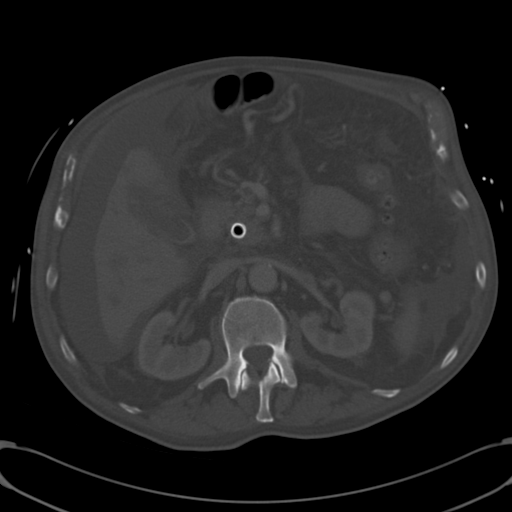
[im 66/88  soft-tissue]
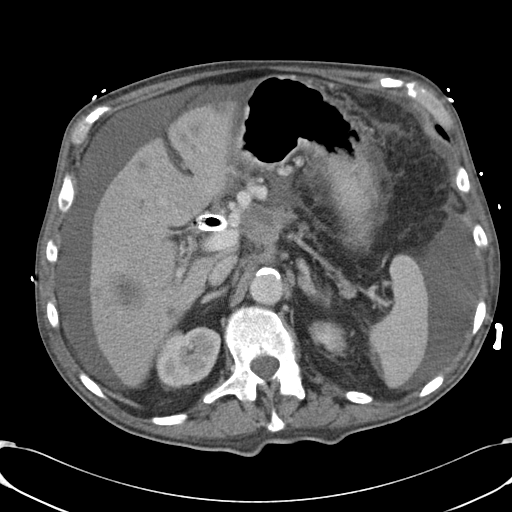
[im 77/88  soft-tissue]
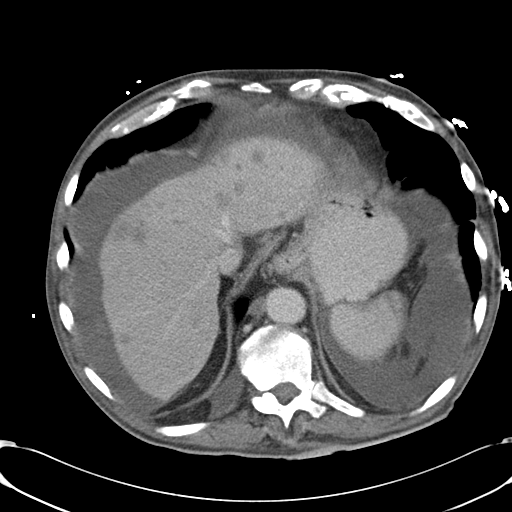
[im 82/88  soft-tissue]
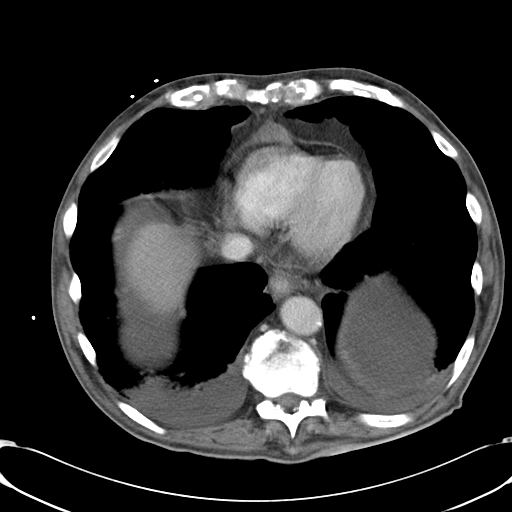

[Series 5: cor routine abd pel with · coronal · 0.68mm/px · 3 of 144 slices shown]
[im 48/144  soft-tissue]
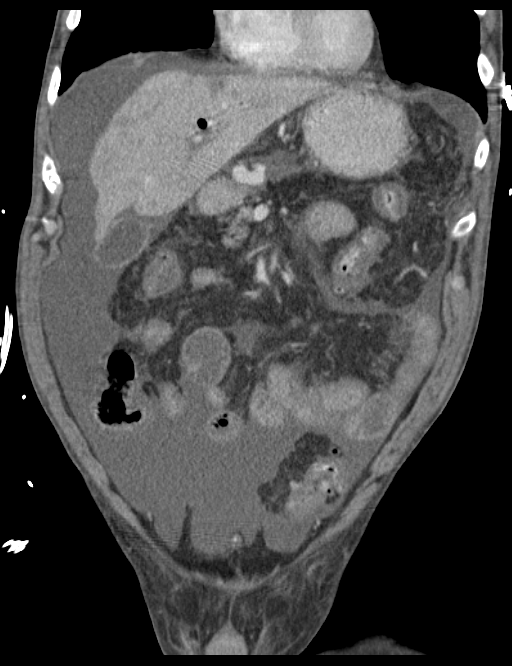
[im 64/144  soft-tissue]
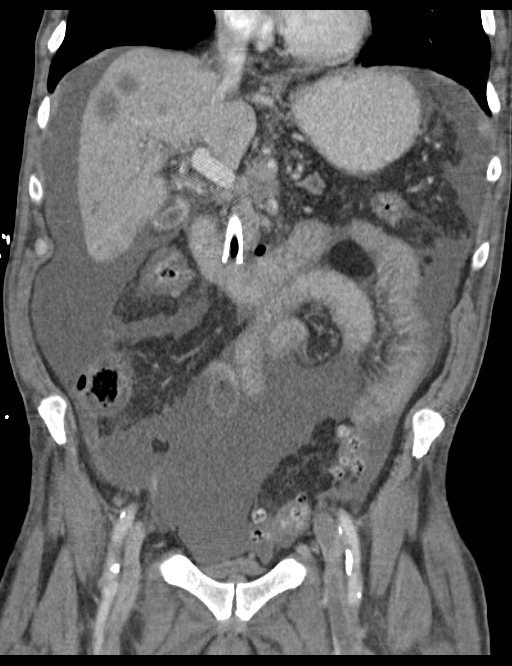
[im 80/144  soft-tissue]
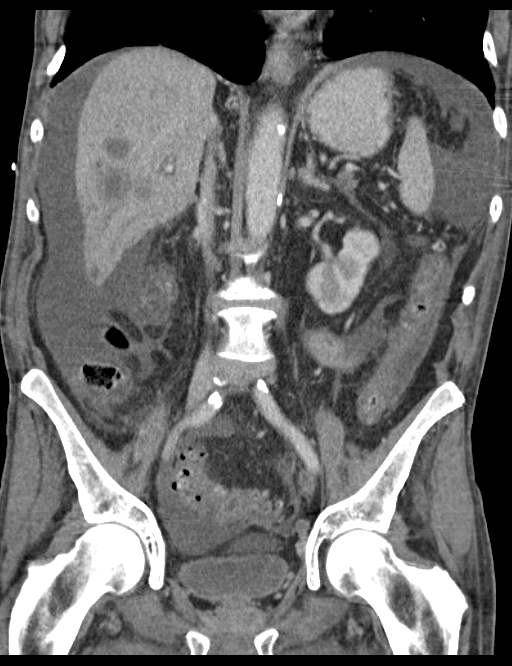

[15 of 46 positions shown; findings below may reference images not displayed]

FINDINGS: Lower chest: New small bilateral pleural effusions. Stable 5 mm
nodule in the right middle lobe peripherally. Subtle new peripheral
edema at the lung bases. The lungs are hyperinflated consistent
emphysema.

Hepatobiliary: Numerous metastatic lesions in the liver have
progressed. There is new periportal edema. Biliary stent in place
with air in the biliary tree.

Pancreas: Pancreatic mass measures 3.3 x 2.5 cm, increased from
x 2.3 cm. Increased atrophy of the pancreatic tail with increased
dilatation of the distal pancreatic duct.

Spleen: There are 2 new linear areas of abnormal low density in the
periphery of the superior aspect of the spleen consistent with
splenic lacerations. The configuration is not suggestive of
metastatic disease. There is no subcapsular hemorrhage.

Adrenals/Urinary Tract: The adrenal glands and right kidney are
normal. Benign 14 mm cyst in the upper pole of the left kidney and
benign 23 mm cyst in the lower pole of the left kidney. No
hydronephrosis. Bladder appears normal.

Stomach/Bowel: Extensive diverticulosis of the left side of the
colon. Diffuse edema of the colonic mucosa. Small bowel appears
normal. Peripheral lacerations in the upper pole of the spleen
without subcapsular hematoma.

Vascular/Lymphatic: The left external iliac vein is enlarged as is
the left common femoral vein and there appear to be thrombi in these
vessels. This is not not definitive since mixing of unenhanced blood
with enhance blood could give this appearance although the
distention of the vein would be atypical and is new since the prior
study. Aortic atherosclerosis.

Reproductive: Normal.

Other: Extensive new ascites since the CT scan of 02/14/2016.
Increased ascites since the CT scan of 03/28/2016.

Musculoskeletal: Multilevel degenerative facet arthritis in the
lumbar spine. No acute abnormality. No visible bone metastases.
IMPRESSION: 1. New small bilateral pleural effusions and subtle pulmonary edema.
2. Progressive metastatic disease in the liver.
3. Progressive enlargement of the pancreatic cancer.
4. New splenic lacerations without a subcapsular hematoma or visible
acute hemorrhage.
5. New probable venous thrombosis of the left external iliac vein
and left common femoral vein.
6. Aortic atherosclerosis.
7. New extensive ascites since 02/14/2016, progressed since
03/28/2016

## 2017-01-07 IMAGING — US US PARACENTESIS
1 series · 6 of 6 positions shown · non-contrast
Comparison: none

INDICATION: Ascites.

[Series 1: us paracentesis · 0.26mm/px · 6 of 6 slices shown]
[im 1/6]
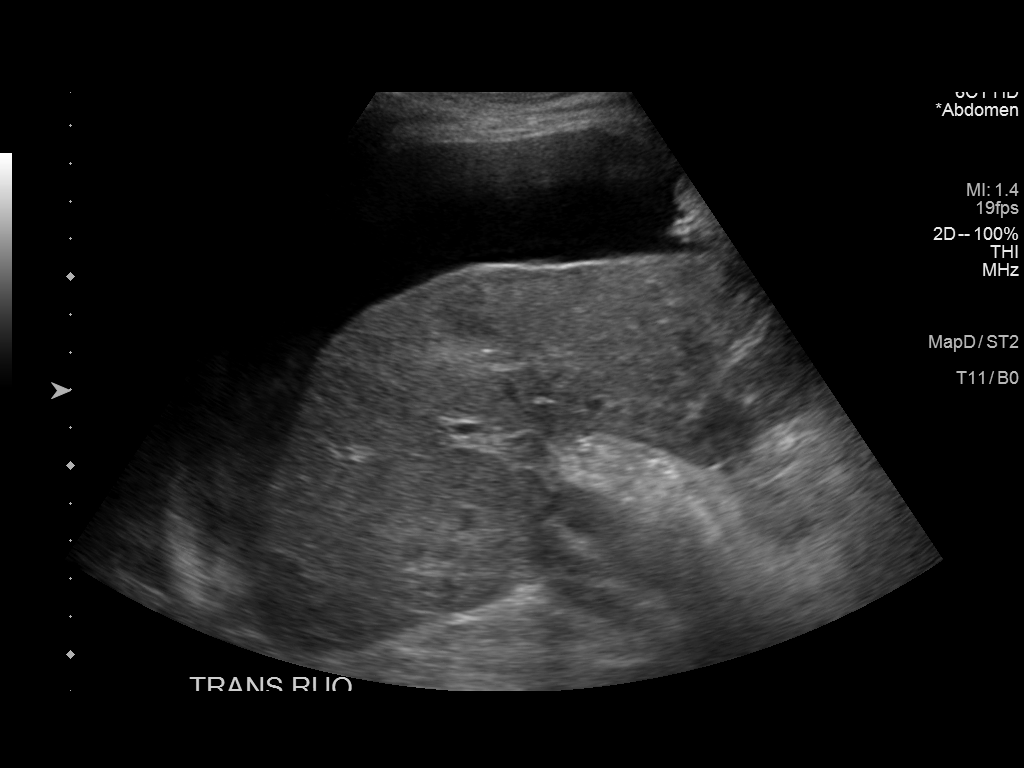
[im 2/6]
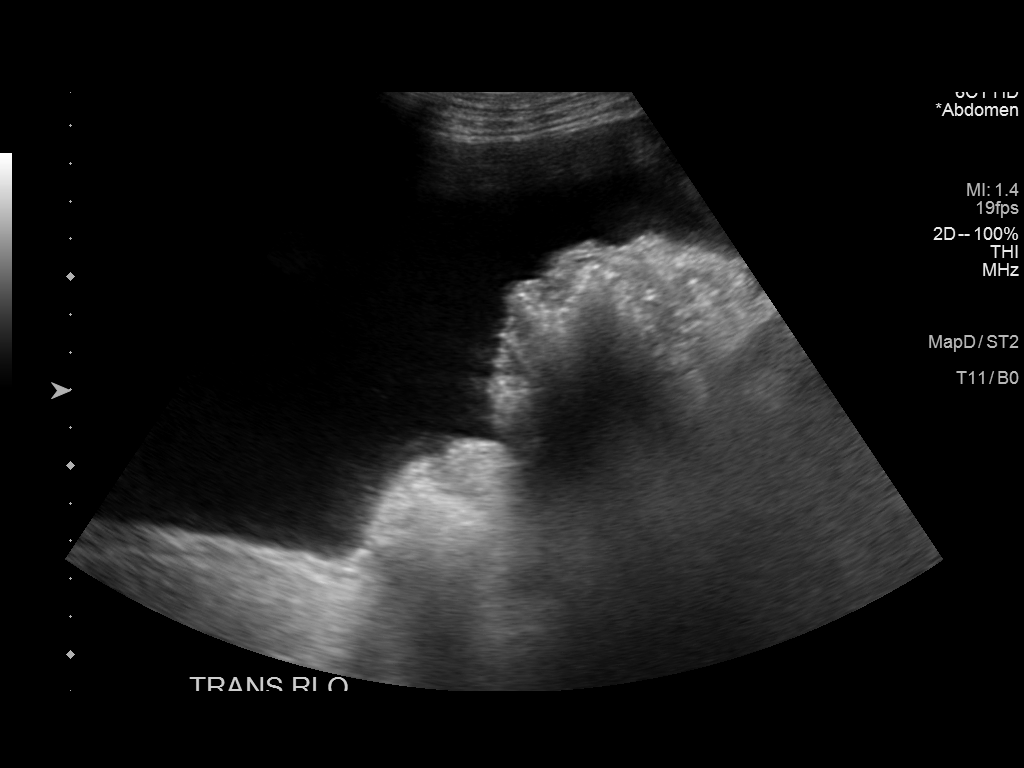
[im 3/6]
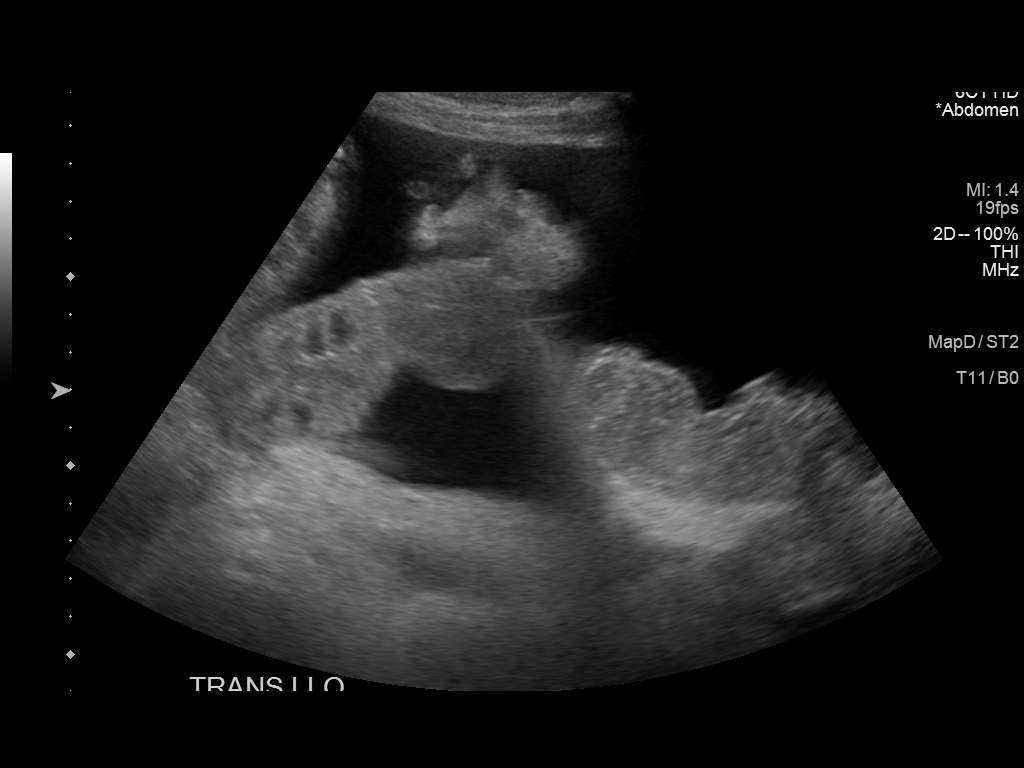
[im 4/6]
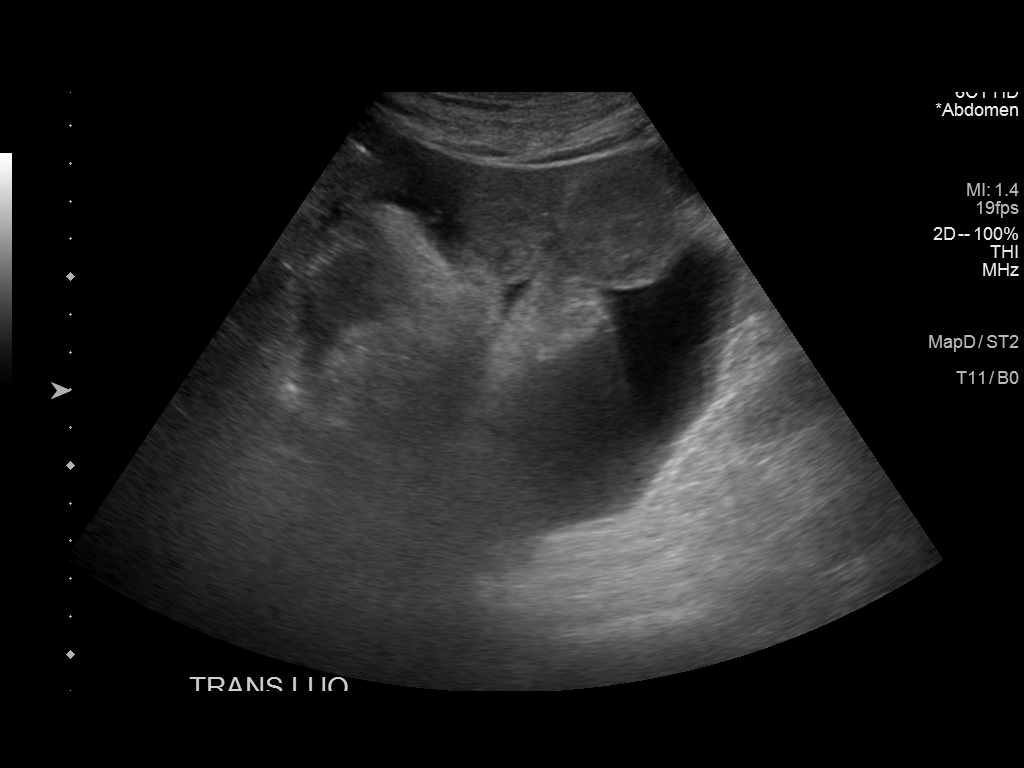
[im 5/6]
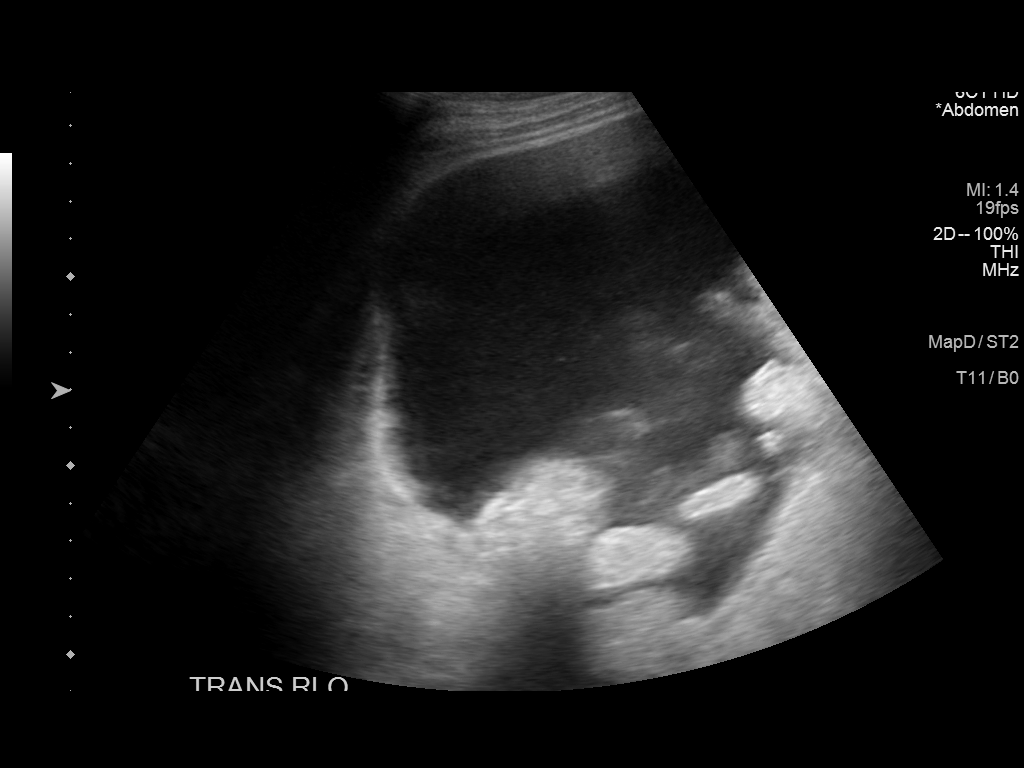
[im 6/6]
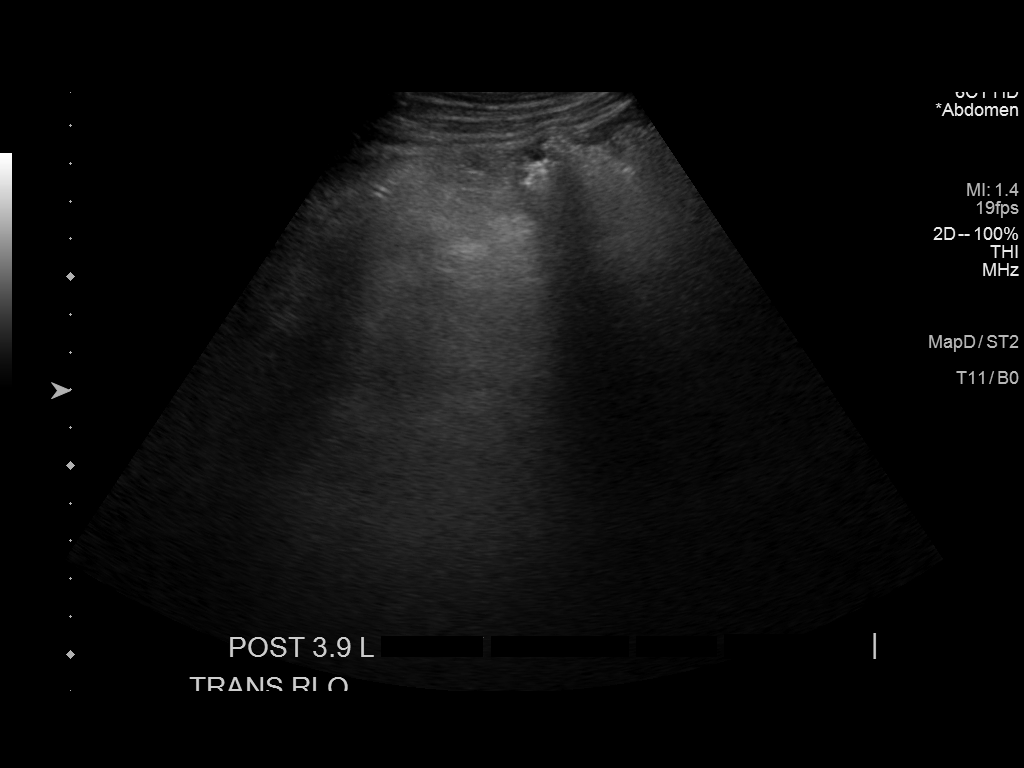

[6 of 6 positions shown; findings below may reference images not displayed]

EXAM:
ULTRASOUND GUIDED therapeutic PARACENTESIS

MEDICATIONS:
None.

COMPLICATIONS:
None immediate.

PROCEDURE:
Informed written consent was obtained from the patient after a
discussion of the risks, benefits and alternatives to treatment. A
timeout was performed prior to the initiation of the procedure.

Initial ultrasound scanning demonstrates a large amount of ascites
within the right lower abdominal quadrant. The right lower abdomen
was prepped and draped in the usual sterile fashion. 1% lidocaine
with epinephrine was used for local anesthesia.

Following this, a Safe-T-Centesis catheter was introduced. An
ultrasound image was saved for documentation purposes. The
paracentesis was performed. The catheter was removed and a dressing
was applied. The patient tolerated the procedure well without
immediate post procedural complication.
FINDINGS: A total of approximately 3.9 L of serous fluid was removed.
IMPRESSION: Successful ultrasound-guided paracentesis yielding 3.9 liters of
peritoneal fluid.

## 2017-04-05 IMAGING — US US BIOPSY
1 series · 12 of 12 positions shown · non-contrast
Comparison: none

INDICATION: 82-year-old male with pancreatic lesion and evidence for metastatic
disease. Patient needs a tissue diagnosis.

[Series 1: us biopsy · 0.05mm/px · 12 of 12 slices shown]
[im 1/12]
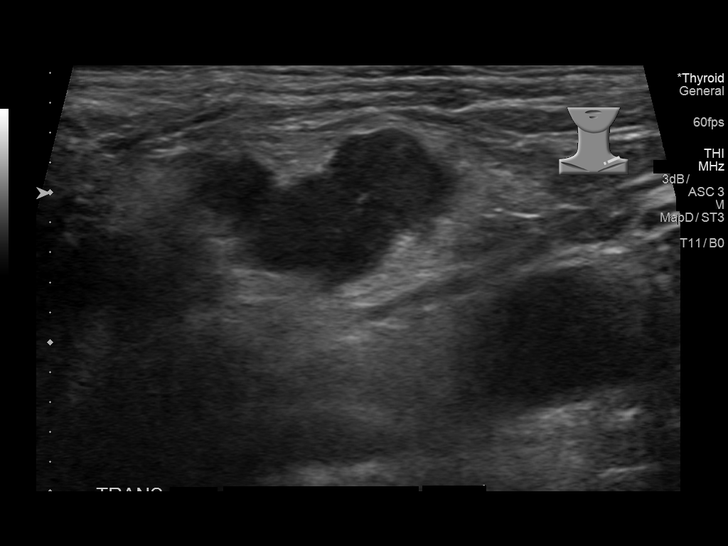
[im 2/12]
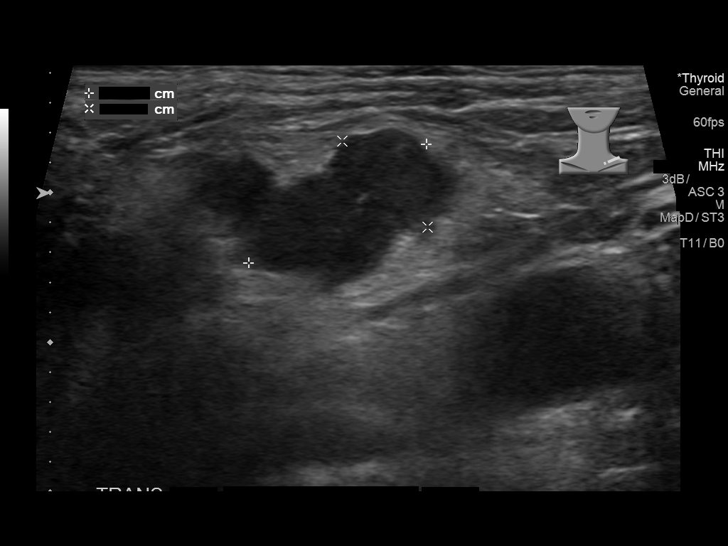
[im 3/12]
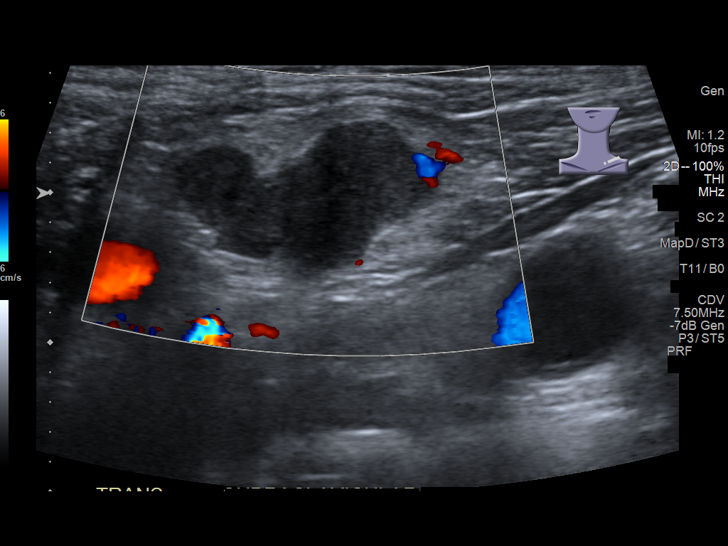
[im 4/12]
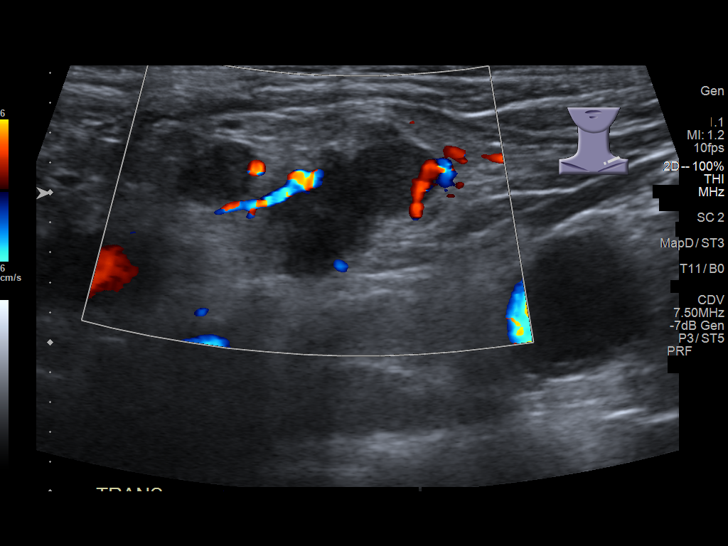
[im 5/12]
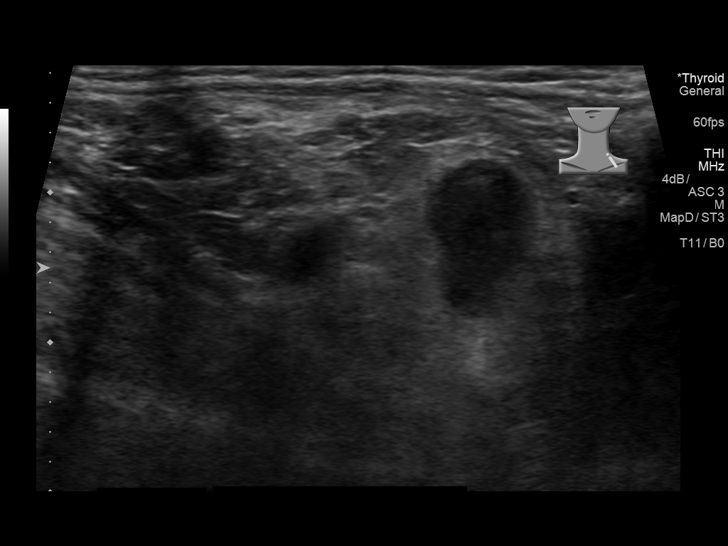
[im 6/12]
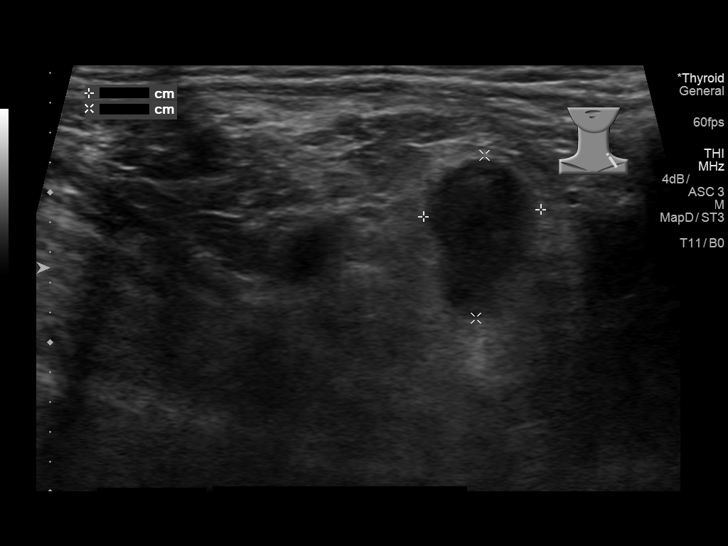
[im 7/12]
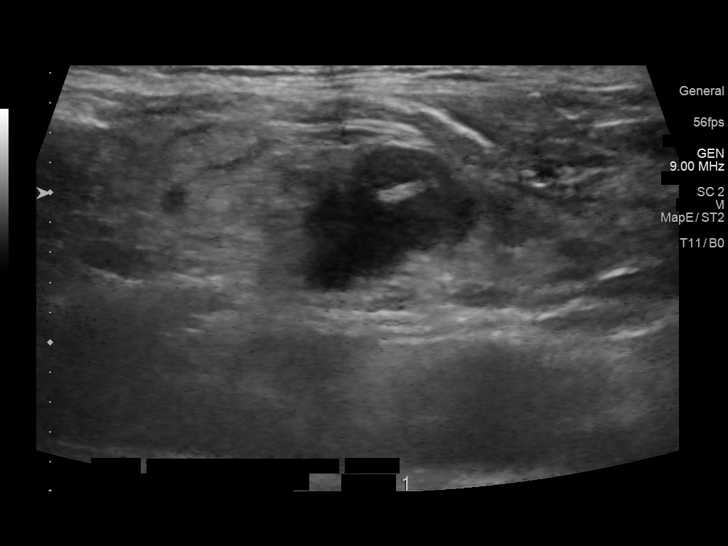
[im 8/12]
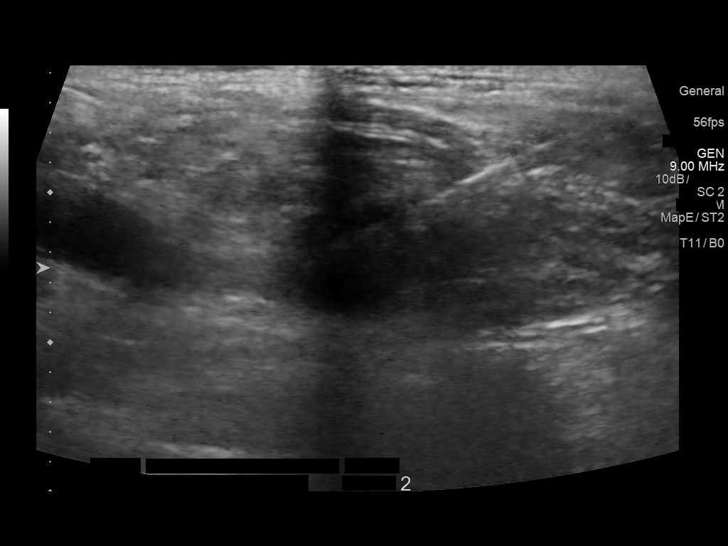
[im 9/12]
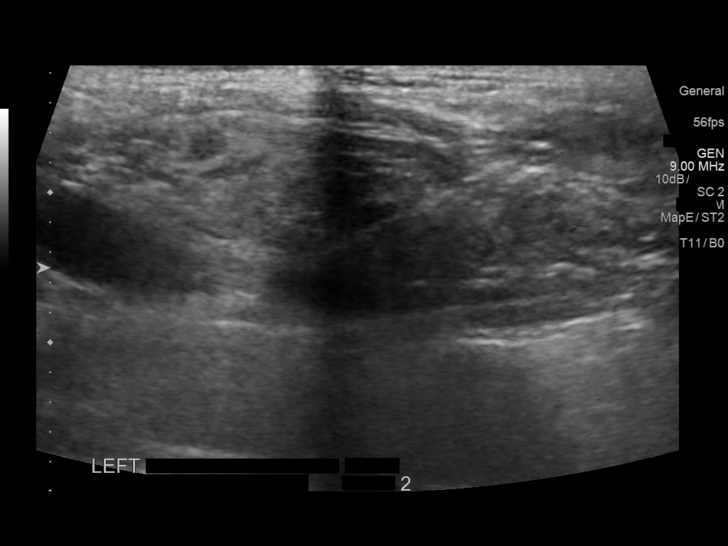
[im 10/12]
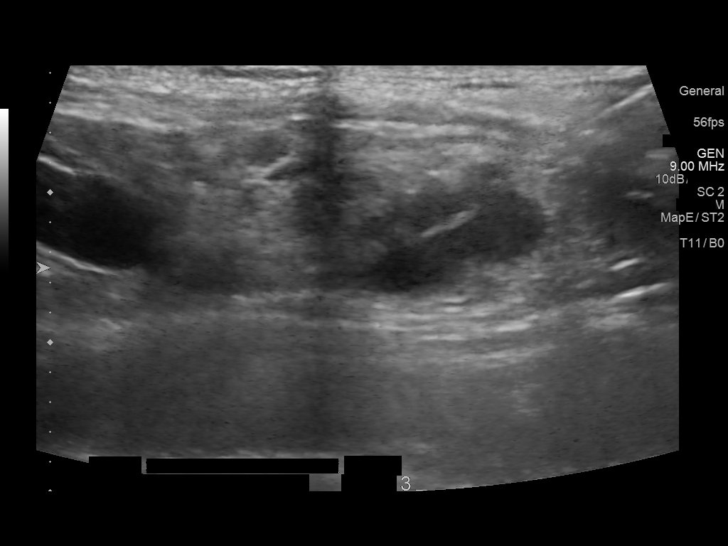
[im 11/12]
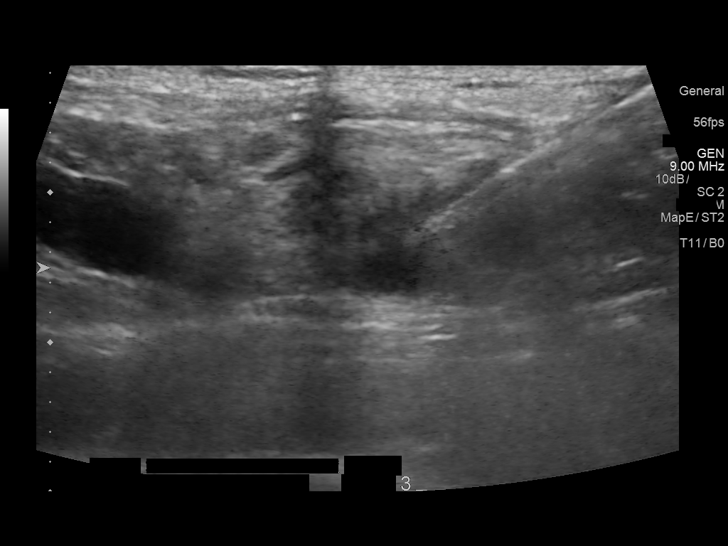
[im 12/12]
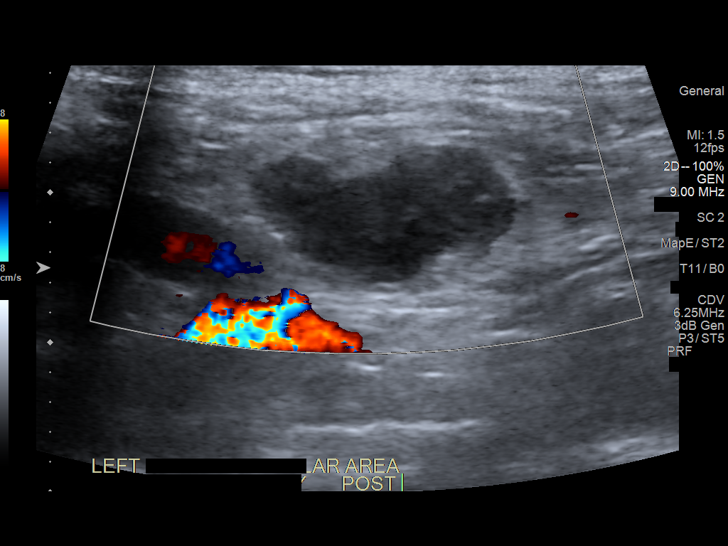

[12 of 12 positions shown; findings below may reference images not displayed]

EXAM:
ULTRASOUND-GUIDED CORE BIOPSY OF LEFT SUPRACLAVICULAR LYMPH NODE

MEDICATIONS:
None.

ANESTHESIA/SEDATION:
Moderate (conscious) sedation was employed during this procedure. A
total of Versed 2.0 mg and Fentanyl 75 mcg was administered
intravenously.

Moderate Sedation Time: 25 minutes. The patient's level of
consciousness and vital signs were monitored continuously by
radiology nursing throughout the procedure under my direct
supervision.

FLUOROSCOPY TIME:  None

COMPLICATIONS:
None immediate.

PROCEDURE:
Informed written consent was obtained from the patient after a
thorough discussion of the procedural risks, benefits and
alternatives. All questions were addressed. A timeout was performed
prior to the initiation of the procedure.

We discussed performing an ultrasound-guided biopsy of either a
supraclavicular lymph node or liver lesion. Both areas were
evaluated with ultrasound. Liver lesions and a prominent left
supraclavicular lymph node were identified. We preferred to biopsy
the left supraclavicular lymph node due to the quicker recovery time
and lower risk of bleeding. Left side of the neck was prepped with
chlorhexidine and a sterile field was created. The skin was
anesthetized with 1% lidocaine. Using ultrasound guidance, 3 core
biopsies were obtained from the nodal tissue in the left
supraclavicular area using an 18 gauge device. Specimens placed in
formalin. Bandage placed over the puncture site.
FINDINGS: Lobulated lymph node or conglomeration of lymph nodes in the left
supraclavicular area measuring up to 1.4 cm. There is at least 1
additional abnormal lymph node in this area. Core biopsies obtained
from the larger lesion. No evidence for bleeding or hematoma
formation following the biopsy. Specimens placed in formalin.
IMPRESSION: Successful ultrasound-guided core biopsies of left supraclavicular
lymph node.
